# Patient Record
Sex: Male | Born: 1955 | Race: White | Hispanic: No | Smoking: Current every day smoker
Health system: Southern US, Community
[De-identification: ages and names within clinical notes are randomized; demographics above are authoritative.]

## PROBLEM LIST (undated history)

## (undated) DIAGNOSIS — F32A Depression, unspecified: Secondary | ICD-10-CM

## (undated) DIAGNOSIS — M549 Dorsalgia, unspecified: Secondary | ICD-10-CM

## (undated) DIAGNOSIS — J449 Chronic obstructive pulmonary disease, unspecified: Secondary | ICD-10-CM

## (undated) DIAGNOSIS — H538 Other visual disturbances: Secondary | ICD-10-CM

## (undated) DIAGNOSIS — G8929 Other chronic pain: Secondary | ICD-10-CM

## (undated) DIAGNOSIS — M1990 Unspecified osteoarthritis, unspecified site: Secondary | ICD-10-CM

## (undated) DIAGNOSIS — M109 Gout, unspecified: Secondary | ICD-10-CM

## (undated) DIAGNOSIS — K219 Gastro-esophageal reflux disease without esophagitis: Secondary | ICD-10-CM

## (undated) DIAGNOSIS — G4730 Sleep apnea, unspecified: Secondary | ICD-10-CM

## (undated) DIAGNOSIS — I10 Essential (primary) hypertension: Secondary | ICD-10-CM

## (undated) DIAGNOSIS — F329 Major depressive disorder, single episode, unspecified: Secondary | ICD-10-CM

## (undated) DIAGNOSIS — E782 Mixed hyperlipidemia: Secondary | ICD-10-CM

## (undated) DIAGNOSIS — C6290 Malignant neoplasm of unspecified testis, unspecified whether descended or undescended: Secondary | ICD-10-CM

## (undated) DIAGNOSIS — K589 Irritable bowel syndrome without diarrhea: Secondary | ICD-10-CM

## (undated) DIAGNOSIS — I2510 Atherosclerotic heart disease of native coronary artery without angina pectoris: Secondary | ICD-10-CM

## (undated) HISTORY — DX: Gout, unspecified: M10.9

## (undated) HISTORY — PX: SURGERY SCROTAL / TESTICULAR: SUR1316

## (undated) HISTORY — DX: Mixed hyperlipidemia: E78.2

## (undated) HISTORY — DX: Other visual disturbances: H53.8

## (undated) HISTORY — DX: Atherosclerotic heart disease of native coronary artery without angina pectoris: I25.10

## (undated) HISTORY — DX: Unspecified osteoarthritis, unspecified site: M19.90

## (undated) HISTORY — DX: Dorsalgia, unspecified: M54.9

## (undated) HISTORY — DX: Irritable bowel syndrome, unspecified: K58.9

## (undated) HISTORY — DX: Chronic obstructive pulmonary disease, unspecified: J44.9

## (undated) HISTORY — DX: Sleep apnea, unspecified: G47.30

## (undated) HISTORY — PX: CHOLECYSTECTOMY: SHX55

## (undated) HISTORY — DX: Other chronic pain: G89.29

## (undated) HISTORY — PX: FOOT SURGERY: SHX648

## (undated) HISTORY — DX: Malignant neoplasm of unspecified testis, unspecified whether descended or undescended: C62.90

---

## 2000-02-17 ENCOUNTER — Ambulatory Visit (HOSPITAL_COMMUNITY): Admission: RE | Admit: 2000-02-17 | Discharge: 2000-02-17 | Payer: Self-pay | Admitting: Radiology

## 2000-02-17 ENCOUNTER — Encounter: Payer: Self-pay | Admitting: Preventative Medicine

## 2000-03-02 ENCOUNTER — Encounter: Payer: Self-pay | Admitting: Preventative Medicine

## 2000-03-02 ENCOUNTER — Ambulatory Visit (HOSPITAL_COMMUNITY): Admission: RE | Admit: 2000-03-02 | Discharge: 2000-03-02 | Payer: Self-pay | Admitting: Preventative Medicine

## 2000-03-16 ENCOUNTER — Encounter: Payer: Self-pay | Admitting: Preventative Medicine

## 2000-03-16 ENCOUNTER — Ambulatory Visit (HOSPITAL_COMMUNITY): Admission: RE | Admit: 2000-03-16 | Discharge: 2000-03-16 | Payer: Self-pay | Admitting: Preventative Medicine

## 2001-03-25 ENCOUNTER — Encounter (HOSPITAL_COMMUNITY): Admission: RE | Admit: 2001-03-25 | Discharge: 2001-04-24 | Payer: Self-pay | Admitting: Oncology

## 2001-03-25 ENCOUNTER — Encounter: Admission: RE | Admit: 2001-03-25 | Discharge: 2001-03-25 | Payer: Self-pay | Admitting: Oncology

## 2001-04-01 ENCOUNTER — Encounter (HOSPITAL_COMMUNITY): Payer: Self-pay | Admitting: Oncology

## 2001-04-26 ENCOUNTER — Encounter: Admission: RE | Admit: 2001-04-26 | Discharge: 2001-04-26 | Payer: Self-pay | Admitting: Oncology

## 2001-04-26 ENCOUNTER — Encounter (HOSPITAL_COMMUNITY): Admission: RE | Admit: 2001-04-26 | Discharge: 2001-05-26 | Payer: Self-pay | Admitting: Oncology

## 2001-06-25 ENCOUNTER — Encounter (HOSPITAL_COMMUNITY): Admission: RE | Admit: 2001-06-25 | Discharge: 2001-07-25 | Payer: Self-pay | Admitting: Oncology

## 2001-06-25 ENCOUNTER — Encounter: Admission: RE | Admit: 2001-06-25 | Discharge: 2001-06-25 | Payer: Self-pay | Admitting: Oncology

## 2001-07-28 ENCOUNTER — Encounter (HOSPITAL_COMMUNITY): Admission: RE | Admit: 2001-07-28 | Discharge: 2001-08-27 | Payer: Self-pay | Admitting: Oncology

## 2001-07-28 ENCOUNTER — Encounter: Admission: RE | Admit: 2001-07-28 | Discharge: 2001-07-28 | Payer: Self-pay | Admitting: Oncology

## 2001-09-30 ENCOUNTER — Encounter (HOSPITAL_COMMUNITY): Payer: Self-pay | Admitting: Oncology

## 2001-09-30 ENCOUNTER — Encounter (HOSPITAL_COMMUNITY): Admission: RE | Admit: 2001-09-30 | Discharge: 2001-10-30 | Payer: Self-pay | Admitting: Oncology

## 2002-04-04 ENCOUNTER — Encounter: Admission: RE | Admit: 2002-04-04 | Discharge: 2002-04-04 | Payer: Self-pay | Admitting: Oncology

## 2002-04-04 ENCOUNTER — Encounter (HOSPITAL_COMMUNITY): Admission: RE | Admit: 2002-04-04 | Discharge: 2002-05-04 | Payer: Self-pay | Admitting: Oncology

## 2002-04-06 ENCOUNTER — Encounter (HOSPITAL_COMMUNITY): Payer: Self-pay | Admitting: Oncology

## 2002-05-18 ENCOUNTER — Ambulatory Visit (HOSPITAL_COMMUNITY): Admission: RE | Admit: 2002-05-18 | Discharge: 2002-05-18 | Payer: Self-pay | Admitting: *Deleted

## 2002-07-07 ENCOUNTER — Encounter: Admission: RE | Admit: 2002-07-07 | Discharge: 2002-07-07 | Payer: Self-pay | Admitting: Oncology

## 2002-07-07 ENCOUNTER — Encounter (HOSPITAL_COMMUNITY): Admission: RE | Admit: 2002-07-07 | Discharge: 2002-08-06 | Payer: Self-pay | Admitting: Oncology

## 2002-10-05 ENCOUNTER — Encounter (HOSPITAL_COMMUNITY): Payer: Self-pay | Admitting: Oncology

## 2002-10-05 ENCOUNTER — Ambulatory Visit (HOSPITAL_COMMUNITY): Admission: RE | Admit: 2002-10-05 | Discharge: 2002-10-05 | Payer: Self-pay | Admitting: Oncology

## 2002-10-10 ENCOUNTER — Encounter (HOSPITAL_COMMUNITY): Admission: RE | Admit: 2002-10-10 | Discharge: 2002-11-09 | Payer: Self-pay | Admitting: Oncology

## 2002-10-10 ENCOUNTER — Encounter: Admission: RE | Admit: 2002-10-10 | Discharge: 2002-10-10 | Payer: Self-pay | Admitting: Oncology

## 2002-12-01 HISTORY — PX: CORONARY ANGIOPLASTY WITH STENT PLACEMENT: SHX49

## 2002-12-05 ENCOUNTER — Encounter: Admission: RE | Admit: 2002-12-05 | Discharge: 2002-12-05 | Payer: Self-pay | Admitting: Oncology

## 2002-12-05 ENCOUNTER — Encounter (HOSPITAL_COMMUNITY): Admission: RE | Admit: 2002-12-05 | Discharge: 2003-01-04 | Payer: Self-pay | Admitting: Oncology

## 2002-12-05 ENCOUNTER — Encounter (HOSPITAL_COMMUNITY): Payer: Self-pay | Admitting: Oncology

## 2003-03-06 ENCOUNTER — Encounter: Admission: RE | Admit: 2003-03-06 | Discharge: 2003-03-06 | Payer: Self-pay | Admitting: Oncology

## 2003-03-06 ENCOUNTER — Encounter (HOSPITAL_COMMUNITY): Admission: RE | Admit: 2003-03-06 | Discharge: 2003-04-05 | Payer: Self-pay | Admitting: Oncology

## 2003-03-17 ENCOUNTER — Encounter: Payer: Self-pay | Admitting: *Deleted

## 2003-03-17 ENCOUNTER — Inpatient Hospital Stay (HOSPITAL_COMMUNITY): Admission: EM | Admit: 2003-03-17 | Discharge: 2003-03-22 | Payer: Self-pay | Admitting: Cardiology

## 2003-04-20 ENCOUNTER — Encounter: Payer: Self-pay | Admitting: Emergency Medicine

## 2003-04-20 ENCOUNTER — Inpatient Hospital Stay (HOSPITAL_COMMUNITY): Admission: EM | Admit: 2003-04-20 | Discharge: 2003-04-22 | Payer: Self-pay | Admitting: Cardiology

## 2003-04-25 ENCOUNTER — Encounter (HOSPITAL_COMMUNITY): Admission: RE | Admit: 2003-04-25 | Discharge: 2003-05-25 | Payer: Self-pay | Admitting: Cardiology

## 2003-06-02 ENCOUNTER — Encounter: Admission: RE | Admit: 2003-06-02 | Discharge: 2003-06-02 | Payer: Self-pay | Admitting: Oncology

## 2003-06-02 ENCOUNTER — Encounter (HOSPITAL_COMMUNITY): Admission: RE | Admit: 2003-06-02 | Discharge: 2003-07-02 | Payer: Self-pay | Admitting: Oncology

## 2003-06-06 ENCOUNTER — Encounter (HOSPITAL_COMMUNITY): Payer: Self-pay | Admitting: Oncology

## 2003-06-14 ENCOUNTER — Ambulatory Visit (HOSPITAL_COMMUNITY): Admission: RE | Admit: 2003-06-14 | Discharge: 2003-06-15 | Payer: Self-pay | Admitting: *Deleted

## 2003-06-23 ENCOUNTER — Encounter (HOSPITAL_COMMUNITY): Admission: RE | Admit: 2003-06-23 | Discharge: 2003-07-23 | Payer: Self-pay | Admitting: *Deleted

## 2003-06-23 ENCOUNTER — Observation Stay (HOSPITAL_COMMUNITY): Admission: AD | Admit: 2003-06-23 | Discharge: 2003-06-23 | Payer: Self-pay | Admitting: Pulmonary Disease

## 2003-08-04 ENCOUNTER — Emergency Department (HOSPITAL_COMMUNITY): Admission: EM | Admit: 2003-08-04 | Discharge: 2003-08-04 | Payer: Self-pay | Admitting: Emergency Medicine

## 2003-08-21 ENCOUNTER — Ambulatory Visit: Admission: RE | Admit: 2003-08-21 | Discharge: 2003-08-21 | Payer: Self-pay | Admitting: Pulmonary Disease

## 2003-09-13 ENCOUNTER — Encounter (HOSPITAL_COMMUNITY): Admission: RE | Admit: 2003-09-13 | Discharge: 2003-10-13 | Payer: Self-pay | Admitting: Oncology

## 2003-09-15 ENCOUNTER — Encounter: Payer: Self-pay | Admitting: Emergency Medicine

## 2003-09-16 ENCOUNTER — Observation Stay (HOSPITAL_COMMUNITY): Admission: EM | Admit: 2003-09-16 | Discharge: 2003-09-16 | Payer: Self-pay | Admitting: Emergency Medicine

## 2003-11-22 ENCOUNTER — Ambulatory Visit (HOSPITAL_COMMUNITY): Admission: RE | Admit: 2003-11-22 | Discharge: 2003-11-22 | Payer: Self-pay | Admitting: Orthopaedic Surgery

## 2003-12-08 ENCOUNTER — Encounter: Admission: RE | Admit: 2003-12-08 | Discharge: 2003-12-08 | Payer: Self-pay | Admitting: Oncology

## 2003-12-08 ENCOUNTER — Encounter (HOSPITAL_COMMUNITY): Admission: RE | Admit: 2003-12-08 | Discharge: 2004-01-07 | Payer: Self-pay | Admitting: Oncology

## 2004-02-11 ENCOUNTER — Inpatient Hospital Stay (HOSPITAL_COMMUNITY): Admission: EM | Admit: 2004-02-11 | Discharge: 2004-02-13 | Payer: Self-pay | Admitting: Emergency Medicine

## 2004-03-16 ENCOUNTER — Emergency Department (HOSPITAL_COMMUNITY): Admission: EM | Admit: 2004-03-16 | Discharge: 2004-03-16 | Payer: Self-pay | Admitting: Emergency Medicine

## 2004-04-24 ENCOUNTER — Ambulatory Visit (HOSPITAL_COMMUNITY): Admission: RE | Admit: 2004-04-24 | Discharge: 2004-04-24 | Payer: Self-pay | Admitting: Pulmonary Disease

## 2004-07-08 ENCOUNTER — Encounter: Admission: RE | Admit: 2004-07-08 | Discharge: 2004-07-08 | Payer: Self-pay | Admitting: Oncology

## 2004-07-08 ENCOUNTER — Encounter (HOSPITAL_COMMUNITY): Admission: RE | Admit: 2004-07-08 | Discharge: 2004-08-07 | Payer: Self-pay | Admitting: Oncology

## 2004-08-27 ENCOUNTER — Ambulatory Visit (HOSPITAL_COMMUNITY): Admission: RE | Admit: 2004-08-27 | Discharge: 2004-08-27 | Payer: Self-pay | Admitting: Pulmonary Disease

## 2004-09-18 ENCOUNTER — Observation Stay (HOSPITAL_COMMUNITY): Admission: EM | Admit: 2004-09-18 | Discharge: 2004-09-19 | Payer: Self-pay | Admitting: Emergency Medicine

## 2004-10-09 ENCOUNTER — Ambulatory Visit (HOSPITAL_COMMUNITY): Admission: RE | Admit: 2004-10-09 | Discharge: 2004-10-09 | Payer: Self-pay | Admitting: Neurology

## 2004-11-15 ENCOUNTER — Ambulatory Visit: Payer: Self-pay | Admitting: *Deleted

## 2005-01-08 ENCOUNTER — Ambulatory Visit (HOSPITAL_COMMUNITY): Admission: RE | Admit: 2005-01-08 | Discharge: 2005-01-08 | Payer: Self-pay | Admitting: Oncology

## 2005-01-10 ENCOUNTER — Encounter (HOSPITAL_COMMUNITY): Admission: RE | Admit: 2005-01-10 | Discharge: 2005-02-09 | Payer: Self-pay | Admitting: Oncology

## 2005-01-10 ENCOUNTER — Encounter: Admission: RE | Admit: 2005-01-10 | Discharge: 2005-01-10 | Payer: Self-pay | Admitting: Oncology

## 2005-01-10 ENCOUNTER — Ambulatory Visit (HOSPITAL_COMMUNITY): Payer: Self-pay | Admitting: Oncology

## 2005-06-21 ENCOUNTER — Ambulatory Visit: Payer: Self-pay | Admitting: Internal Medicine

## 2005-06-21 ENCOUNTER — Observation Stay (HOSPITAL_COMMUNITY): Admission: AD | Admit: 2005-06-21 | Discharge: 2005-06-23 | Payer: Self-pay | Admitting: Internal Medicine

## 2005-07-10 ENCOUNTER — Encounter: Admission: RE | Admit: 2005-07-10 | Discharge: 2005-07-10 | Payer: Self-pay | Admitting: Oncology

## 2005-07-10 ENCOUNTER — Ambulatory Visit: Payer: Self-pay | Admitting: *Deleted

## 2005-07-10 ENCOUNTER — Encounter (HOSPITAL_COMMUNITY): Admission: RE | Admit: 2005-07-10 | Discharge: 2005-08-09 | Payer: Self-pay | Admitting: Oncology

## 2005-07-10 ENCOUNTER — Ambulatory Visit (HOSPITAL_COMMUNITY): Payer: Self-pay | Admitting: Oncology

## 2005-12-08 ENCOUNTER — Ambulatory Visit (HOSPITAL_COMMUNITY): Admission: RE | Admit: 2005-12-08 | Discharge: 2005-12-08 | Payer: Self-pay | Admitting: Oncology

## 2005-12-25 ENCOUNTER — Ambulatory Visit (HOSPITAL_COMMUNITY): Payer: Self-pay | Admitting: Oncology

## 2005-12-25 ENCOUNTER — Encounter: Admission: RE | Admit: 2005-12-25 | Discharge: 2005-12-25 | Payer: Self-pay | Admitting: Oncology

## 2005-12-25 ENCOUNTER — Encounter (HOSPITAL_COMMUNITY): Admission: RE | Admit: 2005-12-25 | Discharge: 2006-01-24 | Payer: Self-pay | Admitting: Oncology

## 2006-06-11 ENCOUNTER — Encounter (HOSPITAL_COMMUNITY): Admission: RE | Admit: 2006-06-11 | Discharge: 2006-07-11 | Payer: Self-pay | Admitting: Oncology

## 2006-06-11 ENCOUNTER — Ambulatory Visit (HOSPITAL_COMMUNITY): Payer: Self-pay | Admitting: Oncology

## 2006-06-11 ENCOUNTER — Encounter: Admission: RE | Admit: 2006-06-11 | Discharge: 2006-06-11 | Payer: Self-pay | Admitting: Oncology

## 2006-07-20 ENCOUNTER — Encounter (HOSPITAL_COMMUNITY): Admission: RE | Admit: 2006-07-20 | Discharge: 2006-08-19 | Payer: Self-pay | Admitting: Oncology

## 2006-07-20 ENCOUNTER — Encounter: Admission: RE | Admit: 2006-07-20 | Discharge: 2006-07-20 | Payer: Self-pay | Admitting: Oncology

## 2007-07-19 ENCOUNTER — Ambulatory Visit (HOSPITAL_COMMUNITY): Payer: Self-pay | Admitting: Oncology

## 2007-07-19 ENCOUNTER — Encounter (HOSPITAL_COMMUNITY): Admission: RE | Admit: 2007-07-19 | Discharge: 2007-08-18 | Payer: Self-pay | Admitting: Oncology

## 2007-12-02 HISTORY — PX: CARDIAC CATHETERIZATION: SHX172

## 2008-02-25 ENCOUNTER — Ambulatory Visit (HOSPITAL_COMMUNITY): Admission: RE | Admit: 2008-02-25 | Discharge: 2008-02-25 | Payer: Self-pay | Admitting: Pulmonary Disease

## 2008-03-06 ENCOUNTER — Ambulatory Visit (HOSPITAL_COMMUNITY): Admission: RE | Admit: 2008-03-06 | Discharge: 2008-03-06 | Payer: Self-pay | Admitting: *Deleted

## 2009-10-15 ENCOUNTER — Ambulatory Visit (HOSPITAL_COMMUNITY): Admission: RE | Admit: 2009-10-15 | Discharge: 2009-10-15 | Payer: Self-pay | Admitting: Pulmonary Disease

## 2009-10-30 ENCOUNTER — Ambulatory Visit (HOSPITAL_COMMUNITY): Admission: RE | Admit: 2009-10-30 | Discharge: 2009-10-30 | Payer: Self-pay | Admitting: Neurology

## 2010-02-04 ENCOUNTER — Ambulatory Visit (HOSPITAL_COMMUNITY): Admission: RE | Admit: 2010-02-04 | Discharge: 2010-02-04 | Payer: Self-pay | Admitting: Pulmonary Disease

## 2010-04-29 ENCOUNTER — Emergency Department (HOSPITAL_COMMUNITY): Admission: EM | Admit: 2010-04-29 | Discharge: 2010-04-29 | Payer: Self-pay | Admitting: Emergency Medicine

## 2010-05-31 ENCOUNTER — Emergency Department (HOSPITAL_COMMUNITY): Admission: EM | Admit: 2010-05-31 | Discharge: 2010-05-31 | Payer: Self-pay | Admitting: Emergency Medicine

## 2010-09-02 ENCOUNTER — Ambulatory Visit (HOSPITAL_COMMUNITY): Admission: RE | Admit: 2010-09-02 | Discharge: 2010-09-02 | Payer: Self-pay | Admitting: Pulmonary Disease

## 2010-12-21 ENCOUNTER — Encounter: Payer: Self-pay | Admitting: Neurology

## 2011-01-23 ENCOUNTER — Other Ambulatory Visit (HOSPITAL_COMMUNITY): Payer: Self-pay | Admitting: Pulmonary Disease

## 2011-01-23 ENCOUNTER — Ambulatory Visit (HOSPITAL_COMMUNITY)
Admission: RE | Admit: 2011-01-23 | Discharge: 2011-01-23 | Disposition: A | Discharge status: Home / Self Care | Payer: BC Managed Care – PPO | Source: Ambulatory Visit | Attending: Pulmonary Disease | Admitting: Pulmonary Disease

## 2011-01-23 DIAGNOSIS — M25512 Pain in left shoulder: Secondary | ICD-10-CM

## 2011-01-23 DIAGNOSIS — M25519 Pain in unspecified shoulder: Secondary | ICD-10-CM | POA: Insufficient documentation

## 2011-04-18 NOTE — H&P (Signed)
NAMEBREVON, DEWALD NO.:  [id number]   MEDICAL RECORD NO.:  [id number]          PATIENT TYPE:  INP   LOCATION:  3705                         FACILITY:  [facility]   PHYSICIAN:  Arvilla Meres, M.D. LHCDATE OF BIRTH:  August 09, 1956   DATE OF ADMISSION:  06/21/2005  DATE OF DISCHARGE:                                HISTORY & PHYSICAL   PRIMARY CARE PHYSICIAN:  Edward L. Juanetta Gosling, M.D.   CARDIOLOGIST:  [name], M.D.   PATIENT IDENTIFICATION:  Mr. Silveria is a 55 year old with multiple medical  problems, including coronary artery disease, status post multiple previous  stents.  Also has a history of noncardiac chest pain, for which he has had  multiple admissions.  He was transferred from [facility] for  further evaluation of progressive chest pain and rash.   HISTORY OF PRESENT ILLNESS:  Mr. Nadal experienced acute inferior wall  myocardial infarction on March 17, 2003.  This was treated with PTCA and  stenting of his mid RCA with a Taxus drug-eluting stent.  At the time, he  also had evidence of significant left circumflex disease and underwent stage  intervention on March 20, 2003.  During this procedure, he had a stent  placed to the mid left circumflex.  This was complicated by a dissection and  abrupt closure requiring bail-out stenting with a Pixel stent x2.  Several  months later, he experienced recurrent chest pain and in July, 2004,  underwent repeat catheterization, which showed 70% stenosis just distal to  the previously placed stents in the left circumflex.  This was angioplastied  and stented by Dr. Chales Abrahams, using Cypher drug-eluting stent.  At that time,  his EF was 55%.  He had just minor disease in the LAD with a 20% stenosis.  There was 30% stenosis in the proximal circumflex and 20-30% stenosis in the  RCA proper.  The RV branch had a 95% stenosis.   This evening, he went to [facility] with several complaints.  He  states  that he has had about a year and a half of exertional chest pain,  which is perhaps slightly progressive.  He denies any rest pain and says he  only gets the chest discomfort with moderate activity such as walking up  hills.  It typically radiates to his jaw and his left arm, and it is  resolved with nitroglycerin.  He is also complaining of a mild rash that he  has on his arms and trunk.  He was evaluated by Dr. Margretta Ditty at [facility].  His EKG was not acute, and his first set of cardiac markers were  negative; however, given the progressive nature of his chest pain and his  response to nitroglycerin, he was transferred here for further evaluation.  While in the ambulate, he did have an episode of recurrent chest pain, which  was resolved with morphine.  He is currently chest painfree.   REVIEW OF SYSTEMS:  He has multiple somatic complaints, including recurrent  rash.  He also notes a chronic history of left-sided facial weakness, for  what he was told he may have had some mini strokes, but he is unsure.  He  also has a history of sleep apnea.  He complains of weakness in his  extremities.  He denies claudication.  He has not had any heart failure  symptoms.  There has been no bleeding, melena, or bright red blood per  rectum.  He has not had any recent fevers or chills, although he does  complain of a lingering upper respiratory tract infection.  The rest of the  review of systems are negative, except as per HPI and problem list.   PROBLEM LIST:  1.  Coronary artery disease:  As per HPI.  2.  History of noncardiac chest pain with multiple admissions.      1.  Adenosine Cardiolite in March, 2005 with an EF of 57% with no          ischemia or scar.  3.  History of testicular cancer.      1.  PET scan in February, 2006, which did not show any recurrent          disease.  4.  Hypertension.  5.  Obstructive sleep apnea, status post UPPT.  6.  Hyperlipidemia.  7.   Thrombocytopenia.  8.  Tobacco use, ongoing, 1-1/2 to 2 packs per day x30 years.  9.  History of left facial numbness x1 year.   CURRENT MEDICATIONS:  1.  Cardizem 180 mg daily.  2.  Naprosyn 500 mg b.i.d.  3.  Zocor 40 mg q.h.s.  4.  Folic acid 1 mg daily.  5.  Potassium 20 mEq b.i.d.  6.  Altace 5 mg daily.  7.  Zyrtec 10 mg daily.  8.  Nitroglycerin p.r.n.  9.  Protonix 40 mg daily.  10. Topamax 25 mg b.i.d.  11. Plavix 75 mg daily.  12. Aspirin 81 mg daily.   He has no known drug allergies.   SOCIAL HISTORY:  He lives in [place].  He is currently on disability.  He  is married and has two children of his own and two stepchildren.  He has  smoked 1-1/2 to 2 packs per day for about 30 years.  He denies significant  alcohol use.   FAMILY HISTORY:  Father had an MI at age 81 and died after a stroke at age  53.  His mother had coronary artery disease in her 59s.   PHYSICAL EXAMINATION:  VITAL SIGNS:  He is afebrile.  Blood pressure is  117/67 with a heart rate of 69.  His respirations are 16.  He is satting 99%  on room air.  GENERAL:  He is in no acute distress.  His respirations are unlabored.  He  is able to ambulate around the room without any difficulty.  HEENT:  Sclerae are anicteric.  EOMI.  There are no xanthelasmas.  Mucous  membranes are moist.  NECK:  Supple.  There is no JVD.  Carotids are 2+ bilaterally.  No carotid  bruits.  There is no adenopathy or thyromegaly.  LUNGS:  Clear to auscultation with a mildly prolonged expiratory phase.  There are no wheezes or rales.  HEART:  Regular rate and rhythm with no murmurs, rubs or gallops.  ABDOMEN:  Soft, nontender, nondistended.  There is no hepatosplenomegaly.  There are good bowel sounds.  There are no obvious masses or bruits.  His  abdominal aortic impulse does not appear widened.  EXTREMITIES:  No clubbing, cyanosis or edema.  Femoral  pulses are 2+ bilaterally without any bruits.  Distal pulses are 2+  bilaterally.  He has  just a fine macular rash on his wrist and the right side of his trunk.  This  appears to be resolving.  NEUROLOGIC:  He is alert and oriented x3.  Cranial nerves II-XII are intact.  He moves all four extremities without difficulty.  His affect is  appropriate.  He is otherwise nonfocal.   LABS:  White count 9.9, hemoglobin 15.8 with a hematocrit of 43.7.  Platelets 146,000.  Sodium 137, potassium 3.3, chloride 109, bicarb 20, BUN  14, creatinine 1.1, glucose 151.  Liver panel is normal.  BNP is less than  30.   EKG shows normal sinus rhythm at a rate of 86 with no ST/T wave changes.   ASSESSMENT/PLAN:  1.  Progressive chest pain:  This is concerning for underlying myocardial      ischemia.  We will admit him for rule-out myocardial infarction and for      cardiac catheterization.  In the interim, we will treat him with      heparin, nitroglycerin, calcium channel blockers, aspirin, Plavix, and      statin.  We will also request smoking cessation consult.  2.  His rash appears mild.  Of unclear etiology but does appear to be      resolving.  We will follow this.       DB/MEDQ  D:  06/22/2005  T:  06/22/2005  Job:  [id number]   cc:   Ramon Dredge L. Juanetta Gosling, M.D.  [address]  [place]  [place] [address]  Fax: [phone]   [name], M.D.  Fax: [phone]

## 2011-04-18 NOTE — Group Therapy Note (Signed)
NAME:  DARROW, BARREIRO                          ACCOUNT NO.:  [id number]   MEDICAL RECORD NO.:  [id number]                   PATIENT TYPE:  INP   LOCATION:  IC08                                 FACILITY:  [facility]   PHYSICIAN:  Edward L. Juanetta Gosling, M.D.             DATE OF BIRTH:  05-17-1956   DATE OF PROCEDURE:  02/13/2004  DATE OF DISCHARGE:                                   PROGRESS NOTE   PROBLEM:  Chest pain.   SUBJECTIVE:  Mr. Kingsley says he has had no more chest pain overnight.  He  is feeling pretty well.  He is not short of breath, has not got any other  complaints.   OBJECTIVE:  His physical exam shows his blood pressure 103/68, pulse is 65.  His chest is clear, his heart is regular.   ASSESSMENT:  He seems to be doing better.   PLAN:  Continue with his treatments and medications and add the stress test  today.      ___________________________________________                                            [name], M.D.   ELH/MEDQ  D:  02/13/2004  T:  02/13/2004  Job:  [id number]

## 2011-04-18 NOTE — Procedures (Signed)
   NAME:  Daniel Fields, Daniel Fields NO.:  [id number]   MEDICAL RECORD NO.:  [id number]                   PATIENT TYPE:  ER   LOCATION:  2905                                 FACILITY:  [facility]   PHYSICIAN:  Edward L. Juanetta Gosling, M.D.             DATE OF BIRTH:  [birth year]   DATE OF PROCEDURE:  03/17/2003  DATE OF DISCHARGE:                                EKG INTERPRETATION   RESULTS:  The rhythm is sinus rhythm with a rate of 70's.  ST elevations  inferior suggestive of an acute inferior myocardial infarction.  There are  also reciprocal changes laterally.  Abnormal electrocardiogram.                                               [name], M.D.    ELH/MEDQ  D:  03/18/2003  T:  03/18/2003  Job:  [id number]

## 2011-04-18 NOTE — Procedures (Signed)
   NAME:  Daniel, Fields NO.:  [id number]   MEDICAL RECORD NO.:  [id number]                   PATIENT TYPE:  INP   LOCATION:  2905                                 FACILITY:  [facility]   PHYSICIAN:  Edward L. Juanetta Gosling, M.D.             DATE OF BIRTH:  1956/01/20   DATE OF PROCEDURE:  03/17/2003  DATE OF DISCHARGE:                                EKG INTERPRETATION   RESULTS:  The rhythm is sinus rhythm with rate of 70.  There is ST elevation  inferiorly suggesting acute myocardial infarction.  Clinical correlation is  suggestive.  Poor R wave progression across the precordium may be indicative  of previous acute anterior wall myocardial infarction and suggestive of  abnormal electrocardiogram.                                               Edward L. Juanetta Gosling, M.D.    ELH/MEDQ  D:  03/18/2003  T:  03/18/2003  Job:  [id number]

## 2011-04-18 NOTE — H&P (Signed)
NAME:  Daniel Fields, Daniel Fields NO.:  [id number]   MEDICAL RECORD NO.:  [id number]          PATIENT TYPE:  INP   LOCATION:  IC09                          FACILITY:  [facility]   PHYSICIAN:  Melvyn Novas, MDDATE OF BIRTH:  11/09/1956   DATE OF ADMISSION:  09/18/2004  DATE OF DISCHARGE:  LH                                HISTORY & PHYSICAL   HISTORY OF PRESENT ILLNESS:  The patient is a 55 year old white male with  history of coronary artery disease, status post stenting x3 in two vessels.  Apparently complains of a 10-day history of dull chest pain radiating to his  neck which persists for two hours, is not associated exertion.  Upon  questioning, responds firmly to associated nausea and dyspnea.  He does  complain of occasional palpitations.  However, he has slurred speech and  difficulty staying awake during the interview.  He denies any antecedent  head trauma.  The CT scan of his head was essentially negative for acute  episode done in the emergency room.  EKG shows poor R wave progression in  the precordium consistent with possible old anteroseptal infarct and he is  admitted for multiple reasons to get serial enzymes and EKGs, obtain an MRI  in the morning and a neurologic work-up to include carotid ultrasound, RPR,  TSH, drug screen and alcohol level tonight to figure out the etiology of his  sedation.  He is oriented in three spheres.   PAST MEDICAL HISTORY:  1.  Coronary artery disease, status post stenting x3 in two different      vessels.  2.  Status post MI.  3.  Hypertension.  4.  Degenerative joint disease.  5.  Gout.  6.  Obstructive sleep apnea.   PAST SURGICAL HISTORY:  Tonsillectomy and UPPP for obstructive sleep apnea.   ALLERGIES:  No known drug allergies.   HABITS:  Smokes one pack per day for 30 years. He does not imbibe alcohol.   CURRENT MEDICATIONS:  1.  Sinemet 25/100 t.i.d.  2.  Xanax 0.5 mg t.i.d.  3.  Altace 5 mg daily.  4.   Allopurinol 300 mg per day.  5.  Plavix 75 mg daily.  6.  Toprol-XL 50 mg per day.  7.  Naprosyn 500 mg b.i.d. p.r.n.  8.  Diltiazem 180 mg daily.  9.  Hydrocodone 5/500 q.i.d. p.r.n.  10. Zocor 40 mg per day.   PHYSICAL EXAMINATION:  VITAL SIGNS:  Blood pressure 113/75, pulse 68 and  regular, respiratory rate 18, temperature 97.7.  HEENT:  Normocephalic, atraumatic.  PERRL.  Extraocular movements intact.  Sclerae clear.  Conjunctivae pink.  The patient reluctantly follows all  commands very slowly.  Throat shows no erythema, no exudates.  NECK:  No carotid bruits.  No thyromegaly.  LUNGS:  Prolongation __________  phase.  No rales, wheezes, rhonchi  appreciable.  HEART:  Regular rhythm.  No murmur, gallops, heaves, thrills, or rubs.  ABDOMEN:  Soft, nontender.  Normal bowel sounds.  No guarding, rebound,  masses or megaly.  EXTREMITIES:  No clubbing, cyanosis or edema.  NEUROLOGIC:  The patient reluctantly follows all commands very slowly.  Cranial nerves II-XII grossly intact.  Finger-to-nose is very poor, both  right and left hand.  Plantar response is downgoing bilaterally.  The  patient moves all four extremities.  Deep tendon reflexes 1+ and symmetric.   IMPRESSION:  1.  Atypical chest pain in the face of significant coronary disease,      multiple stentings.  2.  Sedation with dysarthria of 10 days duration, etiology undetermined.  3.  Hypertension.  4.  Hyperlipidemia.  5.  Sleep apnea.   PLAN:  The plan is to admit and put him on IV nitroglycerin, aspirin and  Plavix.  Will not elect to anticoagulate him at present as I do not feel  this is typical chest pain.  Will get RPR, TSH, carotid ultrasound, repeat  MRI of the head in the morning with contrast.  Hold all sedating medicines  including Xanax, hydrocodone, etc.  Get drug screen and alcohol level at  present and I will make further recommendations as the data base expands.     Rich   RMD/MEDQ  D:  09/18/2004   T:  09/19/2004  Job:  [id number]

## 2011-04-18 NOTE — Group Therapy Note (Signed)
NAME:  Daniel Fields, Daniel Fields                          ACCOUNT NO.:  [id number]   MEDICAL RECORD NO.:  [id number]                   PATIENT TYPE:  INP   LOCATION:  IC08                                 FACILITY:  [facility]   PHYSICIAN:  Edward L. Juanetta Gosling, M.D.             DATE OF BIRTH:  1956/02/10   DATE OF PROCEDURE:  02/12/2004  DATE OF DISCHARGE:                                   PROGRESS NOTE   PROBLEMS:  1. Chest pain.  2. History of coronary artery disease status post myocardial infarction.   SUBJECTIVE:  Daniel Fields says he is okay this morning and has no new  complaints.  He has had no more chest pain today.   OBJECTIVE:  His physical examination shows that his pulse is 67, blood  pressure 113/71, his chest is clear, his heart is regular.  Cardiac panel  thus far negative.  His family says that he was acting funny like he was  high.  He did not have anything on his urine drug screen that would  suggest any illicit drug abuse.  His other laboratory work so far is pretty  unremarkable except for a slightly low sodium and a slightly low potassium.   ASSESSMENT:  He has chest pain.   PLAN:  The plan is to continue with treatments, have him evaluated by the  cardiology team.      ___________________________________________                                            [name], M.D.   ELH/MEDQ  D:  02/12/2004  T:  02/12/2004  Job:  [id number]

## 2011-04-18 NOTE — Cardiovascular Report (Signed)
NAMESHAINE, NEWMARK NO.:  [id number]   MEDICAL RECORD NO.:  [id number]          PATIENT TYPE:  INP   LOCATION:  3705                         FACILITY:  [facility]   PHYSICIAN:  Rollene Rotunda, M.D.   DATE OF BIRTH:  [birth year]   DATE OF PROCEDURE:  06/23/2005  DATE OF DISCHARGE:                              CARDIAC CATHETERIZATION   PRIMARY CARE PHYSICIAN:  Angus G. McInnis, MD   PROCEDURE:  Left heart catheterization/coronary arteriography.   INDICATIONS:  Evaluate patient with chest pain suggestive of unstable  angina. He has had previous stenting to his right coronary artery and  previous stenting to an obtuse marginal of his circumflex.   PROCEDURE NOTE:  Left heart catheterization was performed via the right  femoral artery. The artery was cannulated using anterior wall puncture. A #6  French arterial sheath was inserted via the modified Seldinger technique. A  preformed Judkins' and pigtail catheters were utilized. The patient  tolerated the procedure well and left the lab in stable condition.   RESULTS:   HEMODYNAMICS:  LV 114/25, AO 112/69. Coronaries: The left main was normal.  The LAD was large wrapping the apex. There was a proximal 25% stenosis.  There were diffuse luminal irregularities. The first diagonal was small with  ostial 25%. The second diagonal was small and normal. The circumflex and the  AV groove had a proximal 25% stenosis. There was a ramus intermedius which  was large with ostial 2% stenosis. The mid obtuse marginal with a stent that  was widely patent. The right coronary artery was a large dominant vessel.  There was a proximal mid stent which was widely patent. The PDA was moderate  size and normal. There was an acute marginal with ostial 90% stenosis. This  was unchanged from previous.   LEFT VENTRICULOGRAM:  The left ventriculogram was obtained in the RAO  projection. EF was 65% with normal wall motion.   CONCLUSION:  Mild  residual nonobstructive coronary disease other than the  acute marginal stenosis as described.  The stents are widely patent.   PLAN:  The patient should continue to be managed medically with aggressive  secondary risk reduction.       JH/MEDQ  D:  06/23/2005  T:  06/23/2005  Job:  [id number]   cc:   Angus G. Renard Matter, MD  [address]  [place]  [place] [address]  Fax: [phone]   [facility]  [place], [place]

## 2011-04-18 NOTE — Cardiovascular Report (Signed)
NAME:  BALDWIN, RACICOT                          ACCOUNT NO.:  [id number]   MEDICAL RECORD NO.:  [id number]                   PATIENT TYPE:  INP   LOCATION:  2019                                 FACILITY:  [facility]   PHYSICIAN:  Veneda Melter, M.D.                   DATE OF BIRTH:  [birth year]   DATE OF PROCEDURE:  04/21/2003  DATE OF DISCHARGE:                              CARDIAC CATHETERIZATION   PROCEDURES PERFORMED:  1. Left heart catheterization.  2. Left ventriculogram.  3. Selective coronary angiography.   DIAGNOSES:  1. Coronary atherosclerotic disease.  2. Normal left ventricular systolic function.   HISTORY:  Mr. Frayne is a 55 year old white male with history of tobacco  use who presents for assessment of exertional chest discomfort.  The patient  suffered an inferior wall myocardial infarction on March 17, 2003 and  underwent percutaneous intervention with stent placement of the right  coronary artery.  He had severe residual disease in the left circumflex  artery and underwent stage procedure on March 15, 2003.  A 3.0 x 20-mm Taxus  stent was placed in the RCA and a 2.5 x 18 as well as a 2.5 x 13-mm Pixel  stents were placed in the left circumflex artery.  The patient recovered  well from his procedure.  However, he has had exertional chest pressure and  discomfort with mild numbness in the left arm.  This was similar to his pain  on initial presentation only not as severe. The patient had increase  symptoms leading to admission to [facility] where plan is made for  stress imaging study.  However, due to prolonged episode of pain he was  transferred to [facility] for cardiac catheterization.  He has ruled out  for acute myocardial infarction.   TECHNIQUE:  Informed consent was obtained.  The patient brought to the  catheterization lab.  A 6 French sheath was placed in the right femoral  artery using the modified Seldinger technique.  A 6 [place] JL-4 and  JR-4  catheter was then used to engage the left and right coronary arteries and  selective angiography performed in various projections using manual  injection contrast.  A 6 French pigtail catheter was then advanced in the  left ventricle and a left ventriculogram performed using power injection  contrast.  After termination of this case, the catheters and sheaths were  removed and manual pressure applied until adequate hemostasis was achieved.  The patient tolerated the procedure well and was transferred to the floor in  stable condition.   FINDINGS:   LEFT HEART CATHETERIZATION:  1. Left main trunk:  Large-caliber vessel.  Angiographically normal.  2. LAD:  This is a large-caliber vessel that provides three diagonal     branches.  The LAD has diffuse disease of 20% in the proximal mid     sections.  The distal LAD has  mild irregularities.  The diagonal branches     have mild disease of 30% at their origins.  3. Left circumflex artery:  This is a medium-caliber vessel that consists of     a large marginal branch in the distal section.  The proximal AV     circumflex has moderate tortuosity and has disease of 20-30%.  The large     marginal branch has a previously placed stent in the proximal segment     that is widely patent.  The distal  section of the marginal branch     provides several subbranches.  There is a segment of marginal branch     after the stent and prior to the bifurcation that has moderate     tortuosity.  This pinch appears worse during systole with compromise of     the lumen to approximately 70%.  4. The right coronary artery is dominant.  This is a large-caliber vessel     that provides to posterior descending artery and several posterior     ventricular branches of the terminal segment.  The right coronary artery     has mild lesion in the proximal segment of 20-30%.  There is a stent in     the mid section of the vessel that is widely patent.  The distal  vessel     has mild irregularities.  The RV marginal branch has an ostial narrowing     of 95%.   LEFT VENTRICULOGRAPHY:  1. LV normal end-systolic and end-diastolic dimensions.  2. Overall left ventricular function is well preserved.  3. Ejection fraction greater than 55%.  4. No mitral regurgitation.  5. LV pressure is 90/10.  6. Aortic pressure is 90/60.  7. LVEDP equals 15.   ASSESSMENT AND PLAN:  Mr. Nixon is a 55 year old gentleman status post  stent placement to the right coronary artery as well as large marginal  branch and left circumflex artery.  These segments are widely patent.  There  is compromise of a RV marginal branch from the mid section of the RCA that  will be medically managed.  He also has borderline lesion of the distal  section of the marginal branch which is pinched due to vessel tortuosity and  probable straightening of the vessel due to the stent in the proximal left  segment.   Treatment options were discussed with the patient including percutaneous  intervention versus medical therapy.  At this point, he agrees to proceed  with medical therapy.  Should his symptoms persist or increase percutaneous  intervention with additional stent placement may be necessary.                                               Veneda Melter, M.D.    NG/MEDQ  D:  04/21/2003  T:  04/22/2003  Job:  [id number]   cc:   Ramon Dredge L. Juanetta Gosling, M.D.  [address]  [place]  [place] [address]  Fax: [phone]   [name], M.D.  Fax: [phone]

## 2011-04-18 NOTE — Discharge Summary (Signed)
NAME:  Daniel Fields, Daniel Fields                          ACCOUNT NO.:  [id number]   MEDICAL RECORD NO.:  [id number]                   PATIENT TYPE:  INP   LOCATION:  A218                                 FACILITY:  [facility]   PHYSICIAN:  Edward L. Juanetta Gosling, M.D.             DATE OF BIRTH:  [birth year]   DATE OF ADMISSION:  02/11/2004  DATE OF DISCHARGE:  02/13/2004                                 DISCHARGE SUMMARY   FINAL DISCHARGE DIAGNOSES:  1. Chest pain, myocardial infarction ruled out.  2. History of coronary artery occlusive disease, status post previous     myocardial infarction.  3. History of testicular cancer.  4. Hypertension.  5. Hyperlipidemia.  6. History of thrombocytopenia.  7. Gout.   HISTORY:  Ms. Livermore is a 55 year old who has presented to the emergency  room with chest pain.  He has had this for about 2- days off and on and he  was also somewhat confused according to __________.  He had been urged to  come to the emergency room for evaluation, but resisted it, but eventually  did decided to come to the emergency room and he was seen and treated in the  emergency room, but was not able to be discharged because he continued to  have pain.   PHYSICAL EXAMINATION:  GENERAL:  His physical exam on admission showed that  he was mildly confused.  CHEST:  His chest was fairly clear.  HEART:  His heart was regular.  ABDOMEN:  His abdomen was soft.  EXTREMITIES:  Showed no edema.   HOSPITAL COURSE:  He was treated in the intensive care unit, placed on  anticoagulation and improved. He had no further chest pain after the first  12 hours or so. He had cardiology consultation and was set up for a  Cardiolite stress test which he had and which was normal.  After his stress  test was found to be normal, I discussed this with him and he was discharged  home.  He understands that he may have more chest pain and may have more  problem; but, at this point, we cannot prove that he has any  ongoing cardiac  disease as far as any ongoing ischemia.  He is discharged home.   DISCHARGE MEDICATIONS:  1. Allopurinol 300 mg daily.  2. Aspirin 325 mg daily.  3. Plavix 75 mg daily.  4. Diltiazem 180 mg daily.  5. Cymbalta 60 mg daily.  6. Lopressor 75 mg daily.  7. Protonix 40 mg daily.  8. Zocor 40 mg daily.  9. Xanax 0.5 mg b.i.d. p.r.n.  10.      Lortab q.i.d. p.r.n. pain.   FOLLOW UP:  He is to follow up in my office and follow up with cardiology.     ___________________________________________  Edward L. Juanetta Gosling, M.D.   ELH/MEDQ  D:  02/13/2004  T:  02/14/2004  Job:  [id number]

## 2011-04-18 NOTE — H&P (Signed)
NAME:  Daniel Fields, Daniel Fields NO.:  [id number]   MEDICAL RECORD NO.:  [id number]                  PATIENT TYPE:   LOCATION:                                       FACILITY:   PHYSICIAN:  Angus G. Renard Matter, M.D.              DATE OF BIRTH:   DATE OF ADMISSION:  DATE OF DISCHARGE:                                HISTORY & PHYSICAL   HISTORY OF PRESENT ILLNESS:  A 55 year old white male who states that he had  developed anterior chest pain approximately two days ago which was  intermittent.  It was in the central part of the chest, radiating to the  neck, associated with nausea and weakness.  Apparently these symptoms had  been present since his heart catheterization approximately one year ago.  Apparently he had stents placed with cardiac catheterization at that time.  The patient had been having headaches, slurred speech.  He was seen by ED  physician and evaluated.   A CT of his head was done today and was negative.    The patient was subsequently admitted, rule out unstable angina.   LABORATORY DATA:  CBC, WBC 6100, hemoglobin 16.0, hematocrit 45.1%.  Alcohol  level less than 5 mg/dl.  Chemistry:  Sodium 131, potassium 3.3, chloride  97, cO2 38, glucose 99, BUN 11, creatinine 0.9, calcium 8.3.  CK total 59,  CK-MB 0.7.  Troponin 0.02.  Urinalysis negative.  Drug screen negative.   SOCIAL HISTORY:  The patient is a cigarette smoker.  Denies the use of  alcohol.   FAMILY HISTORY:  See previous record.   PAST MEDICAL HISTORY:  The patient has a history of hypertension,  hyperlipidemia, coronary artery disease, angioplasty and stents placed one  year ago in April following cardiac catheterization.  History of gout.   ALLERGIES:  No known allergies.   MEDICATIONS:  1. Zocor.  2. Plavix.  3. Protonix.  4. Altace.  5. Allopurinol.  6. Diltiazem.  7. Toprol.  8. Aspirin.   REVIEW OF SYSTEMS:  HEENT:  Negative.  CARDIOPULMONARY:  Occasional cough.  Anterior chest pain as noted in HPI.  GI:  No abdominal pain, but  intermittent episodes of nausea.  GU:  No dysuria, hematuria.   PHYSICAL EXAMINATION:  GENERAL:  An alert white male.  VITAL SIGNS:  Blood pressure 117/77, respirations 20, pulse 65.  Temperature  ___.  HEENT:  Eyes:  PERRLA.  TM's are negative.  Oropharynx benign.  NECK:  Supple, no JVD or thyroid abnormalities.  LUNGS:  Clear to P&A.  HEART:  Regular rhythm, no murmurs.  ABDOMEN:  No palpable organs or masses.  SKIN:  Warm and dry.  EXTREMITIES:  Free of edema.  NEUROLOGIC:  No focal deficits.   DIAGNOSES:  1. Coronary artery disease with unstable angina.  2. Impaired mental status of undetermined etiology.     ___________________________________________  Angus G. Renard Matter, M.D.   AGM/MEDQ  D:  02/11/2004  T:  02/11/2004  Job:  [id number]

## 2011-04-18 NOTE — Consult Note (Signed)
NAME:  Daniel Fields, CAUL NO.:  [id number]   MEDICAL RECORD NO.:  [id number]                   PATIENT TYPE:  INP   LOCATION:  IC08                                 FACILITY:  [facility]   PHYSICIAN:  [name], M.D.                DATE OF BIRTH:  [birth year]   DATE OF CONSULTATION:  02/12/2004  DATE OF DISCHARGE:                                   CONSULTATION   CARDIOLOGY CONSULTATION   PRIMARY CARE Lan [place]minger:  [name], M.D.   HISTORY OF PRESENT ILLNESS:  Mr. Daniel Fields is a 55 year old man who I follow  relatively closely with coronary artery disease, hypertension,  hyperlipidemia, history of testicular cancer, and thrombocytopenia who  presented to the ER the day prior to evaluation complaining of chest  discomfort associated with fever, chills, difficulty urinating, nausea, and  feelings of unsteadiness that have occurred over the last couple of days,  most significantly on Saturday about 11:30.  He was riding his brother's  truck when he started to feel very nauseated; previously, that day before,  had some difficulty urinating, but now particularly difficulty with nausea,  a little bit of shortness of breath, some associated fevers, profuse  diaphoresis, and some discomfort in his chest and this waxed and waned over  the course of a period of time.  He presented to the emergency department  after he had gone home, taken some nitroglycerin and the symptoms had  resolved with the nitroglycerin; we were asked to evaluate him.  He is  currently asymptomatic.  He is not having any chest pain at all and is  resting comfortably in the ICU here at [facility]   MEDICATIONS CURRENTLY:  1. Allopurinol 300 mg once a day.  2. Aspirin 325 mg a day.  3. Plavix 75 mg a day.  4. Diltiazem 180 mg a day.  5. Cymbalta 16 mg a day.  6. Lovenox 1 mg/kilogram subcu b.i.d.  7. Lopressor 75 mg a day.  8. Protonix 40 mg a day.  9. __________ 5 mg a day.  10.       Zocor 40 mg a day.  11.      Xanax 0.5 mg b.i.d.  12.      Lortab p.r.n.   ALLERGIES:  He has no known allergies.   SOCIAL HISTORY:  He has continued to smoke even though he has had diagnosed  coronary disease.  He does not consume alcohol, does not use any illicit  drugs.  Lives in [place] with his wife.  His is disabled.   FAMILY HISTORY:  Does have a father who has coronary disease.   PAST MEDICAL HISTORY:  Significant for the hypertension and hyperlipidemia.  History of coronary disease, essentially a myocardial infarction in April  2004 and had a revascularization, acutely of his right coronary artery at  that time.  Subsequently had elective revascularization of  his obtuse  marginal which was found on his initial heart catheterization; and, recently  in July of last year had recurrent chest discomfort, had a residual 70%  stenosis in the obtuse marginal following the stent and then had PCI on this  as well.  Recently studied in July of 2004 which showed an ejection fraction  of 55% and no significant ischemia on a __________ study.  His most recent  heart catheterization was prior to that. He has also had a subsequent  perfusion study which was performed in October of this year after an  admission to the hospital at [facility].  Actually, he was admitted to [facility] for chest pain, back in October and the plan was for an  outpatient Cardiolite which he chose not to undergo.   REVIEW OF SYSTEMS:  He denies any headache, but does have the above other  symptoms.  No productive cough, no hemoptysis, no dysphagia, odynophagia,  melena or hematochezia.   PHYSICAL EXAMINATION:  VITAL SIGNS:  His pulse is 65; his respiratory rate  is 14.  He is 100% saturated on 2 liters nasal cannula. His blood pressure  is 130/70.  HEENT:  Examination of the head, eyes, ears, nose, and throat is  unremarkable.  NECK:  The neck is supple.  There is no jugular venous distention or  carotid  bruits.  CHEST:  His chest is clear to auscultation.  There is normal gas exchange.  CARDIOVASCULAR:  He has a regular rate and rhythm with a normal first and  second heart sounds.  There are no murmurs, rubs, or gallops noted.  ABDOMEN:  His abdomen is soft, nontender, normoactive bowel sounds.  GENITOURINARY AND RECTAL:  Exams are deferred.  EXTREMITIES:  Are without significant edema.  He has normal pulses  bilaterally.  NEUROLOGIC:  Neurologic exam to my evaluation is grossly intact.   EKG:  Ventricular rate 61, sinus rhythm, first degree AV block with a PR  interval of 216 milliseconds. The remainder of his intervals are normal.  He  has no ST-T wave changes concerning for ischemia.  He does have a Q wave in  lead III and a Q wave in AVF consistent with his old inferior wall  myocardial infarction, but no active ST-T wave changes concerning for  ischemia and this is compared to his admission and previous EKGs, there is  no significant change.   CHEST X-RAY:  His chest x-ray shows no significant acute, cardiopulmonary  disease.   LABORATORIES:  His white blood cell count is 6000 with an H&H of 16 and 45  with a platelet count of 128,000.  Sodium 131, potassium 3.3, chloride 97,  bicarb 28, BUN 11, creatinine 0.9, his blood sugar 99. He has 3 sets of  cardiac enzymes which are negative for acute myocardial infarction.  His PT  is 12.4, PTT is 31.  His urine drug screen shows positive for  benzodiazepines and opiates which he is prescribed both.   ASSESSMENT:  So my assessment is that this a gentleman who has chest pain  with known coronary artery disease.  His chest pain syndrome is  characteristic for his previous episodes of pain.  He does have the known  coronary disease, but he has had multiple evaluations in the past for  similar symptoms which have shown no evidence of ischemia.  Dysuria, however, is quite concerning.  This is a problem.  He has got the history  of  testicular  cancer.  He is followed by Dr. Jerre Simon for this problem.  From  what I understand, on talking with him, he has recently had his bladder  evaluated within the last year without any trouble; however, he has  concerning symptoms from the mental status changes and I wonder if this may  have exacerbated the episode of discomfort in his chest.  I think that it is  reasonable, at this point, from our point of view to evaluate his ischemic  burden with an adenosine Cardiolite which we will get scheduled tomorrow.  I  would stop his Lovenox, at this point, as it does not appear to be making a  significant difference.   The question becomes whether to get a urology consultation which I think is  probably reasonable as well as a neurologic consultation for the evaluation  of these mental status changes.  I will leave that up to his primary care  physician.  We will leave him on his other medications.      ___________________________________________                                            [name], M.D.   JH/MEDQ  D:  02/12/2004  T:  02/12/2004  Job:  [id number]

## 2011-04-18 NOTE — Discharge Summary (Signed)
NAME:  Daniel Fields, Daniel Fields                          ACCOUNT NO.:  [id number]   MEDICAL RECORD NO.:  [id number]                   PATIENT TYPE:  INP   LOCATION:  2019                                 FACILITY:  [facility]   PHYSICIAN:  Olga Millers, M.D.                DATE OF BIRTH:  28-Mar-1956   DATE OF ADMISSION:  04/20/2003  DATE OF DISCHARGE:  04/22/2003                           DISCHARGE SUMMARY - REFERRING   PROCEDURES:  Coronary angiogram, Apr 21, 2003.   REASON FOR ADMISSION:  Please refer to the dictated admission note.   LABORATORY DATA:  Sodium 139, potassium of 3.4, glucose 114, BUN 14,  creatinine 0.9.  Normal liver enzymes.  Normal WBC and hemoglobin with  decreased platelets of 103.  Normal cardiac markers (times two).  Lipid  profile:  Total cholesterol 94, triglycerides 185, HDL 29, LDL 28.  Negative  urinalysis.   Admission CXR:  No active disease.   HOSPITAL COURSE:  Following initial presentation to [facility]  emergency room for evaluation of recurrent chest pain, patient was  stabilized and transferred to [facility] for further evaluation.   Subsequent serial cardiac enzymes were all within normal limits.  Patient  was maintained on intravenous nitroglycerin and Lovenox with plans to  proceed with a diagnostic coronary angiogram.   Patient underwent coronary angiography, performed by Dr. Chales Abrahams, on Apr 21, 2003, (see attached report for full details), revealing widely patent stent  sites of both the CFX and RCA.  There was, however, a 70% lesion in the CFX,  just distal to the stent site.  No significant LAD disease noted.  LV gram  was normal.   Dr. Chales Abrahams recommended continued medical management and consideration of PCI  of the OM lesion if patient were to have persistent pain.   Patient was kept for overnight observation, cleared for discharge the  following morning in hemodynamically stable condition.  Right groin was  stable on examination.   At discharge, patient was instructed to remain on Plavix for the full six  months.  He had had recent stenting in April 2004.  Aspirin was cut down to  a baby dose, and patient was also placed on Protonix.   Mild hypokalemia was supplemented prior to discharge.   DISCHARGE MEDICATIONS:  1. Protonix 40 mg daily.  2. Plavix 75 mg daily (six months total).  3. Aspirin 81 mg daily.  4. Toprol XL 25 mg daily.  5. Zocor 40 mg q.h.s.  6. Altace 5 mg daily.  7. Nitrostat 0.4 mg p.r.n.    DISCHARGE INSTRUCTIONS:   ACTIVITY:  No heavy lifting/driving times two days.   DIET:  Low-fat/cholesterol diet.   SPECIAL INSTRUCTIONS:  1. Call the office if there is any swelling/bleeding in the groin.  2. Patient was instructed to stop smoking tobacco.   FOLLOW UP:  Patient is instructed to follow up with his primary  cardiologist, Dr. [name], at [facility], in  approximately four weeks; arrangements will be made through the office.  Of  note, patient was scheduled for a Cardiolite next Tuesday, which will be  cancelled and reconsidered and rescheduled for a later date, pending  followup and further evaluation.   DISCHARGE DIAGNOSES:  1. Nonischemic chest pain.     A. Normal serial cardiac enzymes.     B. Widely patent stent, circumflex and right coronary artery sites, with        a 70% distal obtuse marginal stenosis - medical therapy recommended.     C. Normal left ventricle.     D. Status post acute inferior myocardial infarction/stent to right        coronary artery with subsequent elective stent, obtuse marginal -        April 2004.  2. Dyslipidemia.  3. Hypertension.  4. Tobacco.  5. Mild thrombocytopenia.  6. Mild hypokalemia.     Gene Serpe, P.A. [facility]                      Olga Millers, M.D.    GS/MEDQ  D:  04/22/2003  T:  04/22/2003  Job:  [id number]   cc:   Ramon Dredge L. Juanetta Gosling, M.D.  [address]  [place]  [place] [address]  Fax: [phone]

## 2011-04-18 NOTE — Discharge Summary (Signed)
NAME:  Daniel Fields, Daniel Fields                          ACCOUNT NO.:  [id number]   MEDICAL RECORD NO.:  [id number]                   PATIENT TYPE:  INP   LOCATION:  2010                                 FACILITY:  [facility]   PHYSICIAN:  Olga Millers, M.D.                DATE OF BIRTH:  [birth year]   DATE OF ADMISSION:  09/15/2003  DATE OF DISCHARGE:  09/16/2003                           DISCHARGE SUMMARY - REFERRING   DISCHARGE DIAGNOSIS:  Admission with palpitations and chest pain accompanied  by dyspnea and feeling of fatigue.   SECONDARY DIAGNOSES:  1. History of coronary artery disease, status post myocardial infarction,     April of 2004, with four catheterizations since and multiple stent     placements.  2. Hypertension.  3. Dyslipidemia.  4. History of testicular cancer.  5. History of nodular opacities in the left lung.  6. History of percutaneous coronary intervention to the left circumflex and     to the right coronary artery.   PROCEDURES:  No procedures, this admission, however, his medication therapy  was changed; he was increased to Toprol 75 mg daily from 50 mg daily.  Cardiac enzymes x3 on admission to the emergency room were all negative.   DISCHARGE DISPOSITION:  Daniel Fields ready for discharge, September 16, 2003.  Since his admission to [facility], he has not had any more  of these palpitations which cause skipping and pain, thought to be related  to premature ventricular contractions.  His admission electrocardiogram does  show sinus rhythm with occasional PVCs.  The patient is stable.  He has been  in sinus rhythm, this hospitalization, afebrile, ready for discharge,  September 16, 2003, and goes home with the following medications:   DISCHARGE MEDICATIONS:  1. Enteric-coated aspirin 81 mg daily.  2. Plavix 75 mg daily.  3. Protonix 40 mg daily.  4. Altace 5 mg daily.  5. Toprol 75 mg daily -- one and one-half tabs of the 50 mg prescription.  6.  Allopurinol 300 mg daily.  7. Fish oil capsules 1000 mg twice daily.   DISCHARGE DIET:  Low sodium, low cholesterol.   FOLLOWUP:  He will follow up with Dr. [name] at the [place]  office in one week; Dr. Marchelle Folks office will call with that appointment.   BRIEF HISTORY:  Daniel Fields is a 55 year old male with past history of  coronary artery disease, status post stents as related above, history of  hypertension, dyslipidemia and testicular cancer.  He is complaining of  palpitations which cause chest pain and some dyspnea.  He had PCI of the  right coronary artery in April 2004 with finding of inferior myocardial  infarction.  He then subsequently had a PCI of the obtuse marginal in July  of 2004.  A Cardiolite was done on June 23, 2003; it showed ejection  fraction of 55% without evidence of ischemia.  The patient now presents with  occasional palpitations, a skip with associated pain; he also complains of  dyspnea on exertion but no exertional chest pain.  The symptoms are atypical  and most likely related to his PVCs.  He will be admitted to rule out  myocardial infarction and followed on telemetry.  If he has no significant  arrhythmia, he will be discharged the following morning and follow up with  Dr. Dorethea Clan.   PLAN:  1. Discharge September 16, 2003.  2. Follow up with Dr. Dorethea Clan in one week; our office will call.  3. Consider event monitor if symptoms persist.  4. Consider repeat Cardiolite study if symptoms persist.      Maple Mirza, P.A.                    Olga Millers, M.D.    GM/MEDQ  D:  09/16/2003  T:  09/16/2003  Job:  [id number]

## 2011-04-18 NOTE — Procedures (Signed)
NAME:  Daniel Fields, PEMBER NO.:  [id number]   MEDICAL RECORD NO.:  [id number]                   PATIENT TYPE:  INP   LOCATION:  IC08                                 FACILITY:  [facility]   PHYSICIAN:  [name], M.D.                DATE OF BIRTH:  06-18-1956   DATE OF PROCEDURE:  DATE OF DISCHARGE:                                    STRESS TEST   PROCEDURE:  Adenosine Cardiolite.   INDICATION:  Mr. Heavin is a 55 year old male with known coronary artery  disease with last catheterization in July of 2004 at which time he had a  stent placed to his marginal and circumflex artery.  He had a subsequent  Cardiolite at the end of July 2004 revealing no ischemia and an ejection  fraction of 55%.  He now presents with recurrent atypical chest discomfort.  Cardiac enzymes are negative x3 for acute myocardial infarction.   BASELINE DATA:  EKG reveals sinus rhythm at 61 beats per minute with  nonspecific ST abnormalities.  Blood pressure 108/62.   Adenosine 50 mg was infused over a four minute protocol with Cardiolite  injected at three minutes.  The patient reported chest pain, shortness of  breath, and nausea which resolved in recovery.  EKG revealed a few PAC's and  no ischemic changes.   FINAL IMAGES AND RESULTS:  Pending M.D. review.     ________________________________________  ___________________________________________  Jae Dire, P.A. [facility]                      [name], M.D.   AB/MEDQ  D:  02/13/2004  T:  02/13/2004  Job:  [id number]

## 2011-04-18 NOTE — Cardiovascular Report (Signed)
NAME:  Daniel Fields, Daniel Fields NO.:  [id number]   MEDICAL RECORD NO.:  [id number]                   PATIENT TYPE:  INP   LOCATION:  2012                                 FACILITY:  [facility]   PHYSICIAN:  Arturo Morton. Riley Kill, M.D. [facility]         DATE OF BIRTH:  Jul 24, 1956   DATE OF PROCEDURE:  DATE OF DISCHARGE:                              CARDIAC CATHETERIZATION   INDICATIONS:  The patient is a very nice 55 year old gentleman who has had  prior metastatic seminoma.  He has been in remission.  There has been a  question of pulmonary nodules, but these have been resolved.  On Friday of  last week, he presented with an acute inferior wall infarction and had a  subtotal occlusion of the right coronary artery.  This was stented using a  Taxus drug-eluding stent.  At that time he was noted to have an additional  90% plus stenosis in the circumflex coronary artery.  It was felt that he  should be brought back to the laboratory for elective dilatation of this  lesion.  Informed consent was obtained, and the patient was brought to the  catheterization laboratory for further evaluation.   PROCEDURE:  Percutaneous stenting of the right coronary artery.   DESCRIPTION OF PROCEDURE:  The patient was brought to the catheterization  lab, prepped and draped in the usual fashion.  Through an anterior puncture,  the left femoral artery was easily entered.  The patient had been on  clopidogrel, and therefore bivalirudin was used as the anticoagulant.  Two  views of the right coronary artery were obtained, which demonstrated a wide  patency at the site of recent stenting during the setting of an acute  infarction.  We initially used a 3.57-French Voda guide.  This was exchanged  for a JL3.5 guiding catheter, and we used a BMW wire.  However, we had some  difficulty passing the wire down the vessel due to the extensive tortuosity  and the multiple angles.  As a result, we went back to  the P[place] guide and we  were able to get good seating, and we used a Radio producer.  With adequate anticoagulation, a 2.5-mm CrossSail balloon was passed to the  site of the lesion.  A 6-atmosphere inflation was performed with the  undersized balloon.  There was relief of the residual stenosis, but as the  balloon was let down there was evidence of an antegrade dissection with  staining and abrupt vessel closure.  With this, an attempt was made to pass  a 2.5-mm x 18 Guidant pixel stent as it was thought that this stent had the  best chance of tracking down the vessel.  We were able to get it over the  lesion site, and subsequently a 15-atmosphere inflation was performed.  With  this, there was reestablishment of TIMI-3 flow, relief of pain.  There  continued to be  extra vessel staining distal to the stent site, but no  impairment of runoff suggesting that the dissection site itself was covered.  Over time, the vessel continued to improve.  Dr. Samule Ohm and I reviewed the  films.  It was felt that a second stent would be appropriate to place just  distal to the first one.  A second 2.5 x 13 pixel stent was then placed  distal to the first stent.  This was carefully positioned.  This was then  taken up to about 10 atmospheres.  The balloon slightly pulled back, and  inflated again to 14 atmospheres.  There appeared to be excellent runoff and  perhaps some foreshortening in the artery distal to the stent site, but  there was excellent TIMI-3 runoff.  Several 15 atmosphere inflations were  performed in the overlap zone of the 2 stents.  There was also a  pseudostenosis noted proximally due to the wire, and this was relieved once  the wire was removed.  Multiple final followup views over 10 to 15 minutes  revealed continued patency of the artery with good runoff.  At this point,  it was felt that the procedure should be discontinued.  All catheters were  removed and the femoral  sheath was sewn into place.   ANGIOGRAPHIC DATA:  1. The right coronary artery had a previous high-grade stenosis and was     stented on Friday.  At the current time, the vessel was widely patent at     the stented site without vessel compromise and with TIMI-3 flow.  2. The circumflex coronary artery demonstrates marked tortuosity.  In the     mid vessel, there is a 90% to 95% stenosis.  Following initial balloon     dilatation, the stenosis was relieved, but there was an antegrade     dissection with vessel staining and abrupt closure.  Following stenting     of this entire area, the vessel staining was no longer present.  There     was excellent TIMI-3 flow with no residual stenosis.  There was some mild     narrowing just distal to the distal stent site, but it did not appear to     be flow limiting, and given the location it was felt that further     attempts to stent this area should not be performed.   The final films were reviewed with both the patient and his family in  detail.   CONCLUSIONS:  1. Continued patency of the previously stented right coronary artery.  2. Successful percutaneous stenting of the circumflex coronary artery with     initial balloon dilatation complicated by antegrade vessel dissection     with an abrupt closure, with bail-out stenting.   DISPOSITION:  The patient will need to remain on aspirin and Plavix.  He has  very mild thrombocytopenia but appears to be stable.  Followup will be with  Dr. [place] Bing.  Oncologic followup will be with Dr. Ladona Horns. Neijstrom.                                               Arturo Morton. Riley Kill, M.D. [facility]    TDS/MEDQ  D:  03/20/2003  T:  03/22/2003  Job:  [id number]   cc:   [place] Bing, M.D. [facility] S. Mariel Sleet, MD  [address]  [place]  [place] [address]  Fax: [phone]   [facility]

## 2011-04-18 NOTE — Group Therapy Note (Signed)
NAME:  Daniel Fields, Daniel Fields NO.:  [id number]   MEDICAL RECORD NO.:  [id number]                    PATIENT TYPE:  PREC   LOCATION:                                       FACILITY:   PHYSICIAN:  [name], M.D.                DATE OF BIRTH:  11-14-1956   DATE OF PROCEDURE:  DATE OF DISCHARGE:                                   PROGRESS NOTE   PROCEDURE:  Exercise Cardiolite   INDICATIONS:  This patient is a 55 year old male with known coronary artery  disease, status post inferior myocardial infarction with 99% occlusion of  the RCA, 95% occlusion of the obtuse marginal with treatment with stent of  the RCA and circumflex in April 2004.  In May of 2004 repeat catheterization  revealed patent stents and compromised RV marginal branch from the  midsection of the RCA.  Medical treatment was recommended.   On Apr 25, 2003 a Cardiolite revealed no ischemia.  The patient continued to  have angina.  Repeat catheterization was done in July 2004.  Stent to the  left circumflex was done.  He returned to our office yesterday with  complaints of continued chest discomfort, 1 episode severe enough to take 1  sublingual nitroglycerin with relief of pain.   BASELINE DATA:  EKG shows sinus rhythm at 74 beats/minute with blood  pressure of 130/70.  This EKG has not changed from previous EKG done in  office yesterday.   The patient exercised for a total of 10 minutes to Bruce protocol stage 3;  stage was held secondary to fatigue.  He exercised up to 10.1 METS.  He  complained of chest pressure with burning in his substernal region starting  at about 1 minute of exercise.  This increased to 5/10 chest discomfort with  shortness of breath.  He was watched in recovery for approximately 20  minutes and was continuing to have 2/10 chest discomfort.  At that time he  was given 1 sublingual nitroglycerin with relief of pain down to 1/10 pain.  Five minutes later he was  given a second sublingual nitroglycerin with  complete relief of pain.  His maximum heart rate during exercise was 133  beats/minute which is 77% of predicted maximum.  His maximum blood pressure  was 158/80.  His EKG showed no ischemic changes and no arrhythmias.   The patient was admitted to the hospital for further evaluation of chest  discomfort.  We will obtain serial cardiac enzymes and EKGs. He has been  continued on his home medical regimen. He will be given nitroglycerin p.r.n.  chest discomfort.   Final images and results of this exercise Cardiolite are pending MD review.     Amy Mercy Riding, P.A. [facility]                     [name], M.D.  AB/MEDQ  D:  06/23/2003  T:  06/23/2003  Job:  [id number]

## 2011-04-18 NOTE — H&P (Signed)
NAME:  Daniel Fields, ZEMAN NO.:  [id number]   MEDICAL RECORD NO.:  [id number]                   PATIENT TYPE:  EMS   LOCATION:  MAJO                                 FACILITY:  [facility]   PHYSICIAN:  Olga Millers, M.D.                DATE OF BIRTH:  1956/07/08   DATE OF ADMISSION:  09/15/2003  DATE OF DISCHARGE:                                HISTORY & PHYSICAL   HISTORY OF PRESENT ILLNESS:  Daniel Fields is a 55 year old male with a past  medical history of coronary artery disease, hypertension, hyperlipidemia,  history of testicular cancer, thrombocytopenia, who presents with  palpitations and chest pain.  The patient presented with an acute inferior  myocardial infarction in April of 2004, and had PCI of his right coronary  artery at that time.  He subsequently had elective PCI of his obtuse  marginal.  A recent catheterization revealed a residual 70% obtuse marginal  following the stent, and he a PCI at that time.  His most recent nuclear  study was performed on June 23, 2003, and showed an ejection fraction of  55%, and no ischemia.  The patient now presents with palpitations and chest  pain.  He states that he has had intermittent skipping since his  myocardial infarction.  Associated with the skipping, he describes a pain  that lasts for the duration of the skip, and then resolves.  There can be  nausea and shortness of breath, but there is no diaphoresis.  He also has  dyspnea on exertion which is a Crohn's disease problem, but there is no  orthopnea, PND, or pedal edema.  Because of these symptoms, he presented to  the emergency room, and were asked to further evaluate.   MEDICATIONS:  1. Toprol 75 mg p.o. daily.  2. Aspirin 81 mg p.o. daily.  3. Plavix 75 mg p.o. daily.  4. Zocor 40 mg p.o. q.h.s.  5. Protonix 40 mg p.o. daily.  6. Altace 5 mg p.o. daily.  7. Allopurinol 300 mg p.o. daily.  8. He also takes Cardizem 180 mg p.o. daily.   ALLERGIES:  No known drug allergies.   SOCIAL HISTORY:  He does smoke.  He does not consume alcohol.   FAMILY HISTORY:  Positive for coronary artery disease.   PAST MEDICAL HISTORY:  Significant for hypertension and hyperlipidemia, but  there is no diabetes mellitus.  He does have a history of coronary disease  as outlined in the HPI.  There is also a history of testicular cancer, and  he has been treated for that.  He has a history of thrombocytopenia.  He  also has a history of peptic ulcer disease.  He has had prior  cholecystectomy, as well as a tonsillectomy.   REVIEW OF SYSTEMS:  He denies any headaches, fevers, or chills.  There is no  productive cough or hemoptysis.  There is  no dysphagia, odynophagia, melena,  or hematochezia.  There is no dysuria or hematuria.  There is no rash or  seizure activity.  He denies any orthopnea, PND, or pedal edema.  There is  dyspnea on exertion.  The remaining systems are negative.   PHYSICAL EXAMINATION:  VITAL SIGNS:  Blood pressure of 135/92, pulse 101.  He is afebrile.  GENERAL:  He is well-developed and well-nourished, in no acute distress.  He  did not appear to be depressed.  SKIN:  Warm and dry.  HEENT:  Unremarkable with no mileage.  SKIN:  There was no peripheral clubbing.  NECK:  Supple with normal upstroke bilaterally, and there were no bruits  noted.  There was no jugular venous distention, no thyromegaly noted.  CHEST:  Clear to auscultation.  Normal air exchange.  CARDIOVASCULAR:  Regular rate and rhythm with normal S1 and S2.  There are  no murmurs, rubs, or gallops noted.  ABDOMEN:  Nontender, nondistended.  Positive bowel sounds.  No  hepatosplenomegaly.  No masses appreciated.  There is no abdominal bruit.  He has 2+ femoral pulses bilaterally, and there are no bruits.  EXTREMITIES:  No edema, and I can palpate no cords.  He has 2+ dorsalis  pedis pulses bilaterally.  NEUROLOGIC:  Grossly intact.   His  electrocardiogram shows normal sinus rhythm with no acute ST changes.  His hemoglobin and hematocrit are 15 and 45.  His potassium is low at 3.2.  His initial enzymes are negative.   DIAGNOSES:  1. Atypical chest pain.  2. Palpitations.  3. History of coronary disease.  4. Hypertension.  5. Hyperlipidemia.  6. History of testicular cancer.   PLAN:  [name] presents with atypical chest pain and palpitations that  sound to be premature ventricular contractions.  He describes these as a  skip.  We will admit to rule out myocardial infarction, although I think  this is unlikely.  We will follow telemetry.  If the above is negative, and  there is no significant arrhythmia documented by telemetry, then we will  plan to discharge in the morning, and he will follow up with Dr. Dorethea Clan for  consideration of an event monitor and need for repeat Cardiolite.  Given his  increasing palpitations, I will discontinue his Cardizem, and we will  increase his Toprol to 75 mg p.o. daily (he was on 50 mg p.o. daily  previously).  We will make further recommendations once we have the above  information.                                                Olga Millers, M.D.    BC/MEDQ  D:  09/16/2003  T:  09/16/2003  Job:  [id number]
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
## 2011-04-18 NOTE — Discharge Summary (Signed)
NAME:  Daniel Fields, Daniel Fields NO.:  [id number]   MEDICAL RECORD NO.:  [id number]                   PATIENT TYPE:  INP   LOCATION:  2012                                 FACILITY:  [facility]   PHYSICIAN:  Jesse Sans. Wall, M.D. [facility]            DATE OF BIRTH:  05-Feb-1956   DATE OF ADMISSION:  03/17/2003  DATE OF DISCHARGE:  03/22/2003                                 DISCHARGE SUMMARY   DISCHARGE DIAGNOSES:  1. Status post inferior ST elevation myocardial infarction.     a. Treated with emergent PTCA and stenting to the right coronary artery        with a Taxus stent.  2. Coronary artery disease.     a. Cardiac catheterization on 03/17/2003, revealing left main normal.        Left anterior descending with proximal 25% stenosis and luminal        irregularities. Circumflex proximal AV groove with luminal        irregularities. MOM large with proximal 95% stenosis. Right coronary        artery dominant with a mid, long, 99% stenosis with TIMI-1 flow. Left        ventricular ejection fraction with inferior basilar akinesis and        ejection fraction of 60%.     b. Right coronary artery stenting, as noted above.     c. Stenting to the circumflex on 03/20/2003, complicated by dissection        with abrupt vessel closure. Vessel rescued with overlapping 2.5 Pixel        stent through significant vessel tortuosity with reduction of stenosis        of 90 to 0%.  3. Thrombocytopenia.  4. Hyperlipidemia.  5. Tobacco abuse.  6. Hypertension.   PROCEDURES PERFORMED THIS ADMISSION:  1. Cardiac catheterization by Dr. Rollene Rotunda, 03/17/2003. The results are     noted above.  2. Emergent PTCA and stenting of the RCA.  3. Elective percutaneous coronary intervention of the left circumflex by Dr.     Shawnie Pons on 03/20/2003.   HOSPITAL COURSE:  Please see the dictated admission history and physical by  Dr. Rollene Rotunda on 03/17/2003, for complete details. Briefly, this  55-year-  old male presented to the emergency room with acute chest pain and nausea  beginning at 3 a.m. on 4/16. EKG in the emergency room revealed inferior ST  elevation. He was taken emergently to the cardiac catheterization lab. The  results are noted above. He underwent successful stenting of the RCA lesion.  He tolerated the procedure well and had no immediate complications. He  remained stable throughout the weekend.  He had no further chest pain. His  labs revealed dyslipidemia with an HDL of 25 and he was placed on Zocor. He  was counseled about tobacco cessation. He went for elective percutaneous  coronary intervention of the  circumflex artery on 03/20/2003. This was  accompanied with complications of dissection, but it was rescued with an  overlapping stent. The patient did well post-procedure and had no further  complications. The patient was noted to have a chronically low platelet  count. On admission, it was 102,000. On 4/19, it was 115,000. At discharge,  there was no real change. The patient's platelet count was 121,000 on 4/21.  The patient was seen by Dr. Riley Kill again on 03/21/2003, and he went over the  importance of tobacco cessation with the patient. He spent greater than 45  minutes discussing his heart disease and the need for tobacco cessation. On  4/21, the patient was seen by Dr. Eden Emms, who felt the patient was ready for  discharge to home. He noted that the patient's groins were stable. He would  need followup in [place] in two weeks with Dr. Dorethea Clan. [facility] Dr. Dorethea Clan saw the patient a few times this weekend  while the patient was admitted.   LABS:  On 4/21, white count 6000, hemoglobin 15.2, hematocrit 43.6, platelet  count 121,000. On 4/19, INR was 0.9. On 4/19, sodium 138, potassium 3.7,  chloride 105, CO2 28, glucose 93, BUN 11, creatinine 1.0. On 4/16, total  bilirubin 1.0, alkaline phophatase 76, SGOT 23, SGPT 39. Total  protein 6.7,  albumin 4.4, calcium 9.1. Hemoglobin A1c 4.7. Peak CK-MB was 114. Peak  troponin was 18.67. Total cholesterol 136, triglycerides 181, HDL 25, LDL  75.  TSH 1.601.  Admission chest x-ray was negative.   DISCHARGE MEDICATIONS:  1. Coated aspirin 325 mg daily.  2. Plavix 75 mg daily.  3. Toprol-XL 25 mg daily.  4. Zocor 40 mg daily at bedtime.  5. Altace 5 mg daily.  6. Allopurinol as taken before.  7. Xanax as taken before.  8. Nitroglycerin p.r.n. chest pain.   PAIN MANAGEMENT:  The patient can take Tylenol as needed for groin pain. He  is to call our office in [place] or 911 with recurrent chest pain.  Procedures for taking nitroglycerin have been reviewed.   ACTIVITY:  No work for two weeks. No driving for one week   At followup in [place] with Dr. Dorethea Clan, he can discuss when to return to  work.  The patient has been set up with cardiac rehab in [place].   DIET:  Low-fat, low-sodium.   WOUND CARE:  The patient is to follow in our office in [place] for any  groin swelling, bleeding, or bruising.   FOLLOW UP:  The patient will see Dr. Dorethea Clan in two weeks.  The office will  call and make an appointment.     [name], P.A.                        [name], M.D. [facility]    SW/MEDQ  D:  03/22/2003  T:  03/22/2003  Job:  [id number]   cc:   [name], M.D.  Fax: [phone]   [name], M.D.  [address]  [place]  [place] [address]  Fax: [phone]

## 2011-04-18 NOTE — Cardiovascular Report (Signed)
   NAME:  KINGSLY, KLOEPFER NO.:  [id number]   MEDICAL RECORD NO.:  [id number]                   PATIENT TYPE:  INP   LOCATION:  2905                                 FACILITY:  [facility]   PHYSICIAN:  Rollene Rotunda, M.D. [facility]            DATE OF BIRTH:  06/04/1956   DATE OF PROCEDURE:  DATE OF DISCHARGE:                              CARDIAC CATHETERIZATION   DICTATION ENDED AT THIS POINT.                                               Rollene Rotunda, M.D. [facility]    JH/MEDQ  D:  03/17/2003  T:  03/18/2003  Job:  [id number]

## 2011-04-18 NOTE — Cardiovascular Report (Signed)
NAME:  Daniel Fields, Daniel Fields                          ACCOUNT NO.:  [id number]   MEDICAL RECORD NO.:  [id number]                   PATIENT TYPE:  OIB   LOCATION:  6523                                 FACILITY:  [facility]   PHYSICIAN:  Veneda Melter, M.D.                   DATE OF BIRTH:  [birth year]   DATE OF PROCEDURE:  06/14/2003  DATE OF DISCHARGE:                              CARDIAC CATHETERIZATION   PROCEDURES PERFORMED:  PTCA and stent placement to the first marginal branch  of the left circumflex artery.   DIAGNOSES:  1. Coronary artery disease.  2. Stable angina pectoris.   HISTORY:  Daniel Fields is a 55 year old gentleman with coronary artery  disease who has undergone percutaneous intervention in the past.  Most  recently he has had placement of two stents to the large marginal branch of  the left circumflex artery on March 20, 2003.  He has unfortunately had  recurrent chest discomfort, and repeat catheterization on 04/21/2003 showed  patency of the stents; however, there was a kink 70% distal to the stented  region prior to trifurcation of the marginal branch in its terminal segment.  Medical therapy was pursued; however, the patient has had recurrent angina  that has been lifestyle limiting, and he is referred for percutaneous  intervention.   TECHNIQUE:  Informed consent was obtained.  The patient was brought to the  catheterization lab.  A 6 French sheath was placed in the right femoral  artery using the modified Seldinger technique.  A 6 [place] Voda left forward  guide catheter was then used to engage the left coronary artery and  selective guide shots obtained.  This showed moderate disease in the  proximal circumflex of 30%.  There was severe tortuosity.  The midsection of  the marginal branch in the stented region was patent.  Distal to this was  the 70% kink prior to further subbranches of the distal marginal branch.  The LAD had mild disease of 30%.  The patient was given  heparin to maintain  an ACT of approximately 250 seconds.  He had been pretreated with aspirin  and Plavix.  A 0.014-inch support wire was introduced and carefully advanced  into the midsection of the circumflex artery.  Due to heavy tortuosity, it  could not be advanced distally, and a 2.5 x 13-mm Cypher stent was  introduced for support.  However, again, difficulty was encountered in  advancing the wire through the proximal tortuosity and through the stented  region, possibly due to stent stress that had not yet endothelialized.  Using the support wire and a transit catheter, the midsection of the  marginal branch was carefully negotiated and the transit wire advanced into  the distal  section of the marginal branch.  A Mailman wire was then  positioned into the distal section of the marginal branch and the transit  catheter removed.  This was successful in straightening the vessel.  The 2.5  x 13-mm Cypher stent was reintroduced and carefully positioned in the distal  section of the marginal branch, with overlap of the previously placed stents  and up to the distal bifurcation of the marginal branch and deployed at 14  atmospheres for 30 seconds.  A 2.5 x 12-mm Quantum Maverick balloon was then  used to postdilate the stent.  Two inflations were performed at 12  atmospheres for 30 seconds and a single inflation across the proximal edge  and junction of the stent at 14 atmospheres for 30 seconds.  Repeat  angiography showed an excellent result with no residual stenosis, full  coverage of the lesion, and no vessel compromise.  The wire was retracted  slightly to allow the proximal segment of the circumflex artery to resume  its natural configuration, and repeat angiography was performed.  This  showed no vessel damage and TIMI 3 flow through the circumflex artery.  The  wire was then fully retracted, and intracoronary nitroglycerin was  administered.  Final angiography was performed,  confirming these findings.  The patient did have chest discomfort with balloon inflations consistent  with his pain at home.  These diminished at the termination of the case.  The guide catheter was then removed.  The sheaths were secured in position.  The patient tolerated the procedure well and was transferred to the floor in  stable condition.   FINAL RESULT:  Successful PTCA and stent placement to the large first  marginal branch of the left circumflex artery with reduction of 70%  narrowing to 0% with placement of a 2.5 x 13-mm Cypher stent.                                               Veneda Melter, M.D.    NG/MEDQ  D:  06/14/2003  T:  06/15/2003  Job:  [id number]  Ramon Dredge L. Juanetta Gosling, M.D.  [address]  [place]  [place] [address]  Fax: [phone]   [name], M.D.  Fax: [phone]   cc:   Ramon Dredge L. Juanetta Gosling, M.D.  [address]  [place]  [place] [address]  Fax: [phone]   [name], M.D.  Fax: [phone]
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
## 2011-04-18 NOTE — H&P (Signed)
NAME:  Daniel Fields, Daniel Fields                          ACCOUNT NO.:  [id number]   MEDICAL RECORD NO.:  [id number]                   PATIENT TYPE:  OBV   LOCATION:  A227                                 FACILITY:  [facility]   PHYSICIAN:  Edward L. Juanetta Gosling, M.D.             DATE OF BIRTH:  1956/07/25   DATE OF ADMISSION:  06/23/2003  DATE OF DISCHARGE:                                HISTORY & PHYSICAL   REASON FOR ADMISSION:  Mr. Knoch was undergoing a Graded Exercise Test  today when he developed chest pain that he describes as a burning sensation.  He took nitroglycerin x2 and it was relieved.  The pain got as high as a  5/10 and it lasted about 30 minutes.  He has a fairly complicated cardiac  history with having had a myocardial infarction, had a 99% right coronary  artery, a 95% ostial marginal, and he has had a stent in the right coronary  on April 16.  Repeat catheterization May 21 showed patent stents and then on  July 14 he had another stent placed.  He now has a Cypher stent which is in  the left circumflex and has the right coronary artery stent as well.  His  left ventricular function was good.  He says he is feeling fine now.  He  does not have any chest pain.  He may have had some chest discomfort which  was fairly mild in the day or two before his stress test.   PAST MEDICAL HISTORY:  1. Coronary artery occlusive disease.  2. Hyperlipidemia.  3. Hypertension.  4. He has a treated testicular cancer.  5. He has a history of nodular lung opacities.  6. When he was in the hospital last time, his glucose was somewhat elevated.   SOCIAL HISTORY:  He lives in [place] with his wife.  Smokes about a  package of cigarettes daily.  He is still smoking although he fudges on  that.  He has about 25-30 pack year smoking history.  He does not drink any  alcohol.   FAMILY HISTORY:  His mother has coronary artery occlusive disease.  Father  died of a cerebrovascular accident and coronary  artery disease.   CURRENT MEDICATIONS:  1. Plavix 75 mg daily.  2. Toprol XL 50 mg daily.  3. Altace 5 mg daily.  4. Zocor 40 mg daily.  5. Protonix 40 mg daily.  6. Allopurinol 300 mg daily.  7. Cardizem CD 180 daily.  8. Enteric-coated aspirin 81 mg daily.  9. Nitroglycerin as needed.   ALLERGIES:  He has no known drug allergies.   PHYSICAL EXAMINATION:  GENERAL:  Exam shows that he appears to be  comfortable at this point.  VITAL SIGNS:  Blood pressure 128/70, pulse is 70, respirations are 18.  HEENT:  His pupils are reactive to light and accommodation.  Nose and throat  are clear.  NECK:  Supple without masses.  He does not have any lymphadenopathy.  CHEST:  Clear without gallop.  HEART:  Regular.  LUNGS:  Clear.  ABDOMEN:  Soft.  No tenderness, no masses felt.  EXTREMITIES:  No clubbing, cyanosis, or edema.  CENTRAL NERVOUS SYSTEM:  Grossly intact.   LABORATORY DATA:  His EKG showed nonspecific ST-T wave abnormalities.   ASSESSMENT:  He has chest discomfort again.  He is going to have serial  cardiac enzymes, finish up his Cardiolite scan so that we can get a little  better idea what is going on.  He will be in hospital observation and we  will await enzymes and the Cardiolite scan before we have a final decision  about exactly what we need to do.                                               Edward L. Juanetta Gosling, M.D.    ELH/MEDQ  D:  06/23/2003  T:  06/23/2003  Job:  [id number]

## 2011-04-18 NOTE — Cardiovascular Report (Signed)
   NAME:  CASHEL, BELLINA NO.:  [id number]   MEDICAL RECORD NO.:  [id number]                   PATIENT TYPE:  INP   LOCATION:  2905                                 FACILITY:  [facility]   PHYSICIAN:  Rollene Rotunda, M.D. [facility]            DATE OF BIRTH:  [birth year]   DATE OF PROCEDURE:  03/17/2003  DATE OF DISCHARGE:                              CARDIAC CATHETERIZATION   PRIMARY CARE PHYSICIAN:  Edward L. Juanetta Gosling, M.D.   PROCEDURE:  Left heart catheterization/coronary arteriography.   INDICATIONS FOR PROCEDURE:  Evaluate patient with acute inferior myocardial  infarction.   PROCEDURAL NOTE:  Left heart catheterization was performed via the right  femoral artery.  The right femoral artery was cannulated using an anterior  wall puncture.  A 6-French arterial sheath was inserted via the modified  Seldinger technique.  Preformed Judkins and a pigtail catheter were  utilized.  The patient tolerated the procedure well.   RESULTS:  1. Hemodynamics     A. LV:  114/15.     B. AO:  110/73.  2. Coronaries     A. The left main was normal.     B. The LAD had proximal 25% stenosis followed by luminal irregularities.     C. The circumflex in the AV groove had luminal irregularities.  The        vessel essentially ended as a very large mid obtuse marginal which had        a mid 95% stenosis.     D. The right coronary artery was a dominant vessel.  There was a long mid        99% stenosis.  There was TIMI-1 flow in the vessel.     E. Left ventriculogram:  The left ventriculogram was obtained in the RAO        projection.  The EF was approximately 60% with inferobasilar akinesis.    CONCLUSION:  Severe 2-vessel coronary artery disease.  The right coronary  artery is the obvious infarct vessel.  The patient will have percutaneous  revascularization of the right coronary artery urgently per Dr. Riley Kill.  He  will have elective revascularization of the   circumflex.                                                Rollene Rotunda, M.D. [facility]    JH/MEDQ  D:  03/17/2003  T:  03/18/2003  Job:  [id number]   cc:   Ramon Dredge L. Juanetta Gosling, M.D.  [address]  [place]  [place] [address]  Fax: [phone]   Devra Dopp, M.D.

## 2011-04-18 NOTE — Discharge Summary (Signed)
NAME:  Daniel Fields, Daniel Fields                          ACCOUNT NO.:  [id number]   MEDICAL RECORD NO.:  [id number]                   PATIENT TYPE:  OIB   LOCATION:  6523                                 FACILITY:  [facility]   PHYSICIAN:  Veneda Melter, M.D.                   DATE OF BIRTH:  September 25, 1956   DATE OF ADMISSION:  06/14/2003  DATE OF DISCHARGE:  06/15/2003                                 DISCHARGE SUMMARY   DISCHARGE DIAGNOSES:  1. Progressive stable angina.     a. Coronary artery disease status post Cypher stent to the left        circumflex.     b. History of percutaneous coronary intervention to left circumflex and        right coronary artery with stenting.     c. Preserved LV function.  2. Treated dyslipidemia.  3. History of testicular carcinoma.  4. Thrombocytopenia.  5. Treated hypertension.  6. Mildly elevated glucose.  7. History of nodular opacities in the left lung.  CT scan, followed by Dr.     Mariel Sleet.   HOSPITAL COURSE:  Daniel Fields is a 55 year old male with a history of  coronary artery disease who is referred for elective cardiac catheterization  secondary to ongoing progressive stable angina.  A Cardiolite scan in May  2004, showed no ischemia.  However, the patient continued to have chest pain  with exertion.  He was referred for elective cardiac catheterization.  This  was performed by Veneda Melter, M.D. on June 14, 2003.  Please see his  dictated note for complete details.  He underwent percutaneous coronary  intervention with a Cypher stent to the left circumflex reducing 7% stenosis  to 0% distal vessel.  On the morning of June 15, 2003, he was found in  stable condition.  His platelet count was 106,000, and this was similar to  previous readings.  His fasting glucose was mildly elevated at 113.  His  post procedure CK MB was 0.9, and urinalysis was negative.  At the time of  this dictation, the patient is to be ambulated with assistance.  As long as  he is  stable, he will be discharged to home later this morning.   LABORATORY DATA:  White count 5700, hemoglobin 16.3, hematocrit 44.7,  platelet 106,000.  Sodium 140, potassium 3.6, chloride 107, CO2 28, glucose  113, BUN 11, creatinine 1.0, calcium 8.8.  CK MB 0.9.  Urinalysis negative.  Urine culture no growth x1 day.   DISCHARGE MEDICATIONS:  1. Plavix 75 mg daily.  This is to be continued for greater than or equal to     six months status post Cypher stenting.  2. Enteric coated aspirin 325 mg daily.  3. Toprol XL 50 mg one and one half tablets daily (This is increased from 50     mg daily at the time of discharge  secondary to elevated blood pressure.).  4. Altace 5 mg daily.  5. Cardizem CD 180 mg daily.  6. Lipitor 10 mg q.h.s.  7. Allopurinol 300 mg daily.  8. Protonix 40 mg daily.  9. Isosorbide mononitrate 30 mg daily.  10.      Nitroglycerin p.r.n. chest pain.   PAIN MANAGEMENT:  Tylenol as needed, nitroglycerin as needed for chest pain.  Should call office in [place] or 911 for chronic chest pain.   ACTIVITY:  No driving or other exertion.  Excuse from work for three days.   DIET:  Low-salt, low-fat diet.   WOUND CARE:  The patient is to call our office in [place] for any  swelling, bleeding or bruising.   SPECIAL INSTRUCTIONS:  He has been instructed to stop smoking.  Smoking  cessation consult has been ordered prior to discharge.   FOLLOWUP:  The patient is to see physician assistant for Dr. Dorethea Clan on June 22, 2003, at 2 p.m.  His followup appointment to be set up with Dr. Dorethea Clan  in the next 3-4 months.  The patient should see Dr. Juanetta Gosling in the next 2-3  weeks.  He should call for an appointment.   DISPOSITION:  At followup with his primary care physician, the patient  should have repeat fasting glucose performed as well as possibly hemoglobin  A1C to follow up on his mildly elevated glucose.  Had follow up with the PA  for Dr. Dorethea Clan in [place] on June 22, 2003.  He will have a CBC performed  to follow up on his platelet count.  He will have continued follow up with  Dr. Mariel Sleet secondary to his history of testicular cancer and left lung  nodular opacities.      Daniel Fields, P.A.                        Veneda Melter, M.D.    SW/MEDQ  D:  06/15/2003  T:  06/15/2003  Job:  [id number]   cc:   Daniel Fields L. Juanetta Gosling, M.D.  [address]  [place]  [place] [address]  Fax: [phone]   Daniel Fields, M.D.  Fax: [phone]    cc:   Daniel Fields L. Juanetta Gosling, M.D.  [address]  [place]  [place] [address]  Fax: [phone]   Daniel Fields, M.D.  Fax: [phone]

## 2011-04-18 NOTE — Discharge Summary (Signed)
Daniel Fields, Daniel Fields NO.:  [id number]   MEDICAL RECORD NO.:  [id number]          PATIENT TYPE:  INP   LOCATION:  3705                         FACILITY:  [facility]   PHYSICIAN:  [facility], M.D.   DATE OF BIRTH:  [birth year]   DATE OF ADMISSION:  06/21/2005  DATE OF DISCHARGE:  06/23/2005                                 DISCHARGE SUMMARY   DISCHARGE DIAGNOSIS:  1.  Progressive chest pain, negative cardiac enzymes, status post cardiac      catheterization on June 23, 2005, by Dr. Rollene Fields, showing mild      residual non-obstructive coronary artery disease, other than the acute      marginal stenosis.  For further cardiac catheterization details, please      see cath report.  The patient's stents were widely patent, EF 65% with      normal wall motion.  2.  Rash of unknown etiology which appears to be resolving.   PAST MEDICAL HISTORY:  1.  Coronary artery disease status post multiple previous stents.  2.  History of non-cardiac chest pain for which he has had multiple      admissions status post Adenosine Cardiolite in March 2005 with an EF of      57% with no ischemia or scar.  3.  History of testicular cancer status post PET scan in February 2006 which      did not show any recurrent disease.  4.  Hypertension.  5.  Obstructive sleep apnea status post UPPT.  6.  Hyperlipidemia.  7.  Thrombocytopenia.  8.  Ongoing tobacco abuse, 1-2 packs per day x 30 years.  9.  History of left facial numbness x 1 year.   DISPOSITION:  The patient is being discharged home with instructions to  continue a low fat, low salt, low cholesterol diet.  Activities are no  driving for two days, no lifting over 10 pounds x one week, the patient may  shower.  The patient may walk up steps.  No tub bathing x 2 days.  Gently  clean cath site with soap and water.  I have arranged a follow up  appointment with [facility] at the [facility] on August 10 at 1 p.m.  The patient  has also been given a copy of the [facility] discharge  instructions after cardiac catheterization.   MEDICATIONS:  Aspirin has increased from 81 to 325 mg daily.  The patient is  instructed to continue his previous medications including Plavix 75 mg  daily, Zocor 40 mg daily, Protonix 40 mg daily, Altace 5 mg daily, Cardizem  180 mg daily, folic acid 1 mg daily, Topamax 25 mg b.i.d.  The patient is  instructed to continue his Zyrtec or other allergy medicines as prescribed  by Dr. Juanetta Fields.   Fields COURSE:  Daniel Fields is a 55 year old Caucasian gentleman with  known history of coronary artery disease and multiple admissions for non-  cardiac related chest pain.  He initially presented to [facility]  on June 21, 2005, with complaints of exertional chest pain  perhaps slightly  progressive over the last 1 1/2 years.  He was evaluated by Dr. Lorenso [facility] at  [facility].  His EKG was not acute and his first set of cardiac markers were  negative.  However, given the progressive nature of his chest pain and his  response to nitroglycerin with decrease in chest discomfort, it was felt the  patient needed further cardiac evaluation.  He was transferred to [facility] for cardiac catheterization to re-evaluate his stent.  On  presentation to [facility], the patient was not in any acute  distress, however, he had received IV morphine en route from [facility] for  return of his chest discomfort.  The initial lab report showed a white blood  cell count of 9.9, hemoglobin 15.8, hematocrit 43.7.  BNP less than 100.  EKG showed normal sinus rhythm at a rate of 86 with no ST or T wave changes.  The patient was admitted, monitored on telemetry, cardiac enzymes were  cycled, he was placed on heparin prophylactically.  His rash was treated  with Benadryl and hydrocortisone 1% cream with improvement.  The patient was  taken to the cath lab on June 23, 2005, results are as  stated above.  The  patient is being discharged home with instructions to continue his previous  medications with increase in aspirin dose.  Also, to make lifestyle  modifications, i.e., tobacco cessation, continue a low fat, low salt, low  cholesterol diet.      Mic   MB/MEDQ  D:  06/23/2005  T:  06/23/2005  Job:  [id number]   cc:   Daniel Fields, M.D.  [address]  [place]  [place] [address]  Fax: [phone]   [facility], M.D.  Fax: [phone]

## 2011-04-18 NOTE — Cardiovascular Report (Signed)
NAME:  Daniel Fields, Daniel Fields NO.:  [id number]   MEDICAL RECORD NO.:  [id number]                   PATIENT TYPE:  INP   LOCATION:  2905                                 FACILITY:  [facility]   PHYSICIAN:  Arturo Morton. Riley Kill, M.D. [facility]         DATE OF BIRTH:  1956/09/12   DATE OF PROCEDURE:  03/17/2003  DATE OF DISCHARGE:                              CARDIAC CATHETERIZATION   INDICATIONS FOR PROCEDURE:  The patient is a 55 year old with a prior  history of testicular cancer who presented today with chest pain and  electrocardiographic evidence of inferior infarction.  He was initially seen  and transferred down to the catheterization lab where diagnostic  catheterization was done by Dr. Antoine Poche.  There was a subtotal occlusion of  the right coronary artery with TIMI-1 flow.  There was a high-grade lesion  in the distal circumflex but with TIMI-3 flow.  Urgent percutaneous  intervention of the right coronary artery was recommended.   PROCEDURE:  Percutaneous stenting of the right coronary artery.   DESCRIPTION OF PROCEDURE:  The patient underwent diagnostic catheterization  by Dr. Antoine Poche.  Heparin and Integrilin were given according to protocol,  and ACTs were appropriate.  He had an indwelling 7-French sheath.  Through  this, a JR4 with sideholes was utilized to intubate the right coronary  artery.  The lesion was crossed with a Hi-Torque Floppy wire.  We initially  dilated with a 2.5 x 15-mm CrossSail balloon.  The vessel was then stented  using a 3.0 x 28-mm Taxus drug-eluting stent.  A 3.5 Quantum Maverick then  was used to post dilate the lesion throughout the course of the stent up to  12 atmospheres.  There was marked improvement in the appearance of the  artery.  There were 2 right ventricular branches which had their exits  overlapped by the stent, and there was some pinching of both of these right  ventricular branches of 80% to 90%, but both of these  appeared to be  somewhat improved after intracoronary nitroglycerin.  They did remain open.  There were no complications noted.  There was no evidence of no re-flow.  The 99% stenosis was reduced to 0% with TIMI-1 flow at the beginning and  TIMI-3 flow at the completion.  His chest pain was relieved with this.   ANGIOGRAPHIC DATA:  The right coronary artery is a dominant vessel.  There  is a subtotal occlusion in the mid vessel just between 2 right ventricular  branches.  The disease extends proximal to this and distal to the right  ventricular branches in both ends.  There are twin posterior descending  branches, the first of which is single, and the second bifurcates  proximally.  There are 2 posterolateral branches.  There is some mild  luminal irregularity in the distal vessel noted.  There is perhaps 30%  narrowing in a continuation branch.  Also, there is  a distal area near the  acute margin of about 20% to 30% narrowing.  Following stenting, the mid  area of subtotal occlusion demonstrates 0% residual stenosis with excellent  TIMI-3 flow and excellent runoff with no evidence of edge dissection.   CONCLUSION:  Successful percutaneous stenting of the right coronary artery  in the setting of acute myocardial infarction.    DISPOSITION:  The patient will need to come back for elective PCI of the  circumflex early in the week.  Importantly, he will also need aspirin and  Plavix for 6 months.  He should not have an MRI scan for 2 months.  Antibiotic prophylaxis should be continued for 6 months.  I have discussed  the case with his wife and subsequently with the patient.  He will be taken  to the CCU in satisfactory clinical condition.                                               Arturo Morton. Riley Kill, M.D. [facility]    TDS/MEDQ  D:  03/17/2003  T:  03/18/2003  Job:  [id number]   cc:   Ramon Dredge L. Juanetta Gosling, M.D.  [address]  [place]  [place] [address]  Fax: [phone]   Devra Dopp, M.D.    Rollene Rotunda, M.D. [facility]   Cardiac Catheterization Lab   Jesse Sans. Wall, M.D. [facility]

## 2011-05-29 ENCOUNTER — Ambulatory Visit (INDEPENDENT_AMBULATORY_CARE_PROVIDER_SITE_OTHER): Payer: BC Managed Care – PPO | Admitting: Orthopedic Surgery

## 2011-05-29 ENCOUNTER — Encounter: Payer: Self-pay | Admitting: Orthopedic Surgery

## 2011-05-29 VITALS — Ht 70.0 in | Wt 217.0 lb

## 2011-05-29 DIAGNOSIS — M67919 Unspecified disorder of synovium and tendon, unspecified shoulder: Secondary | ICD-10-CM

## 2011-05-29 DIAGNOSIS — M7550 Bursitis of unspecified shoulder: Secondary | ICD-10-CM

## 2011-05-29 DIAGNOSIS — M719 Bursopathy, unspecified: Secondary | ICD-10-CM

## 2011-05-29 NOTE — Progress Notes (Signed)
Consult  Dr. Juanetta Gosling  LEFT shoulder pain no trauma  55 year old male progressively increasing pain over several years now complains of sharp stabbing burning pain LEFT shoulder.  Pain is 10 out of 10, constant.  Worse with forward elevation radiates into the upper arm.  Pain came on gradually.  Review of systems he reports fever chills, blurred vision eye pain redness watering, chest pain murmurs, shortness of breath tightness, nausea.  He reports dry skin numbness dizziness nervousness anxiety easy bleeding temperature intolerance and seasonal ALLERGIES  Vital signs are stable as recorded  General appearance is normal  The patient is alert and oriented x3  The patient's mood and affect are normal  The patient is ambulating with a normal gait pattern  The cardiovascular exam reveals normal pulses and temperature without edema swelling.  The lymphatic system is negative for palpable lymph nodes  The sensory exam is normal.  There are no pathologic reflexes.  Balance is normal.  Body area: Neck.  He does exhibit some tenderness at the base of the cervical spine and he's lost some of the range of motion turning side to side.  His RIGHT shoulder seems normal  LEFT shoulder pain for 4 to elevation positive impingement sign.  Neer test positive.  Cross chest test is normal a.c. Joint nontender.  He is tender at the anterolateral border of the acromion.  He has decreased internal rotation normal external rotation painful forward elevation weakness with supraspinatus testing in the empty can sign.  Shoulders stable  X-rays were done at the hospital they're negative  Impression partial rotator cuff tear vs. Bursitis  Recommend injection

## 2011-05-29 NOTE — Patient Instructions (Signed)
You have received a steroid shot. 15% of patients experience increased pain at the injection site with in the next 24 hours. This is best treated with ice and tylenol extra strength 2 tabs every 8 hours. If you are still having pain please call the office.    

## 2011-06-02 ENCOUNTER — Encounter: Payer: Self-pay | Admitting: Orthopedic Surgery

## 2011-06-02 MED ORDER — NABUMETONE 500 MG PO TABS: 500.0000 mg | ORAL_TABLET | Freq: Two times a day (BID) | ORAL | Status: AC

## 2011-06-02 MED ORDER — METHYLPREDNISOLONE ACETATE 40 MG/ML IJ SUSP: 40.0000 mg | Freq: Once | INTRAMUSCULAR | Status: DC

## 2011-06-02 NOTE — Procedures (Signed)
LEFT shoulder subacromial space Injection LEFT knee.  Consent was obtained.  Time out was taken   LEFT Shoulderwas injected with Depo-Medrol 40 mg plus lidocaine 1% 4 cc.  Shoulder was prepped with alcohol and anesthetized with ethyl chloride.  The injection was tolerated without complication.   

## 2011-06-02 NOTE — Progress Notes (Signed)
X-ray report  LEFT shoulder x-ray  Differential diagnosis bursitis vs. Torn rotator cuff  Radiographs are normal

## 2011-07-01 ENCOUNTER — Ambulatory Visit (INDEPENDENT_AMBULATORY_CARE_PROVIDER_SITE_OTHER): Payer: BC Managed Care – PPO | Admitting: Orthopedic Surgery

## 2011-07-01 DIAGNOSIS — M25519 Pain in unspecified shoulder: Secondary | ICD-10-CM

## 2011-07-01 MED ORDER — METHYLPREDNISOLONE ACETATE 40 MG/ML IJ SUSP: 40.0000 mg | Freq: Once | INTRAMUSCULAR | Status: DC

## 2011-07-01 MED ORDER — HYDROCODONE-ACETAMINOPHEN 5-325 MG PO TABS: 1.0000 | ORAL_TABLET | ORAL | Status: AC | PRN

## 2011-07-01 NOTE — Progress Notes (Signed)
Pain LEFT shoulder.Impingement syndrome.  Treatment injection. Nabumetone 500 b.i.d.  Injection pain relief. A 50% still has a catching sensation and pain with forward elevation and pain at night.  GENERAL: normal development   CDV: pulses are normal   Skin: normal  Lymph: deferred  Psychiatric: awake, alert and oriented  Neuro: normal sensation  MSK  Pain with further elevation, positive impingement sign, good strength. Motor grade 5. Neurovascular exam intact. Skin intact  Repeat injection LEFT subacromial space.  Physical therapy. Norco 5 mg  Followup 6 weeks reassess shoulder  Subacromial Shoulder Injection Procedure Note  Pre-operative Diagnosis: left RC Syndrome  Post-operative Diagnosis: same  Indications: pain   Anesthesia: ethyl chloride   Procedure Details   Verbal consent was obtained for the procedure. The shoulder was prepped withalcohol and the skin was anesthetized. A 20 gauge needle was advanced into the subacromial space through posterior approach without difficulty  The space was then injected with 3 ml 1% lidocaine and 1 ml of depomedrol. The injection site was cleansed with isopropyl alcohol and a dressing was applied.  Complications:  None; patient tolerated the procedure well.

## 2011-07-01 NOTE — Progress Notes (Signed)
Addended by: Fuller Canada MD E on: 07/01/2011 04:58 PM   Modules accepted: Orders

## 2011-07-08 ENCOUNTER — Ambulatory Visit (HOSPITAL_COMMUNITY)
Admission: RE | Admit: 2011-07-08 | Discharge: 2011-07-08 | Disposition: A | Discharge status: Home / Self Care | Payer: BC Managed Care – PPO | Source: Ambulatory Visit | Attending: Orthopedic Surgery | Admitting: Orthopedic Surgery

## 2011-07-08 DIAGNOSIS — M6281 Muscle weakness (generalized): Secondary | ICD-10-CM | POA: Insufficient documentation

## 2011-07-08 DIAGNOSIS — IMO0001 Reserved for inherently not codable concepts without codable children: Secondary | ICD-10-CM | POA: Insufficient documentation

## 2011-07-08 DIAGNOSIS — M25819 Other specified joint disorders, unspecified shoulder: Secondary | ICD-10-CM | POA: Insufficient documentation

## 2011-07-08 DIAGNOSIS — M25619 Stiffness of unspecified shoulder, not elsewhere classified: Secondary | ICD-10-CM | POA: Insufficient documentation

## 2011-07-08 DIAGNOSIS — M25519 Pain in unspecified shoulder: Secondary | ICD-10-CM | POA: Insufficient documentation

## 2011-07-08 NOTE — Progress Notes (Signed)
Occupational Therapy Evaluation  Patient Name: Daniel [facility] Date: 07/08/2011 Time In 1108 Time Out 1138 OT Evaluation [id number] Manual Therapy [phone] Visit 1/18 Reassessment date: 08/05/11   HPI: Symptoms/Limitations Symptoms: S:  I have been having pain in my left shoulder for a long time.  My goal is to be pain free." Limitations: History:  Patient was a heating/ac repair/installer for several years.  He states that he began having pain in his left shoulder "a long time ago".  He consulted with Dr. Romeo Apple and has had 2 cortisone injections in his left shoulder.  He states that the injections alleviate the pain by 50% for approximately one week, but then the pain returns.  He has been referred to occupational therapy for evaluation and treatment.   Special Tests: Drop Arm is Negative, Leanord Asal is positive, Painful Arc is positive at 90. Pain Assessment Currently in Pain?: Yes Pain Score:   6 Pain Location: Shoulder Pain Orientation: Left Pain Type: Acute pain Past Medical History:  Past Medical History  Diagnosis Date  . Heart disease   . Arthritis   . SOB (shortness of breath)   . Cancer   . Nerve damage   . Eye pain   . Chills   . Blurred vision   . Blackout    Past Surgical History:  Past Surgical History  Procedure Date  . Cholecystectomy   . Right foot         Prior Functioning  Home Living Additional Comments: lives in a double wide with his wife. 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  propping L arm on truck door, combing hair, reach) Vision - History Baseline Vision: No visual deficits Cognition Orientation Level: Oriented X4 LUE AROM (degrees) Left Shoulder Flexion  0-170: 160 Degrees Left Shoulder ABduction 0-40: 110  Degrees Left Shoulder Internal Rotation  0-70: 45 Degrees assessed with arm in 90 abd Left Shoulder External Rotation  0-90: 70 Degrees assessed with arm in 90 abd LUE Strength Left Shoulder Flexion:  (4+/5) Left Shoulder ABduction:  (4+/5) Left Shoulder Internal Rotation:  (4+/5) Left Shoulder External Rotation:  (4+/5)         Exercise/Treatments  Shoulder Exercises Shoulder Flexion: Supine;PROM;10 reps Shoulder Protraction: Supine;PROM;10 reps Shoulder Horizontal ABduction: Supine;PROM;10 reps Shoulder ABduction: Supine;PROM;10 reps Shoulder External Rotation: Supine;PROM;10 reps Shoulder Internal Rotation: Supine;PROM;10 reps Shoulder Stretch Corner Stretch:  (HEP) Cross Chest Stretch:  (HEP) Wall Stretch:  (HEP)  Manual Therapy Manual Therapy: Myofascial release Myofascial Release: MFR and manual stretching to left upper arm, scapular, and pector region to decrease restrictions and increase pain free range of motion.   Goals Short Term Goals Short Term Goal 1: Independent with HEP. 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 Short Term Goal 3: Decrease pain to 4/10 when reaching out to the side. Short Term Goal 4: Decrease left shoulder fascial restrictions from min-mod to min. Long Term Goals Long Term Goal 1: Return to prior level of independence with all ADLs and leisure activities. Long Term Goal 2: Increase left shoulder AROM to WNL for increased independence reaching overhead. Long Term Goal 3: Increase left shoulder strength to 5/5 for increased independence lifting supplies when camping. Long Term Goal 4: Decrease pain  to 2/10 in left shoulder with activity. Long Term Goal 5: Decrease fascial restrictions to trace in his left shoulder region.   End of Session Patient Active Problem List  Diagnoses  . Other affections of shoulder region, not elsewhere classified  . Pain in joint,  shoulder region  . Muscle weakness (generalized)   End of Session Activity Tolerance: Patient tolerated treatment well General Behavior During Session: [facility] for tasks performed Cognition: [facility] for tasks performed OT Assessment and Plan Clinical Impression Statement: A:  Patient presents with decreased mobility and strength in his left shoulder and increased pain and fascial restrictions.   Prognosis: Excellent OT Frequency: Min 3X/week OT Duration: 6 weeks OT Treatment/Interventions: Other (comment) (modalities as needed.) OT Plan: P:  Treatment Plan:  MFR as stated above.  Ther Ex:  supine:  PROM, AROM shoulder flex, abd, ext rot, int rot, prot, h abd.  seated AROM flex, abd, ext rot, int rot, prot, h abd.  wall wash, thumb tacks, prot/ret//elev/dep, UBE, tband for scapular stability.  Time Calculation Start Time: 1108 Stop Time: 1138 Time Calculation (min): 30 min  Sondra Barges HeleneOTR/L 07/08/2011, 1:33 PM

## 2011-07-10 ENCOUNTER — Ambulatory Visit (HOSPITAL_COMMUNITY)
Admission: RE | Admit: 2011-07-10 | Discharge: 2011-07-10 | Disposition: A | Discharge status: Home / Self Care | Payer: BC Managed Care – PPO | Source: Ambulatory Visit | Attending: Orthopedic Surgery | Admitting: Orthopedic Surgery

## 2011-07-10 DIAGNOSIS — M6281 Muscle weakness (generalized): Secondary | ICD-10-CM

## 2011-07-10 NOTE — Progress Notes (Signed)
Occupational Therapy Treatment  Patient Name: [facility] MRN: [id number] Today's Date: 07/10/2011 Time In 933 Time Out 1018 Manual Therapy [phone] Therapeutic Exercise [phone]  Visit 2/18  Reassessment date: 08/05/11  HPI: Symptoms/Limitations Symptoms: S:  Its still sore.  I tried the exercises. Pain Assessment Currently in Pain?: Yes Pain Score:   6 Pain Location: Shoulder Pain Orientation: Left        Exercise/Treatments Shoulder Exercises Shoulder Flexion: Supine;PROM;AROM;Seated;10 reps (AROM) Therapy Ball (Shoulder Flexion): 15 Shoulder Extension: Theraband;10 reps (green) Shoulder Retraction: Theraband;10 reps (green) Shoulder Protraction: Supine;PROM;AROM;10 reps;Seated (AROM 10 reps) Shoulder Horizontal ABduction: Supine;PROM;AROM;10 reps;Seated (AROM 10 reps) Shoulder ABduction: Supine;PROM;AROM;10 reps;Seated (AROM 10 reps) Therapy Ball (Shoulder Abduction): 15 right and left 5 each Shoulder External Rotation: Supine;PROM;AROM;10 reps;Seated (AROM 10 reps) Theraband Level (Shoulder External Rotation):  (green x 10 reps) Shoulder Internal Rotation: Supine;PROM;AROM;10 reps;Seated (AROM 10 reps) Theraband Level (Shoulder Internal Rotation):  (green x 10) Row: Theraband;10 reps (green) Theraband Level (Row):  (green x 10 reps) Additional ROM/Strengthening Exercises UBE (Upper Arm Bike): 3' and 3' 1.0 Wall Wash: 2 min Thumb Tacks: 1 min Elevation/Depression - Shoulder Pro/Retraction: 1 min  Manual Therapy Myofascial Release: MFR and manual stretching to left upper arm, scapular, and pectoral region to decrease restrictions and increase pain free range of motion.   Goals Short Term Goals Short Term Goal 1: Independent with HEP. 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 Short Term Goal 2 Progress: Progressing toward goal Short  Term Goal 3: Decrease pain to 4/10 when reaching out to the side. Short Term Goal 3 Progress: Progressing toward goal Short Term Goal 4: Decrease left shoulder fascial restrictions from min-mod to min. Short Term Goal 4 Progress: Progressing toward goal Short Term Goal 5 Progress: Progressing toward goal Long Term Goals Long Term Goal 1: Return to prior level of independence with all ADLs and leisure activities. Long Term Goal 2: Increase left shoulder AROM to WNL for increased independence reaching overhead. Long Term Goal 3: Increase left shoulder strength to 5/5 for increased independence lifting supplies when camping. Long Term Goal 4: Decrease pain to 2/10 in left shoulder with activity. Long Term Goal 5: Decrease fascial restrictions to trace in his left shoulder region.   End of Session Patient Active Problem List  Diagnoses  . Other affections of shoulder region, not elsewhere classified  . Pain in joint, shoulder region  . Muscle weakness (generalized)   End of Session Activity Tolerance: Patient tolerated treatment well General Behavior During Session: [facility] for tasks performed Cognition: [facility] for tasks performed OT Assessment and Plan Clinical Impression Statement: A:  Noted burning with wall wash. OT Plan: P:  Add 1 pound to supine exercises.   Shirlean Mylar, OTR/L  07/10/2011, 10:19 AM

## 2011-07-11 ENCOUNTER — Ambulatory Visit (HOSPITAL_COMMUNITY)
Admission: RE | Admit: 2011-07-11 | Discharge: 2011-07-11 | Disposition: A | Discharge status: Home / Self Care | Payer: BC Managed Care – PPO | Source: Ambulatory Visit | Attending: Pulmonary Disease | Admitting: Pulmonary Disease

## 2011-07-11 NOTE — Progress Notes (Signed)
Occupational Therapy Treatment  Patient Name: Daniel Fields MRN: [id number] Today's Date: 07/11/2011   Time In 934 Time Out 1036  Manual Therapy [phone] Therapeutic Exercise [phone]  Visit 3/18  Reassessment date: 08/05/11   HPI: Symptoms/Limitations Symptoms: I am ok, it is not much better. Pain Assessment Currently in Pain?: Yes Pain Score:   5 Pain Location: Shoulder Pain Orientation: Left Pain Type: Acute pain        Exercise/Treatments Shoulder Exercises Shoulder Flexion: PROM;Strengthening;10 reps;Bar weights/barbell;Supine Therapy Ball (Shoulder Flexion): 20 Bar Weights/Barbell (Shoulder Flexion): 1 lb Shoulder Extension: Theraband;15 reps Theraband Level (Shoulder Extension): Level 3 (Green) Shoulder Retraction: Theraband;15 reps Theraband Level (Shoulder Retraction): Level 3 (Green) Shoulder Protraction: PROM;Strengthening;10 reps;Supine;Bar weights/barbell Bar Weights/Barbell (Shoulder Protraction): 1 lb Shoulder Horizontal ABduction: PROM;Strengthening;10 reps;Supine;Bar weights/barbell Shoulder ABduction: PROM;Strengthening;10 reps;Supine;Bar weights/barbell Therapy Ball (Shoulder Abduction): 20 then 5 right and left 5 each Bar Weights/Barbell (Shoulder Abduction): 1 lb Shoulder External Rotation: PROM;Strengthening;10 reps;Supine;Bar weights/barbell Theraband Level (Shoulder External Rotation): Level 3 (Green) Bar Weights/Barbell (Shoulder External Rotation): 1 lb Shoulder Internal Rotation: PROM;Strengthening;10 reps;Supine;Bar weights/barbell Theraband Level (Shoulder Internal Rotation): Level 3 (Green) Bar Weights/Barbell (Shoulder Internal Rotation): 1 lb Row: Theraband;15 reps Theraband Level (Row): Level 3 (Green) Additional ROM/Strengthening Exercises UBE (Upper Arm Bike): 3' and 3' 1.5 Wall Wash: 2 min Thumb Tacks: 1 min Elevation/Depression - Shoulder Pro/Retraction: 1 min Additional Elbow Exercises UBE (Upper Arm Bike): 3' and 3'  1.5 Manual Therapy Manual Therapy: Myofascial release Myofascial Release: MFR and manual stretching to left upper arm, scapular, and pector region to decrease restrictions and increase pain free range of motion.   Goals Short Term Goals Short Term Goal 1: Independent with HEP. 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 Short Term Goal 2 Progress: Progressing toward goal Short Term Goal 3: Decrease pain to 4/10 when reaching out to the side. Short Term Goal 3 Progress: Progressing toward goal Short Term Goal 4: Decrease left shoulder fascial restrictions from min-mod to min. Short Term Goal 4 Progress: Progressing toward goal Long Term Goals Long Term Goal 1: Return to prior level of independence with all ADLs and leisure activities. Long Term Goal 1 Progress: Progressing toward goal Long Term Goal 2: Increase left shoulder AROM to WNL for increased independence reaching overhead. Long Term Goal 2 Progress: Progressing toward goal Long Term Goal 3: Increase left shoulder strength to 5/5 for increased independence lifting supplies when camping. Long Term Goal 3 Progress: Progressing toward goal Long Term Goal 4: Decrease pain to 2/10 in left shoulder with activity. Long Term Goal 4 Progress: Progressing toward goal Long Term Goal 5: Decrease fascial restrictions to trace in his left shoulder region.   Long Term Goal 5 Progress: Progressing toward goal End of Session Patient Active Problem List  Diagnoses  . Other affections of shoulder region, not elsewhere classified  . Pain in joint, shoulder region  . Muscle weakness (generalized)   End of Session Activity Tolerance: Patient tolerated treatment well General Behavior During Session: [facility] for tasks performed OT Assessment and Plan Clinical Impression Statement: error, not elevation seated flexion.   Added 1# to supine exercises which patient tolerated well. Prognosis: Excellent OT Plan: increase reps with supine and seated exercises.     Anton Cheramie L. Alveria Mcglaughlin, COTA/L  07/11/2011, 12:21 PM

## 2011-07-15 ENCOUNTER — Ambulatory Visit (HOSPITAL_COMMUNITY)
Admission: RE | Admit: 2011-07-15 | Discharge: 2011-07-15 | Disposition: A | Discharge status: Home / Self Care | Payer: BC Managed Care – PPO | Source: Ambulatory Visit | Attending: Orthopedic Surgery | Admitting: Orthopedic Surgery

## 2011-07-15 NOTE — Progress Notes (Signed)
Occupational Therapy Treatment  Patient Name: Daniel Fields MRN: [id number] Today's Date: 07/15/2011  Time In 21308  Time Out  1102 Manual Therapy [id number]  Therapeutic Exercise  [id number] Visit 4/18  Reassessment date: 08/05/11   HPI: Symptoms/Limitations Symptoms: It is still hurting some. Pain Assessment Pain Score:   5 Pain Location: Shoulder Pain Orientation: Left Pain Type: Acute pain         Exercise/Treatments    Cervical Exercises  Shoulder Flexion PROM;Strengthening;Bar weights/barbell;Supine;15 reps  Bar Weights/Barbell (Shoulder Flexion) 1 lb  Therapy Ball (Shoulder Flexion) 20  Shoulder Extension Theraband;15 reps  Theraband Level (Shoulder Extension) Level 3 (Green)  Shoulder ABduction PROM;Strengthening;10 reps;Supine;Bar weights/barbell  Bar Weights/Barbell (Shoulder Abduction) 1 lb  Therapy Ball (Shoulder Abduction) 20 then 5 right and left 5 each  Shoulder Horizontal ABduction PROM;Strengthening;Supine;Bar weights/barbell;15 reps  Bar Weights/Barbell (Shoulder Horizontal Abduction) 1 lb  Shoulder Retraction Theraband;15 reps  Theraband Level (Shoulder Retraction) Level 3 (Green)  Row [employer] reps  Theraband Level (Row) Level 3 (Green)  Shoulder Internal Rotation PROM;Strengthening;Supine;Bar weights/barbell;15 reps  Theraband Level (Shoulder Internal Rotation) Level 3 (Green)  Bar Weights/Barbell (Shoulder Internal Rotation) 1 lb  Shoulder External Rotation PROM;Strengthening;Supine;Bar weights/barbell;15 reps  Theraband Level (Shoulder External Rotation) Level 3 (Green)  Bar Weights/Barbell (Shoulder External Rotation) 1 lb  UBE (Upper Arm Bike) 3' and 3' 2.0  Shoulder Exercises  Shoulder Protraction PROM;Strengthening;Supine;Bar weights/barbell;15 reps  Bar Weights/Barbell (Shoulder Protraction) 1 lb  Additional ROM/Strengthening Exercises  Wall Wash 3 min  Thumb Tacks 1 min  Elevation/Depression - Shoulder Pro/Retraction 1 min    Manual Therapy  Manual Therapy Myofascial release  Myofascial Release MFR and manual stretching to left upper arm, scapular, and pector region to decrease restrictions and increase pain free range of motion.     Goals Short Term Goals Short Term Goal 1: Independent with HEP. 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 Short Term Goal 2 Progress: Progressing toward goal Short Term Goal 3: Decrease pain to 4/10 when reaching out to the side. Short Term Goal 3 Progress: Progressing toward goal Short Term Goal 4: Decrease left shoulder fascial restrictions from min-mod to min. Short Term Goal 4 Progress: Progressing toward goal Long Term Goals Long Term Goal 1: Return to prior level of independence with all ADLs and leisure activities. Long Term Goal 2: Increase left shoulder AROM to WNL for increased independence reaching overhead. Long Term Goal 3: Increase left shoulder strength to 5/5 for increased independence lifting supplies when camping. Long Term Goal 4: Decrease pain to 2/10 in left shoulder with activity. Long Term Goal 5: Decrease fascial restrictions to trace in his left shoulder region.   End of Session Patient Active Problem List  Diagnoses  . Other affections of shoulder region, not elsewhere classified  . Pain in joint, shoulder region  . Muscle weakness (generalized)   End of Session Activity Tolerance: Patient tolerated treatment well OT Assessment and Plan Clinical Impression Statement: increased reps and resistance with UBE Prognosis: Good OT Plan: add seated AROM   Adel Burch L. Noralee Stain, COTA/L  07/15/2011, 2:37 PM

## 2011-07-16 ENCOUNTER — Ambulatory Visit (HOSPITAL_COMMUNITY)
Admission: RE | Admit: 2011-07-16 | Discharge: 2011-07-16 | Disposition: A | Discharge status: Home / Self Care | Payer: BC Managed Care – PPO | Source: Ambulatory Visit | Attending: Orthopedic Surgery | Admitting: Orthopedic Surgery

## 2011-07-16 NOTE — Progress Notes (Signed)
Occupational Therapy Treatment  Patient Name: GENE GLAZEBROOK MRN: [id number] Today's Date: 07/16/2011  Time In: 1024 Time Out 1105  Manual Therapy [phone]  Therapeutic Exercise 1040-1105  Visit 16/41  Reassess on 07/18/11    HPI: Symptoms/Limitations Symptoms: It hurts when I put it up on the arm rest and I leave it there for a while. Pain Assessment Currently in Pain?: Yes Pain Score:   5 Pain Location: Shoulder Pain Orientation: Left Pain Type: Acute pain        Exercise/Treatments Shoulder Exercises Shoulder Flexion: PROM;Strengthening;10 reps;Supine;AROM;Seated Therapy Ball (Shoulder Flexion): 20 Bar Weights/Barbell (Shoulder Flexion): 2 lbs Shoulder Extension: Theraband;15 reps Theraband Level (Shoulder Extension): Level 3 (Green) Shoulder Retraction: Theraband;15 reps Theraband Level (Shoulder Retraction): Level 3 (Green) Shoulder Protraction: PROM;Strengthening;10 reps;Supine;AROM;Seated Bar Weights/Barbell (Shoulder Protraction): 2 lbs Shoulder Horizontal ABduction: PROM;Strengthening;10 reps;Supine;AROM;Seated Bar Weights/Barbell (Shoulder Horizontal Abduction): 2 lbs Shoulder ABduction: PROM;Strengthening;10 reps;Supine;AROM;Seated Therapy Ball (Shoulder Abduction): 20 then 5 right and left 5 each Bar Weights/Barbell (Shoulder Abduction): 2 lbs Shoulder External Rotation: PROM;Strengthening;10 reps;Supine;AROM;Seated Theraband Level (Shoulder External Rotation): Level 3 (Green) Bar Weights/Barbell (Shoulder External Rotation): 2 lbs Shoulder Internal Rotation: PROM;Strengthening;10 reps;Supine;AROM;Seated Theraband Level (Shoulder Internal Rotation): Level 3 (Green) Bar Weights/Barbell (Shoulder Internal Rotation): 2 lbs Row: Theraband;15 reps Theraband Level (Row): Level 3 (Green) Additional ROM/Strengthening Exercises UBE (Upper Arm Bike): 3' and 3' 2.5 Wall Wash: 4' Thumb Tacks: 1 min Elevation/Depression - Shoulder Pro/Retraction: 1 min Additional  Elbow Exercises UBE (Upper Arm Bike): 3' and 3' 2.5 Manual Therapy Manual Therapy: Myofascial release Myofascial Release: MFR and manual stretching to left upper arm, scapular, and pector region to decrease restrictions and increase pain free range of motion.   Goals Short Term Goals Short Term Goal 1: Independent with HEP. 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 Short Term Goal 2 Progress: Progressing toward goal Short Term Goal 3: Decrease pain to 4/10 when reaching out to the side. Short Term Goal 3 Progress: Progressing toward goal Short Term Goal 4: Decrease left shoulder fascial restrictions from min-mod to min. Short Term Goal 4 Progress: Partly met Long Term Goals Long Term Goal 1: Return to prior level of independence with all ADLs and leisure activities. Long Term Goal 1 Progress: Progressing toward goal Long Term Goal 2: Increase left shoulder AROM to WNL for increased independence reaching overhead. Long Term Goal 2 Progress: Progressing toward goal Long Term Goal 3: Increase left shoulder strength to 5/5 for increased independence lifting supplies when camping. Long Term Goal 3 Progress: Progressing toward goal Long Term Goal 4: Decrease pain to 2/10 in left shoulder with activity. Long Term Goal 4 Progress: Progressing toward goal Long Term Goal 5: Decrease fascial restrictions to trace in his left shoulder region.   Long Term Goal 5 Progress: Progressing toward goal End of Session Patient Active Problem List  Diagnoses  . Other affections of shoulder region, not elsewhere classified  . Pain in joint, shoulder region  . Muscle weakness (generalized)   End of Session Activity Tolerance: Patient tolerated treatment well OT Assessment and Plan Clinical Impression Statement: increased supine wt to 2# and added seated AROM.  Tactile cues  to keep shoulder depressed during AROM Prognosis: Good OT Plan: increase reps and attempt one pound wt. to seated ex.   Brodric Schauer L. Adria Costley, COTA/L  07/16/2011, 12:08 PM

## 2011-07-18 ENCOUNTER — Ambulatory Visit (HOSPITAL_COMMUNITY)
Admission: RE | Admit: 2011-07-18 | Discharge: 2011-07-18 | Disposition: A | Discharge status: Home / Self Care | Payer: BC Managed Care – PPO | Source: Ambulatory Visit | Attending: Occupational Therapy | Admitting: Occupational Therapy

## 2011-07-18 NOTE — Progress Notes (Addendum)
Occupational Therapy Treatment  Patient Name: Daniel Fields MRN: [id number] Today's Date: 07/18/2011   Time In: 923 Time Out 1014  Manual Therapy [phone]  Therapeutic Exercise [phone]  Visit 17/41  Reassess on 08/05/2011    HPI: Symptoms/Limitations Symptoms: It is still about a 5 Pain Assessment Currently in Pain?: Yes Pain Score:   5 Pain Location: Shoulder Pain Orientation: Upper Pain Type: Acute pain      Exercise/Treatments Shoulder Exercises Shoulder Flexion: PROM;10 reps;Strengthening;15 reps;Supine;Seated Therapy Ball (Shoulder Flexion): 20 Bar Weights/Barbell (Shoulder Flexion): 2 lbs;1 lb (2 supine 1 seated) Shoulder Retraction:  (d/c to hep) Theraband Level (Shoulder Retraction):  (d/c to hep) Shoulder Protraction: PROM;10 reps;Strengthening;15 reps;Supine Bar Weights/Barbell (Shoulder Protraction): 2 lbs;1 lb Shoulder Horizontal ABduction: PROM;10 reps;Strengthening;15 reps;Supine Bar Weights/Barbell (Shoulder Horizontal Abduction): 2 lbs;1 lb (2 supine 1 seated) Shoulder ABduction: PROM;10 reps;Strengthening;15 reps;Supine Bar Weights/Barbell (Shoulder Abduction): 2 lbs;1 lb (2 supine 1 seated) Shoulder External Rotation: PROM;10 reps;Strengthening;15 reps;Supine Theraband Level (Shoulder External Rotation):  (d/c to hep) Bar Weights/Barbell (Shoulder External Rotation): 2 lbs;1 lb (2 supine 1 seated) Shoulder Internal Rotation: PROM;10 reps;Strengthening;15 reps;Supine Bar Weights/Barbell (Shoulder Internal Rotation): 2 lbs;1 lb (2 supine 1 seated) Theraband Level (Row):  (d/c to hep) Additional ROM/Strengthening Exercises UBE (Upper Arm Bike): 3' and 3' 3.0 Wall Wash: 4' Thumb Tacks: 1 min Elevation/Depression - Shoulder Pro/Retraction: 1 min  Modalities Modalities: Ultrasound (1.6 cont. 4 min.) Manual Therapy Manual Therapy: Myofascial release Myofascial Release: MFR and manual stretching to left upper arm, scapular, and pector region to  decrease restrictions and increase pain free range of motion.   Goals Short Term Goals Short Term Goal 1: Independent with HEP. 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 Short Term Goal 2 Progress: Progressing toward goal Short Term Goal 3: Decrease pain to 4/10 when reaching out to the side. Short Term Goal 3 Progress: Progressing toward goal Short Term Goal 4: Decrease left shoulder fascial restrictions from min-mod to min. Short Term Goal 4 Progress: Partly met Long Term Goals Long Term Goal 1: Return to prior level of independence with all ADLs and leisure activities. Long Term Goal 1 Progress: Progressing toward goal Long Term Goal 2: Increase left shoulder AROM to WNL for increased independence reaching overhead. Long Term Goal 2 Progress: Progressing toward goal Long Term Goal 3: Increase left shoulder strength to 5/5 for increased independence lifting supplies when camping. Long Term Goal 3 Progress: Progressing toward goal Long Term Goal 4: Decrease pain to 2/10 in left shoulder with activity. Long Term Goal 4 Progress: Progressing toward goal Long Term Goal 5: Decrease fascial restrictions to trace in his left shoulder region.   Long Term Goal 5 Progress: Progressing toward goal End of Session Patient Active Problem List  Diagnoses  . Other affections of shoulder region, not elsewhere classified  . Pain in joint, shoulder region  . Muscle weakness (generalized)   End of Session Equipment Utilized During Treatment: Ultrasound Activity Tolerance: Patient tolerated treatment well OT Assessment and Plan Clinical Impression Statement: Added ultrasound to decrease pain, no pain relief after ultrasound.  Added t-band to HEP. Prognosis: Good OT Plan: Continue to decrease pain to increase function..   Daniel Fields L. Sophiamarie Nease, COTA/L  07/18/2011, 10:22 AM

## 2011-07-21 ENCOUNTER — Ambulatory Visit (HOSPITAL_COMMUNITY)
Admission: RE | Admit: 2011-07-21 | Discharge: 2011-07-21 | Disposition: A | Discharge status: Home / Self Care | Payer: BC Managed Care – PPO | Source: Ambulatory Visit | Attending: Orthopedic Surgery | Admitting: Orthopedic Surgery

## 2011-07-21 NOTE — Progress Notes (Signed)
Occupational Therapy Treatment  Patient Name: Daniel Fields MRN: [id number] Today's Date: 07/21/2011  Time In: 936  Time Out 1029  Manual Therapy [phone] Therapeutic Exercise [phone] Ultrasound [id number] Visit 18/41  Reassess on 08/05/2011    HPI: Symptoms/Limitations Symptoms: If I rotate back it jumps to a 5. Pain Assessment Currently in Pain?: Yes Pain Score:   5 Pain Location: Shoulder Pain Orientation: Upper Pain Type: Acute pain Pain Onset: More than a month ago Pain Frequency: Constant      Exercise/Treatments Shoulder Exercises Shoulder Flexion: PROM;Strengthening;10 reps;Supine Therapy Ball (Shoulder Flexion): 20 Bar Weights/Barbell (Shoulder Flexion): 3 lbs Shoulder Protraction: PROM;Strengthening;10 reps;Supine Bar Weights/Barbell (Shoulder Protraction): 3 lbs Shoulder Horizontal ABduction: PROM;Strengthening;10 reps;Supine Bar Weights/Barbell (Shoulder Horizontal Abduction): 3 lbs Shoulder ABduction: PROM;Strengthening;10 reps;Supine Therapy Ball (Shoulder Abduction): 20 then 5 right and left 5 each Bar Weights/Barbell (Shoulder Abduction): 3 lbs Shoulder External Rotation: PROM;Strengthening;10 reps;Supine Bar Weights/Barbell (Shoulder External Rotation): 3 lbs Shoulder Internal Rotation: PROM;Strengthening;10 reps;Supine Bar Weights/Barbell (Shoulder Internal Rotation): 3 lbs Additional ROM/Strengthening Exercises UBE (Upper Arm Bike): 3' and 3' 3.5 Wall Wash: 3' with 1# Thumb Tacks: 1 min Elevation/Depression - Shoulder Pro/Retraction: 1 min Modalities Modalities: Ultrasound Manual Therapy Manual Therapy: Myofascial release Myofascial Release: MFR and manual stretching to left upper arm, scapular, and pector region to decrease restrictions and increase pain free range of motion. Ultrasound Ultrasound Location: upper arm. Ultrasound Parameters: 1.6 continuous 5 min. Ultrasound Goals: Pain   Goals Short Term Goals Short Term Goal 1:  Independent with HEP. 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 Short Term Goal 2 Progress: Progressing toward goal Short Term Goal 3: Decrease pain to 4/10 when reaching out to the side. Short Term Goal 3 Progress: Progressing toward goal Short Term Goal 4: Decrease left shoulder fascial restrictions from min-mod to min. Short Term Goal 4 Progress: Progressing toward goal Long Term Goals Long Term Goal 1: Return to prior level of independence with all ADLs and leisure activities. Long Term Goal 1 Progress: Progressing toward goal Long Term Goal 2: Increase left shoulder AROM to WNL for increased independence reaching overhead. Long Term Goal 2 Progress: Progressing toward goal Long Term Goal 3: Increase left shoulder strength to 5/5 for increased independence lifting supplies when camping. Long Term Goal 3 Progress: Progressing toward goal Long Term Goal 4: Decrease pain to 2/10 in left shoulder with activity. Long Term Goal 4 Progress: Progressing toward goal Long Term Goal 5: Decrease fascial restrictions to trace in his left shoulder region.   Long Term Goal 5 Progress: Progressing toward goal End of Session Patient Active Problem List  Diagnoses  . Other affections of shoulder region, not elsewhere classified  . Pain in joint, shoulder region  . Muscle weakness (generalized)   End of Session Activity Tolerance: Patient tolerated treatment well OT Assessment and Plan Clinical Impression Statement: increased supine weight to 3# and seated to 1#, added 1# to wall wash.  Patient fatigued with wall wash Prognosis: Good OT Plan: attempt to increase reps with pre's and time with wall wash.   Taevyn Hausen L. Jacalynn Buzzell, COTA/L  07/21/2011, 10:34 AM

## 2011-07-23 ENCOUNTER — Ambulatory Visit (HOSPITAL_COMMUNITY)
Admission: RE | Admit: 2011-07-23 | Discharge: 2011-07-23 | Disposition: A | Discharge status: Home / Self Care | Payer: BC Managed Care – PPO | Source: Ambulatory Visit | Attending: Occupational Therapy | Admitting: Occupational Therapy

## 2011-07-23 NOTE — Progress Notes (Signed)
Occupational Therapy Treatment  Patient Name: Daniel Fields MRN: [id number] Today's Date: 07/23/2011   Time In: 1006 Time Out 1050  Manual Therapy  [id number]  Therapeutic Exercise 1021-1044 Ultrasound  1044-1050 Visit 19/41  Reassess on 08/05/2011   HPI: Symptoms/Limitations Symptoms: it just hurts when I lift it up Pain Assessment Currently in Pain?: Yes Pain Score:   5 Pain Location: Shoulder Pain Orientation: Upper          Exercise/Treatments Shoulder Exercises Shoulder Flexion: PROM;Strengthening;15 reps;Supine Therapy Ball (Shoulder Flexion): d/c Bar Weights/Barbell (Shoulder Flexion): 3 lbs Shoulder Protraction: PROM;Strengthening;15 reps;Supine;Seated;10 reps Bar Weights/Barbell (Shoulder Protraction): 3 lbs Shoulder Horizontal ABduction: PROM;Strengthening;15 reps;Supine;Seated;10 reps Bar Weights/Barbell (Shoulder Horizontal Abduction): 3 lbs;2 lbs Shoulder ABduction: PROM;Strengthening;15 reps;Supine;Seated;10 reps Therapy Ball (Shoulder Abduction): d/c Bar Weights/Barbell (Shoulder Abduction): 3 lbs;2 lbs Shoulder External Rotation: PROM;Strengthening;15 reps;Supine;Seated;10 reps Bar Weights/Barbell (Shoulder External Rotation): 3 lbs;2 lbs Shoulder Internal Rotation: PROM;Strengthening;15 reps;Supine Bar Weights/Barbell (Shoulder Internal Rotation): 3 lbs;2 lbs Additional ROM/Strengthening Exercises UBE (Upper Arm Bike): 3' and 3' 4.0 Wall Wash: 3' with 1# Thumb Tacks: 1 min Elevation/Depression - Shoulder Pro/Retraction: 1 min Additional Elbow Exercises UBE (Upper Arm Bike): 3' and 3' 4.0 Modalities Modalities: Ultrasound Manual Therapy Myofascial Release: MFR and manual stretching to left upper arm, scapular, and pector region to decrease restrictions and increase pain free range of motion. Ultrasound Ultrasound Location: upper arm Ultrasound Parameters: 1.6. continuous 5 min. Ultrasound Goals: Pain   Goals Short Term Goals Short  Term Goal 1: Independent with HEP. 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 Short Term Goal 2 Progress: Progressing toward goal Short Term Goal 3: Decrease pain to 4/10 when reaching out to the side. Short Term Goal 3 Progress: Progressing toward goal Short Term Goal 4: Decrease left shoulder fascial restrictions from min-mod to min. Short Term Goal 4 Progress: Met Long Term Goals Long Term Goal 1: Return to prior level of independence with all ADLs and leisure activities. Long Term Goal 2: Increase left shoulder AROM to WNL for increased independence reaching overhead. Long Term Goal 2 Progress: Progressing toward goal Long Term Goal 3: Increase left shoulder strength to 5/5 for increased independence lifting supplies when camping. Long Term Goal 3 Progress: Progressing toward goal Long Term Goal 4: Decrease pain to 2/10 in left shoulder with activity. Long Term Goal 4 Progress: Progressing toward goal Long Term Goal 5: Decrease fascial restrictions to trace in his left shoulder region.   Long Term Goal 5 Progress: Progressing toward goal End of Session Patient Active Problem List  Diagnoses  . Other affections of shoulder region, not elsewhere classified  . Pain in joint, shoulder region  . Muscle weakness (generalized)   End of Session Equipment Utilized During Treatment: ultrasound Activity Tolerance: Patient tolerated treatment well OT Assessment and Plan Clinical Impression Statement: continues to fatigue during wall wash.  Increased seated to 2#.   Prognosis: Good OT Plan: if no change in pain from ultrasound, explore other pain decresing measures.   Rylin Seavey L. Tyshia Fenter, COTA/L  07/23/2011, 11:13 AM

## 2011-07-25 ENCOUNTER — Ambulatory Visit (HOSPITAL_COMMUNITY)
Admission: RE | Admit: 2011-07-25 | Discharge: 2011-07-25 | Disposition: A | Discharge status: Home / Self Care | Payer: BC Managed Care – PPO | Source: Ambulatory Visit | Attending: Pulmonary Disease | Admitting: Pulmonary Disease

## 2011-07-25 NOTE — Progress Notes (Addendum)
Occupational Therapy Treatment  Patient Name: Daniel Fields MRN: [id number] Today's Date: 07/25/2011   Time In: 1110 Time Out  1210 Manual Therapy  7829-5621 Therapeutic Exercise 1129-1155 Iontophoresis 3086-5784 Visit 21/41  Reassess on 08/05/2011      HPI: Pain Assessment Pain Score:   5 Pain Location: Shoulder Pain Orientation: Upper Pain Frequency:  (with movement)     Exercise/Treatments Shoulder Exercises Shoulder Flexion: PROM;Strengthening;15 reps;Supine;Seated Bar Weights/Barbell (Shoulder Flexion): 3 lbs Shoulder Protraction: PROM;Strengthening;15 reps;Supine Bar Weights/Barbell (Shoulder Protraction): 3 lbs Shoulder Horizontal ABduction: PROM;Strengthening;15 reps;Supine;Seated Bar Weights/Barbell (Shoulder Horizontal Abduction): 3 lbs Shoulder ABduction: PROM;Strengthening;15 reps;Supine;Seated Bar Weights/Barbell (Shoulder Abduction): 3 lbs Shoulder External Rotation: PROM;Strengthening;15 reps;Supine;Seated Bar Weights/Barbell (Shoulder External Rotation): 3 lbs Shoulder Internal Rotation: PROM;Strengthening;15 reps;Supine;Seated Bar Weights/Barbell (Shoulder Internal Rotation): 3 lbs Additional ROM/Strengthening Exercises UBE (Upper Arm Bike): 3' and 3' 4.0 Wall Wash: 4' with 1# Thumb Tacks: 1 min Elevation/Depression - Shoulder Pro/Retraction: 1 min Modalities Modalities: Iontophoresis (60 dose, 3.8 current) Manual Therapy Manual Therapy: Myofascial release Myofascial Release: MFR and manual stretching to left upper arm, scapular, and pector region to decrease restrictions and increase pain free range of motion.   Goals Short Term Goals Short Term Goal 1: Independent with HEP. 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 Short Term Goal 2 Progress: Progressing toward goal Short Term Goal 3: Decrease pain to 4/10 when reaching out to the  side. Short Term Goal 3 Progress: Progressing toward goal Short Term Goal 4: Decrease left shoulder fascial restrictions from min-mod to min. Short Term Goal 4 Progress: Met Long Term Goals Long Term Goal 1: Return to prior level of independence with all ADLs and leisure activities. Long Term Goal 1 Progress: Progressing toward goal Long Term Goal 2: Increase left shoulder AROM to WNL for increased independence reaching overhead. Long Term Goal 2 Progress: Progressing toward goal Long Term Goal 3: Increase left shoulder strength to 5/5 for increased independence lifting supplies when camping. Long Term Goal 3 Progress: Progressing toward goal Long Term Goal 4: Decrease pain to 2/10 in left shoulder with activity. Long Term Goal 4 Progress: Progressing toward goal Long Term Goal 5: Decrease fascial restrictions to trace in his left shoulder region.   Long Term Goal 5 Progress: Progressing toward goal End of Session Patient Active Problem List  Diagnoses  . Other affections of shoulder region, not elsewhere classified  . Pain in joint, shoulder region  . Muscle weakness (generalized)   End of Session Equipment Utilized During Treatment: ionto with dex. Activity Tolerance: Patient tolerated treatment well General Behavior During Session: [facility] for tasks performed OT Assessment and Plan Clinical Impression Statement: began Ionto with dex. for pain management Prognosis: Good OT Plan: add chest press and row and bodyblade   Rainie Crenshaw L. Lauraine Crespo, COTA/L  07/25/2011, 1:22 PM

## 2011-07-28 ENCOUNTER — Ambulatory Visit (HOSPITAL_COMMUNITY)
Admission: RE | Admit: 2011-07-28 | Discharge: 2011-07-28 | Disposition: A | Discharge status: Home / Self Care | Payer: BC Managed Care – PPO | Source: Ambulatory Visit | Attending: Orthopedic Surgery | Admitting: Orthopedic Surgery

## 2011-07-28 NOTE — Progress Notes (Addendum)
Occupational Therapy Treatment  Patient Name: Daniel Fields MRN: [id number] Today's Date: 07/28/2011  Time In: 457  Time Out  545 Manual Therapy 457-509  Therapeutic Exercise 509-536   Iontophoresis 536- 551 Visit 22/41  Reassess on 08/05/2011   HPI: Symptoms/Limitations Symptoms: It feels ok down but when I lift it, it is a 4 or 5 Pain Assessment Currently in Pain?: Yes Pain Score:   5 Pain Location: Shoulder Pain Orientation: Left            Exercise/Treatments Shoulder Exercises Shoulder Flexion: PROM;Strengthening;15 reps;Seated Bar Weights/Barbell (Shoulder Flexion): 3 lbs Shoulder Protraction: PROM;Strengthening;15 reps;Seated Bar Weights/Barbell (Shoulder Protraction): 3 lbs Shoulder Horizontal ABduction: PROM;Strengthening;15 reps;Seated Bar Weights/Barbell (Shoulder Horizontal Abduction): 3 lbs Shoulder ABduction: PROM;Strengthening;15 reps;Seated Bar Weights/Barbell (Shoulder Abduction): 3 lbs Shoulder External Rotation: PROM;Strengthening;15 reps;Seated Bar Weights/Barbell (Shoulder External Rotation): 3 lbs Shoulder Internal Rotation: PROM;Strengthening;15 reps;Seated Bar Weights/Barbell (Shoulder Internal Rotation): 3 lbs Additional ROM/Strengthening Exercises UBE (Upper Arm Bike): 3' and 3' 4.0 Wall Wash: 4' with 1# Thumb Tacks: 1 min Elevation/Depression - Shoulder Pro/Retraction: 1 min Additional Elbow Exercises UBE (Upper Arm Bike): 3' and 3' 4.0 Modalities Modalities: Iontophoresis Manual Therapy Manual Therapy: Myofascial release Myofascial Release: MFR and manual stretching to left upper arm, scapular, and pector region to decrease restrictions and increase pain free range of motion. Iontophoresis Type of Iontophoresis: Dexamethasone Location: left shoulder Dose: 60 Time: 15   Goals Short Term Goals Short Term Goal 1: Independent with HEP. 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 Short Term Goal 2 Progress: Progressing toward goal Short Term Goal 3: Decrease pain to 4/10 when reaching out to the side. Short Term Goal 3 Progress: Progressing toward goal Short Term Goal 4: Decrease left shoulder fascial restrictions from min-mod to min. Short Term Goal 4 Progress: Met Long Term Goals Long Term Goal 1: Return to prior level of independence with all ADLs and leisure activities. Long Term Goal 1 Progress: Progressing toward goal Long Term Goal 2: Increase left shoulder AROM to WNL for increased independence reaching overhead. Long Term Goal 2 Progress: Progressing toward goal Long Term Goal 3: Increase left shoulder strength to 5/5 for increased independence lifting supplies when camping. Long Term Goal 3 Progress: Progressing toward goal Long Term Goal 4: Decrease pain to 2/10 in left shoulder with activity. Long Term Goal 4 Progress: Progressing toward goal Long Term Goal 5: Decrease fascial restrictions to trace in his left shoulder region.   Long Term Goal 5 Progress: Progressing toward goal End of Session Patient Active Problem List  Diagnoses  . Other affections of shoulder region, not elsewhere classified  . Pain in joint, shoulder region  . Muscle weakness (generalized)   End of Session Equipment Utilized During Treatment: ionto Activity Tolerance: Patient tolerated treatment well OT Assessment and Plan Clinical Impression Statement: began cybex press and row Prognosis: Good OT Plan: attempt bodyblade  Kaelie Henigan L. Noralee Stain, COTA/L  07/28/2011, 5:44 PM

## 2011-07-30 ENCOUNTER — Ambulatory Visit (HOSPITAL_COMMUNITY)
Admission: RE | Admit: 2011-07-30 | Discharge: 2011-07-30 | Disposition: A | Discharge status: Home / Self Care | Payer: BC Managed Care – PPO | Source: Ambulatory Visit | Attending: Occupational Therapy | Admitting: Occupational Therapy

## 2011-07-30 DIAGNOSIS — M6281 Muscle weakness (generalized): Secondary | ICD-10-CM

## 2011-07-30 NOTE — Progress Notes (Addendum)
Occupational Therapy Treatment  Patient Name: Daniel Fields MRN: [id number] Today's Date: 07/30/2011   Time In: 847 Time Out  937 Manual Therapy [phone] 901 Therapeutic Exercise 901-922  Iontophoresis 922-937  Visit 23/41  Reassess on 08/05/2011    HPI: Symptoms/Limitations Symptoms: Not changed it is about the same when I lift it but it feels better down by my side, that used to hurt too. Pain Assessment Pain Score:   5 Pain Location: Shoulder Pain Orientation: Right    Exercise/Treatments Shoulder Exercises Shoulder Flexion: PROM;Strengthening;15 reps;Seated Bar Weights/Barbell (Shoulder Flexion): 3 lbs Shoulder Protraction: PROM;Strengthening;15 reps;Seated Bar Weights/Barbell (Shoulder Protraction): 3 lbs Shoulder Horizontal ABduction: PROM;Strengthening;15 reps;Seated Bar Weights/Barbell (Shoulder Horizontal Abduction): 3 lbs Shoulder ABduction: PROM;Strengthening;15 reps;Seated Bar Weights/Barbell (Shoulder Abduction): 3 lbs Shoulder External Rotation: PROM;Strengthening;15 reps;Seated Bar Weights/Barbell (Shoulder External Rotation): 3 lbs Shoulder Internal Rotation: PROM;Strengthening;15 reps;Seated Bar Weights/Barbell (Shoulder Internal Rotation): 3 lbs Additional ROM/Strengthening Exercises Cybex Leg Press: 2 1/2 plates x 15 Cybex Row: 2 1/2 plates x 15 Wall Wash: 3' with 2# Thumb Tacks: 1 min Elevation/Depression - Shoulder Pro/Retraction: 1 min Additional Elbow Exercises Cybex Leg Press: 2 1/2 plates x 15 Cybex Row: 2 1/2 plates x 15 Manual Therapy Manual Therapy: Myofascial release Myofascial Release: MFR and manual stretching to left upper arm, scapular, and pector region to decrease restrictions and increase pain free range of motion.   Goals Short Term Goals Short Term Goal 1: Independent with HEP. 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 Short Term Goal 2 Progress: Progressing toward goal Short Term Goal 3: Decrease pain to 4/10 when reaching out to the side. Short Term Goal 3 Progress: Progressing toward goal Short Term Goal 4: Decrease left shoulder fascial restrictions from min-mod to min. Short Term Goal 4 Progress: Met Long Term Goals Long Term Goal 1: Return to prior level of independence with all ADLs and leisure activities. Long Term Goal 1 Progress: Progressing toward goal Long Term Goal 2: Increase left shoulder AROM to WNL for increased independence reaching overhead. Long Term Goal 2 Progress: Progressing toward goal Long Term Goal 3: Increase left shoulder strength to 5/5 for increased independence lifting supplies when camping. Long Term Goal 3 Progress: Progressing toward goal Long Term Goal 4: Decrease pain to 2/10 in left shoulder with activity. Long Term Goal 4 Progress: Progressing toward goal Long Term Goal 5: Decrease fascial restrictions to trace in his left shoulder region.   Long Term Goal 5 Progress: Progressing toward goal End of Session Patient Active Problem List  Diagnoses  . Other affections of shoulder region, not elsewhere classified  . Pain in joint, shoulder region  . Muscle weakness (generalized)       Takenya Travaglini L. Ajani Rineer, COTA/L  07/30/2011, 9:27 AM

## 2011-08-01 ENCOUNTER — Ambulatory Visit (HOSPITAL_COMMUNITY): Payer: BC Managed Care – PPO | Admitting: Occupational Therapy

## 2011-08-12 ENCOUNTER — Encounter: Payer: Self-pay | Admitting: Orthopedic Surgery

## 2011-08-12 ENCOUNTER — Ambulatory Visit (INDEPENDENT_AMBULATORY_CARE_PROVIDER_SITE_OTHER): Payer: BC Managed Care – PPO | Admitting: Orthopedic Surgery

## 2011-08-12 DIAGNOSIS — M67919 Unspecified disorder of synovium and tendon, unspecified shoulder: Secondary | ICD-10-CM

## 2011-08-12 DIAGNOSIS — M75102 Unspecified rotator cuff tear or rupture of left shoulder, not specified as traumatic: Secondary | ICD-10-CM

## 2011-08-12 NOTE — Patient Instructions (Signed)
He will be scheduled for an MRI of the shoulder.  We will schedule an appointment for you to come back and review the MRI with the physician.  Continue Norco for pain, continue home exercises to maintain flexibility and shoulder.

## 2011-08-12 NOTE — Progress Notes (Signed)
Followup visit  Diagnosis LEFT rotator cuff syndrome  Previous treatment hydrocodone, and hematoma, cortisone injections x2, physical therapy  The patient is 50% pain relief but has plateaued at that point.  He has pain with forward elevation above 120.  Review of systems neck pain as well.  Exam shows a well-developed well-nourished male in no intact skin is normal with no rash or ulcer.  Cervical area is devoid of any lymphadenopathy  He has full forward elevation active and passive.  He has pain at 120 forward flexion.  The pain is at the anterolateral border of the acromion and in the soft tissue and the deltoid.  Strength of the rotator cuff still remains relatively good.  I would grade it as a 5 minus over 5.  Shoulder is stable.  He does have some decreased rotation of the cervical spine and tenderness at the base of the cervical spine  Overall impression rotator cuff syndrome LEFT shoulder.  He says he can try to deal with it at this point but if it gets worse he would like to have something done.  I think he would need a decompression he does not appear to have a tear.  Whether or not he needs an MRI before surgery is debatable.  He will follow up with [place] in 3 months to reassess her shoulder and will continue to take hydrocodone for pain.

## 2011-08-13 ENCOUNTER — Telehealth: Payer: Self-pay | Admitting: *Deleted

## 2011-08-13 NOTE — Telephone Encounter (Signed)
Is this patient to have MRI or not, I did not understand note

## 2011-09-12 LAB — COMPREHENSIVE METABOLIC PANEL
AST: 37
Albumin: 4.7
Alkaline Phosphatase: 84
Chloride: 107
GFR calc Af Amer: >60
Potassium: 3.8
Sodium: 137
Total Bilirubin: 1.1
Total Protein: 6.9

## 2011-09-12 LAB — DIFFERENTIAL
Basophils Absolute: 0
Basophils Relative: 0
Eosinophils Relative: 3
Monocytes Absolute: 0.5
Monocytes Relative: 7

## 2011-09-12 LAB — CBC
HCT: 48.1
Platelets: 127 — ABNORMAL LOW
RDW: 14
WBC: 8.4

## 2011-11-12 ENCOUNTER — Encounter: Payer: Self-pay | Admitting: Orthopedic Surgery

## 2011-11-12 ENCOUNTER — Ambulatory Visit (INDEPENDENT_AMBULATORY_CARE_PROVIDER_SITE_OTHER): Payer: BC Managed Care – PPO | Admitting: Orthopedic Surgery

## 2011-11-12 DIAGNOSIS — M25819 Other specified joint disorders, unspecified shoulder: Secondary | ICD-10-CM

## 2011-11-12 DIAGNOSIS — M75102 Unspecified rotator cuff tear or rupture of left shoulder, not specified as traumatic: Secondary | ICD-10-CM

## 2011-11-12 DIAGNOSIS — M719 Bursopathy, unspecified: Secondary | ICD-10-CM

## 2011-11-12 DIAGNOSIS — M25519 Pain in unspecified shoulder: Secondary | ICD-10-CM

## 2011-11-12 DIAGNOSIS — M67919 Unspecified disorder of synovium and tendon, unspecified shoulder: Secondary | ICD-10-CM

## 2011-11-12 NOTE — Patient Instructions (Signed)
Home exercises   Take the medication as needed

## 2011-11-12 NOTE — Progress Notes (Signed)
   Followup.  Last seen 3 months ago.  Patient has LEFT shoulder impingement syndrome, treated with physical therapy, pain medication, injection. Still has pain, LEFT shoulder, anterolateral at 90, abduction, and with abduction, external rotation. Has a history of 3 stents. Just saw his cardiologist has some occasional shortness of breath. He scheduled for some blood work today or tomorrow.  We discussed possible options at this time. Because he can tolerate the pain in the medicines, working, somewhat, we've decided to avoid surgery secondary to his stance. As long as he can tolerate the pain and use the medicine and we will avoid any surgery.  The exam shows He is awake and alert. He is oriented x3.  he has an intact rotator cuff. In terms of strength. He has painful crepitus at 120 of forward elevation. Shoulder is stable. Skin normal pulses, good no lymph nodes are palpable. Sensation is normal.  I also looked at his hip x-rays and may have joint space preservation with mild arthritis.  He is complaining of some anterior hip pain. Of course, I will be a larger operation and is not on the bone were having severe pain. There as well, so we can wait.  Impression bilateral shoulder pain, LEFT greater than RIGHT, previous treatment seems to be holding him At this point.  Followup p.r.n.

## 2012-05-12 DIAGNOSIS — E782 Mixed hyperlipidemia: Secondary | ICD-10-CM | POA: Diagnosis not present

## 2012-05-12 DIAGNOSIS — I2510 Atherosclerotic heart disease of native coronary artery without angina pectoris: Secondary | ICD-10-CM | POA: Diagnosis not present

## 2012-05-12 DIAGNOSIS — R079 Chest pain, unspecified: Secondary | ICD-10-CM | POA: Diagnosis not present

## 2012-05-12 DIAGNOSIS — I10 Essential (primary) hypertension: Secondary | ICD-10-CM | POA: Diagnosis not present

## 2012-06-25 DIAGNOSIS — J449 Chronic obstructive pulmonary disease, unspecified: Secondary | ICD-10-CM | POA: Diagnosis not present

## 2012-06-25 DIAGNOSIS — G4730 Sleep apnea, unspecified: Secondary | ICD-10-CM | POA: Diagnosis not present

## 2012-06-25 DIAGNOSIS — K2100 Gastro-esophageal reflux disease with esophagitis, without bleeding: Secondary | ICD-10-CM | POA: Diagnosis not present

## 2012-06-25 DIAGNOSIS — M1990 Unspecified osteoarthritis, unspecified site: Secondary | ICD-10-CM | POA: Diagnosis not present

## 2012-12-22 ENCOUNTER — Ambulatory Visit (HOSPITAL_COMMUNITY)
Admission: RE | Admit: 2012-12-22 | Discharge: 2012-12-22 | Disposition: A | Discharge status: Home / Self Care | Payer: BC Managed Care – PPO | Source: Ambulatory Visit | Attending: Pulmonary Disease | Admitting: Pulmonary Disease

## 2012-12-22 ENCOUNTER — Other Ambulatory Visit (HOSPITAL_COMMUNITY): Payer: Self-pay | Admitting: Pulmonary Disease

## 2012-12-22 DIAGNOSIS — N509 Disorder of male genital organs, unspecified: Secondary | ICD-10-CM | POA: Insufficient documentation

## 2012-12-22 DIAGNOSIS — R109 Unspecified abdominal pain: Secondary | ICD-10-CM | POA: Diagnosis not present

## 2012-12-22 DIAGNOSIS — K5730 Diverticulosis of large intestine without perforation or abscess without bleeding: Secondary | ICD-10-CM | POA: Diagnosis not present

## 2012-12-22 DIAGNOSIS — Z8547 Personal history of malignant neoplasm of testis: Secondary | ICD-10-CM | POA: Insufficient documentation

## 2012-12-22 DIAGNOSIS — C6290 Malignant neoplasm of unspecified testis, unspecified whether descended or undescended: Secondary | ICD-10-CM

## 2012-12-22 MED ORDER — IOHEXOL 300 MG/ML  SOLN
100.0000 mL | Freq: Once | INTRAMUSCULAR | Status: AC | PRN
  Administered 2012-12-22: 100 mL via INTRAVENOUS

## 2012-12-24 DIAGNOSIS — E782 Mixed hyperlipidemia: Secondary | ICD-10-CM | POA: Diagnosis not present

## 2012-12-24 DIAGNOSIS — I10 Essential (primary) hypertension: Secondary | ICD-10-CM | POA: Diagnosis not present

## 2012-12-24 DIAGNOSIS — I2510 Atherosclerotic heart disease of native coronary artery without angina pectoris: Secondary | ICD-10-CM | POA: Diagnosis not present

## 2012-12-24 DIAGNOSIS — R002 Palpitations: Secondary | ICD-10-CM | POA: Diagnosis not present

## 2013-03-07 ENCOUNTER — Other Ambulatory Visit (HOSPITAL_COMMUNITY): Payer: Self-pay | Admitting: Urology

## 2013-03-07 DIAGNOSIS — N390 Urinary tract infection, site not specified: Secondary | ICD-10-CM

## 2013-03-07 DIAGNOSIS — N453 Epididymo-orchitis: Secondary | ICD-10-CM | POA: Diagnosis not present

## 2013-03-10 ENCOUNTER — Ambulatory Visit (HOSPITAL_COMMUNITY)
Admission: RE | Admit: 2013-03-10 | Discharge: 2013-03-10 | Disposition: A | Discharge status: Home / Self Care | Payer: BC Managed Care – PPO | Source: Ambulatory Visit | Attending: Urology | Admitting: Urology

## 2013-03-10 ENCOUNTER — Other Ambulatory Visit (HOSPITAL_COMMUNITY): Payer: Self-pay | Admitting: Urology

## 2013-03-10 DIAGNOSIS — Z8547 Personal history of malignant neoplasm of testis: Secondary | ICD-10-CM | POA: Diagnosis not present

## 2013-03-10 DIAGNOSIS — N390 Urinary tract infection, site not specified: Secondary | ICD-10-CM

## 2013-03-10 DIAGNOSIS — N433 Hydrocele, unspecified: Secondary | ICD-10-CM | POA: Diagnosis not present

## 2013-03-10 DIAGNOSIS — N509 Disorder of male genital organs, unspecified: Secondary | ICD-10-CM | POA: Diagnosis not present

## 2013-03-10 DIAGNOSIS — Z9079 Acquired absence of other genital organ(s): Secondary | ICD-10-CM | POA: Diagnosis not present

## 2013-03-14 DIAGNOSIS — N390 Urinary tract infection, site not specified: Secondary | ICD-10-CM | POA: Diagnosis not present

## 2013-03-14 DIAGNOSIS — N453 Epididymo-orchitis: Secondary | ICD-10-CM | POA: Diagnosis not present

## 2013-07-25 ENCOUNTER — Ambulatory Visit (INDEPENDENT_AMBULATORY_CARE_PROVIDER_SITE_OTHER): Payer: BC Managed Care – PPO | Admitting: Gastroenterology

## 2013-07-25 ENCOUNTER — Encounter (HOSPITAL_COMMUNITY): Payer: Self-pay | Admitting: Pharmacy Technician

## 2013-07-25 ENCOUNTER — Encounter: Payer: Self-pay | Admitting: Gastroenterology

## 2013-07-25 ENCOUNTER — Other Ambulatory Visit: Payer: Self-pay | Admitting: Internal Medicine

## 2013-07-25 VITALS — BP 130/77 | HR 70 | Temp 97.4°F | Ht 70.0 in | Wt 222.2 lb

## 2013-07-25 DIAGNOSIS — Z1211 Encounter for screening for malignant neoplasm of colon: Secondary | ICD-10-CM | POA: Insufficient documentation

## 2013-07-25 DIAGNOSIS — K219 Gastro-esophageal reflux disease without esophagitis: Secondary | ICD-10-CM | POA: Insufficient documentation

## 2013-07-25 DIAGNOSIS — R1032 Left lower quadrant pain: Secondary | ICD-10-CM | POA: Diagnosis not present

## 2013-07-25 DIAGNOSIS — R1030 Lower abdominal pain, unspecified: Secondary | ICD-10-CM | POA: Insufficient documentation

## 2013-07-25 MED ORDER — PEG 3350-KCL-NA BICARB-NACL 420 G PO SOLR: 4000.0000 mL | ORAL | Status: DC

## 2013-07-25 MED ORDER — SODIUM CHLORIDE 0.9 % IV SOLN: INTRAVENOUS | Status: DC

## 2013-07-25 NOTE — Assessment & Plan Note (Signed)
57 year old gentleman who presents for scheduling of colonoscopy. His last one was when he was in his 92s. States he was diagnosed with irritable bowel syndrome at that time. Overall he states his bowel movements are fine. He has daily bilateral lower abdominal pain which improves after a bowel movement. Recent episode of left lower corner pain which lasted for several days and resolved on its own. Recommend colonoscopy at this time. We will augment conscious sedation with Phenergan 25 mg IV 30 minutes for the procedure given his history of chronic narcotic use.  I have discussed the risks, alternatives, benefits with regards to but not limited to the risk of reaction to medication, bleeding, infection, perforation and the patient is agreeable to proceed. Written consent to be obtained.

## 2013-07-25 NOTE — Patient Instructions (Addendum)
1. Colonoscopy and upper endoscopy with Dr. Jena Gauss in the near future. Please see separate instructions.

## 2013-07-25 NOTE — Assessment & Plan Note (Signed)
Chronic GERD on PPI for greater than 5 years. States he cannot miss a single dose without having recurrent "terrible" heartburn and abdominal pain. No prior upper endoscopy. Recommend EGD at this time to evaluate for Barrett's esophagus. Augment conscious sedation with Phenergan 25 mg IV 30 minutes before the procedure given history of chronic narcotic use.  I have discussed the risks, alternatives, benefits with regards to but not limited to the risk of reaction to medication, bleeding, infection, perforation and the patient is agreeable to proceed. Written consent to be obtained.  Continue pantoprazole daily.

## 2013-07-25 NOTE — Progress Notes (Signed)
CC'd to PCP 

## 2013-07-25 NOTE — Progress Notes (Signed)
Primary Care Physician:  HAWKINS,EDWARD L, MD  Primary Gastroenterologist:  Michael Rourk, MD   Chief Complaint  Patient presents with  . Colonoscopy    HPI:  Daniel Fields is a 57 y.o. male here to schedule colonoscopy. He has chronic lower abdominal pain which she states has been occurring since chemotherapy for testicular cancer over 10 years ago. States his diagnosed with IBS when he was in his 20s. Had a procedure at that time. Generally has lower bowel pain each day. Tends to get worse if he has to use the bathroom. Some improvement after bowel movement. Generally has 1 bowel movement per day. No melena or rectal bleeding. Last weekend, left lower quadrant pain, lasted for four days. First episode like this, resolved on its on. History of diverticulosis on prior CT but never had diverticulitis. No weight loss. Heartburn controled on pantoprazole. For more than 5 years. No dysphagia. No prior EGD.   CT of the abdomen and pelvis with contrast January 2014 Impression: Multiple colonic diverticula primarily concentrated within the rectosigmoid colon. No diverticulitis seen.? Small left hydrocele. Prior right orchiectomy.  Current Outpatient Prescriptions  Medication Sig Dispense Refill  . allopurinol (ZYLOPRIM) 300 MG tablet Take 300 mg by mouth daily.       . aspirin 325 MG tablet 325 mg.        . atorvastatin (LIPITOR) 10 MG tablet Take 10 mg by mouth daily.       . diltiazem (CARDIZEM CD) 180 MG 24 hr capsule Take 180 mg by mouth daily.       . folic acid (FOLVITE) 1 MG tablet Take 1 mg by mouth daily.       . HYDROcodone-acetaminophen (NORCO) 7.5-325 MG per tablet 1 tablet every 6 (six) hours as needed.       . metoprolol (LOPRESSOR) 50 MG tablet Take 50 mg by mouth 2 (two) times daily.       . nitroGLYCERIN (NITROSTAT) 0.4 MG SL tablet Place 0.4 mg under the tongue every 5 (five) minutes as needed.       . pantoprazole (PROTONIX) 40 MG tablet Take 40 mg by mouth daily.       .  potassium chloride SA (K-DUR,KLOR-CON) 20 MEQ tablet Take 20 mEq by mouth 2 (two) times daily.       . ramipril (ALTACE) 10 MG tablet Take 10 mg by mouth daily.        No current facility-administered medications for this visit.    Allergies as of 07/25/2013  . (No Known Allergies)    Past Medical History  Diagnosis Date  . Heart disease   . Arthritis   . SOB (shortness of breath)   . Cancer     testicular surgery, s/p resection and chemotherapy, age early 40s  . Nerve damage   . Eye pain   . Chills   . Blurred vision   . Blackout     vision problem, not syncope  . Gout   . CAD (coronary artery disease)   . COPD (chronic obstructive pulmonary disease)   . Sleep apnea     CPAP  . IBS (irritable bowel syndrome)     diagnosed in his 20s    Past Surgical History  Procedure Laterality Date  . Cholecystectomy    . Right foot    . Coronary angioplasty with stent placement  2004    3 total stents  . Surgery scrotal / testicular      right testicular   cancer    Family History  Problem Relation Age of Onset  . Colon cancer Neg Hx   . Crohn's disease Neg Hx   . Ulcerative colitis Neg Hx   . Breast cancer Cousin     2    History   Social History  . Marital Status: Married    Spouse Name: N/A    Number of Children: N/A  . Years of Education: N/A   Occupational History  . Not on file.   Social History Main Topics  . Smoking status: Current Every Day Smoker -- 1.00 packs/day  . Smokeless tobacco: Not on file  . Alcohol Use: No  . Drug Use: No  . Sexual Activity: Not on file   Other Topics Concern  . Not on file   Social History Narrative  . No narrative on file      ROS:  General: Negative for anorexia, weight loss, fever, chills, fatigue, weakness. Eyes: Negative for vision changes.  ENT: Negative for hoarseness, difficulty swallowing , nasal congestion. CV: Negative for chest pain, angina, palpitations, dyspnea on exertion, peripheral edema.   Respiratory: Negative for dyspnea at rest, dyspnea on exertion, cough, sputum, wheezing.  GI: See history of present illness. GU:  Negative for dysuria, hematuria, urinary incontinence, urinary frequency, nocturnal urination.  MS: Chronic pain.  Derm: Negative for rash or itching.  Neuro: Negative for weakness, abnormal sensation, seizure, frequent headaches, memory loss, confusion.  Psych: Negative for anxiety, depression, suicidal ideation, hallucinations.  Endo: Negative for unusual weight change.  Heme: Negative for bruising or bleeding. Allergy: Negative for rash or hives.    Physical Examination:  BP 130/77  Pulse 70  Temp(Src) 97.4 F (36.3 C) (Oral)  Ht 5' 10" (1.778 m)  Wt 222 lb 3.2 oz (100.789 kg)  BMI 31.88 kg/m2   General: Well-nourished, well-developed in no acute distress.  Head: Normocephalic, atraumatic.   Eyes: Conjunctiva pink, no icterus. Mouth: Oropharyngeal mucosa moist and pink , no lesions erythema or exudate. Neck: Supple without thyromegaly, masses, or lymphadenopathy.  Lungs: Clear to auscultation bilaterally.  Heart: Regular rate and rhythm, no murmurs rubs or gallops.  Abdomen: Bowel sounds are normal, nontender, nondistended, no hepatosplenomegaly or masses, no abdominal bruits or    hernia , no rebound or guarding.   Rectal: Not performed Extremities: No lower extremity edema. No clubbing or deformities.  Neuro: Alert and oriented x 4 , grossly normal neurologically.  Skin: Warm and dry, no rash or jaundice.   Psych: Alert and cooperative, normal mood and affect.   

## 2013-07-28 ENCOUNTER — Encounter (HOSPITAL_COMMUNITY): Payer: Self-pay | Admitting: Pharmacy Technician

## 2013-07-29 ENCOUNTER — Ambulatory Visit (HOSPITAL_COMMUNITY)
Admission: RE | Admit: 2013-07-29 | Discharge: 2013-07-29 | Disposition: A | Discharge status: Home / Self Care | Payer: BC Managed Care – PPO | Source: Ambulatory Visit | Attending: Internal Medicine | Admitting: Internal Medicine

## 2013-07-29 ENCOUNTER — Encounter (HOSPITAL_COMMUNITY): Payer: Self-pay | Admitting: *Deleted

## 2013-07-29 ENCOUNTER — Encounter (HOSPITAL_COMMUNITY)
Admission: RE | Disposition: A | Discharge status: Home / Self Care | Payer: Self-pay | Source: Ambulatory Visit | Attending: Internal Medicine

## 2013-07-29 DIAGNOSIS — J449 Chronic obstructive pulmonary disease, unspecified: Secondary | ICD-10-CM | POA: Insufficient documentation

## 2013-07-29 DIAGNOSIS — D128 Benign neoplasm of rectum: Secondary | ICD-10-CM | POA: Insufficient documentation

## 2013-07-29 DIAGNOSIS — K219 Gastro-esophageal reflux disease without esophagitis: Secondary | ICD-10-CM | POA: Insufficient documentation

## 2013-07-29 DIAGNOSIS — D126 Benign neoplasm of colon, unspecified: Secondary | ICD-10-CM

## 2013-07-29 DIAGNOSIS — Z1211 Encounter for screening for malignant neoplasm of colon: Secondary | ICD-10-CM

## 2013-07-29 DIAGNOSIS — K621 Rectal polyp: Secondary | ICD-10-CM

## 2013-07-29 DIAGNOSIS — J4489 Other specified chronic obstructive pulmonary disease: Secondary | ICD-10-CM | POA: Insufficient documentation

## 2013-07-29 DIAGNOSIS — R1032 Left lower quadrant pain: Secondary | ICD-10-CM

## 2013-07-29 DIAGNOSIS — K5730 Diverticulosis of large intestine without perforation or abscess without bleeding: Secondary | ICD-10-CM

## 2013-07-29 DIAGNOSIS — R933 Abnormal findings on diagnostic imaging of other parts of digestive tract: Secondary | ICD-10-CM

## 2013-07-29 DIAGNOSIS — K620 Anal polyp: Secondary | ICD-10-CM

## 2013-07-29 DIAGNOSIS — K2970 Gastritis, unspecified, without bleeding: Secondary | ICD-10-CM | POA: Diagnosis not present

## 2013-07-29 DIAGNOSIS — K2940 Chronic atrophic gastritis without bleeding: Secondary | ICD-10-CM | POA: Insufficient documentation

## 2013-07-29 HISTORY — PX: COLONOSCOPY WITH ESOPHAGOGASTRODUODENOSCOPY (EGD): SHX5779

## 2013-07-29 SURGERY — COLONOSCOPY WITH ESOPHAGOGASTRODUODENOSCOPY (EGD)
Anesthesia: Moderate Sedation

## 2013-07-29 MED ORDER — PROMETHAZINE HCL 25 MG/ML IJ SOLN
INTRAMUSCULAR | Status: AC
  Filled 2013-07-29: qty 1

## 2013-07-29 MED ORDER — SODIUM CHLORIDE 0.9 % IJ SOLN
INTRAMUSCULAR | Status: AC
  Filled 2013-07-29: qty 10

## 2013-07-29 MED ORDER — ONDANSETRON HCL 4 MG/2ML IJ SOLN
INTRAMUSCULAR | Status: DC | PRN
  Administered 2013-07-29: 4 mg via INTRAVENOUS

## 2013-07-29 MED ORDER — ONDANSETRON HCL 4 MG/2ML IJ SOLN
INTRAMUSCULAR | Status: AC
  Filled 2013-07-29: qty 2

## 2013-07-29 MED ORDER — BUTAMBEN-TETRACAINE-BENZOCAINE 2-2-14 % EX AERO
INHALATION_SPRAY | CUTANEOUS | Status: DC | PRN
  Administered 2013-07-29: 2 via TOPICAL

## 2013-07-29 MED ORDER — MIDAZOLAM HCL 5 MG/5ML IJ SOLN
INTRAMUSCULAR | Status: AC
  Filled 2013-07-29: qty 10

## 2013-07-29 MED ORDER — PROMETHAZINE HCL 25 MG/ML IJ SOLN
25.0000 mg | Freq: Once | INTRAMUSCULAR | Status: AC
Start: 2013-07-29 — End: 2013-07-29
  Administered 2013-07-29: 25 mg via INTRAVENOUS

## 2013-07-29 MED ORDER — SODIUM CHLORIDE 0.9 % IV SOLN
INTRAVENOUS | Status: DC
  Administered 2013-07-29: 12:00:00 via INTRAVENOUS

## 2013-07-29 MED ORDER — MEPERIDINE HCL 100 MG/ML IJ SOLN
INTRAMUSCULAR | Status: AC
  Filled 2013-07-29: qty 2

## 2013-07-29 MED ORDER — MIDAZOLAM HCL 5 MG/5ML IJ SOLN
INTRAMUSCULAR | Status: DC | PRN
  Administered 2013-07-29: 1 mg via INTRAVENOUS
  Administered 2013-07-29: 2 mg via INTRAVENOUS
  Administered 2013-07-29: 1 mg via INTRAVENOUS
  Administered 2013-07-29: 2 mg via INTRAVENOUS

## 2013-07-29 MED ORDER — STERILE WATER FOR IRRIGATION IR SOLN
Status: DC | PRN
  Administered 2013-07-29: 12:00:00

## 2013-07-29 MED ORDER — MEPERIDINE HCL 100 MG/ML IJ SOLN
INTRAMUSCULAR | Status: DC | PRN
  Administered 2013-07-29 (×2): 50 mg via INTRAVENOUS
  Administered 2013-07-29 (×2): 25 mg via INTRAVENOUS

## 2013-07-29 NOTE — Op Note (Signed)
[facility] [address] [place], [address]   ENDOSCOPY PROCEDURE REPORT  PATIENT: Daniel Fields, Daniel Fields  MR#: [id number] BIRTHDATE: [birth year] , 57  yrs. old GENDER: Male ENDOSCOPIST: R.  Roetta Sessions, MD FACP FACG REFERRED BY:  Kari Baars, M.D. PROCEDURE DATE:  07/29/2013 PROCEDURE:     EGD with gastric biopsy  INDICATIONS:     Long-standing GERD  INFORMED CONSENT:   The risks, benefits, limitations, alternatives and imponderables have been discussed.  The potential for biopsy, esophogeal dilation, etc. have also been reviewed.  Questions have been answered.  All parties agreeable.  Please see the history and physical in the medical record for more information.  MEDICATIONS:   Phenergan 25 mg and  Versed 5 mg IV and Demerol 125 mg IV and Zofran 4 mg. Cetacaine spray.  DESCRIPTION OF PROCEDURE:   The EG-[id number] ([id number]) and [id number] ([id number])  endoscope was introduced through the mouth and advanced to the second portion of the duodenum without difficulty or limitations.  The mucosal surfaces were surveyed very carefully during advancement of the scope and upon withdrawal.  Retroflexion view of the proximal stomach and esophagogastric junction was performed.      FINDINGS:   Normal esophagus. Stomach empty. Antral nodularity and erosions. No ulcer or infiltrating process. Patent pylorus. Normal first and second portion of the duodenum.  THERAPEUTIC / DIAGNOSTIC MANEUVERS PERFORMED:  Biopsies abnormal gastric mucosa taken for histologic study   COMPLICATIONS:  None  IMPRESSION:  Normal esophagus. Abnormal gastric mucosa of uncertain significance-status post biopsy  RECOMMENDATIONS:    Followup on pathology. See colonoscopy report.    _______________________________ R. Roetta Sessions, MD FACP [facility] eSigned:  R. Roetta Sessions, MD FACP [facility] 07/29/2013 12:51 PM     CC:

## 2013-07-29 NOTE — H&P (View-Only) (Signed)
Primary Care Physician:  Fredirick Maudlin, MD  Primary Gastroenterologist:  Roetta Sessions, MD   Chief Complaint  Patient presents with  . Colonoscopy    HPI:  Daniel Fields is a 57 y.o. male here to schedule colonoscopy. He has chronic lower abdominal pain which she states has been occurring since chemotherapy for testicular cancer over 10 years ago. States his diagnosed with IBS when he was in his 26s. Had a procedure at that time. Generally has lower bowel pain each day. Tends to get worse if he has to use the bathroom. Some improvement after bowel movement. Generally has 1 bowel movement per day. No melena or rectal bleeding. Last weekend, left lower quadrant pain, lasted for four days. First episode like this, resolved on its on. History of diverticulosis on prior CT but never had diverticulitis. No weight loss. Heartburn controled on pantoprazole. For more than 5 years. No dysphagia. No prior EGD.   CT of the abdomen and pelvis with contrast January 2014 Impression: Multiple colonic diverticula primarily concentrated within the rectosigmoid colon. No diverticulitis seen.? Small left hydrocele. Prior right orchiectomy.  Current Outpatient Prescriptions  Medication Sig Dispense Refill  . allopurinol (ZYLOPRIM) 300 MG tablet Take 300 mg by mouth daily.       Marland Kitchen aspirin 325 MG tablet 325 mg.        . atorvastatin (LIPITOR) 10 MG tablet Take 10 mg by mouth daily.       Marland Kitchen diltiazem (CARDIZEM CD) 180 MG 24 hr capsule Take 180 mg by mouth daily.       . folic acid (FOLVITE) 1 MG tablet Take 1 mg by mouth daily.       Marland Kitchen HYDROcodone-acetaminophen (NORCO) 7.5-325 MG per tablet 1 tablet every 6 (six) hours as needed.       . metoprolol (LOPRESSOR) 50 MG tablet Take 50 mg by mouth 2 (two) times daily.       . nitroGLYCERIN (NITROSTAT) 0.4 MG SL tablet Place 0.4 mg under the tongue every 5 (five) minutes as needed.       . pantoprazole (PROTONIX) 40 MG tablet Take 40 mg by mouth daily.       .  potassium chloride SA (K-DUR,KLOR-CON) 20 MEQ tablet Take 20 mEq by mouth 2 (two) times daily.       . ramipril (ALTACE) 10 MG tablet Take 10 mg by mouth daily.        No current facility-administered medications for this visit.    Allergies as of 07/25/2013  . (No Known Allergies)    Past Medical History  Diagnosis Date  . Heart disease   . Arthritis   . SOB (shortness of breath)   . Cancer     testicular surgery, s/p resection and chemotherapy, age early 45s  . Nerve damage   . Eye pain   . Chills   . Blurred vision   . Blackout     vision problem, not syncope  . Gout   . CAD (coronary artery disease)   . COPD (chronic obstructive pulmonary disease)   . Sleep apnea     CPAP  . IBS (irritable bowel syndrome)     diagnosed in his 73s    Past Surgical History  Procedure Laterality Date  . Cholecystectomy    . Right foot    . Coronary angioplasty with stent placement  2004    3 total stents  . Surgery scrotal / testicular      right testicular  cancer    Family History  Problem Relation Age of Onset  . Colon cancer Neg Hx   . Crohn's disease Neg Hx   . Ulcerative colitis Neg Hx   . Breast cancer Cousin     2    History   Social History  . Marital Status: Married    Spouse Name: N/A    Number of Children: N/A  . Years of Education: N/A   Occupational History  . Not on file.   Social History Main Topics  . Smoking status: Current Every Day Smoker -- 1.00 packs/day  . Smokeless tobacco: Not on file  . Alcohol Use: No  . Drug Use: No  . Sexual Activity: Not on file   Other Topics Concern  . Not on file   Social History Narrative  . No narrative on file      ROS:  General: Negative for anorexia, weight loss, fever, chills, fatigue, weakness. Eyes: Negative for vision changes.  ENT: Negative for hoarseness, difficulty swallowing , nasal congestion. CV: Negative for chest pain, angina, palpitations, dyspnea on exertion, peripheral edema.   Respiratory: Negative for dyspnea at rest, dyspnea on exertion, cough, sputum, wheezing.  GI: See history of present illness. GU:  Negative for dysuria, hematuria, urinary incontinence, urinary frequency, nocturnal urination.  MS: Chronic pain.  Derm: Negative for rash or itching.  Neuro: Negative for weakness, abnormal sensation, seizure, frequent headaches, memory loss, confusion.  Psych: Negative for anxiety, depression, suicidal ideation, hallucinations.  Endo: Negative for unusual weight change.  Heme: Negative for bruising or bleeding. Allergy: Negative for rash or hives.    Physical Examination:  BP 130/77  Pulse 70  Temp(Src) 97.4 F (36.3 C) (Oral)  Ht 5\' 10"  (1.778 m)  Wt 222 lb 3.2 oz (100.789 kg)  BMI 31.88 kg/m2   General: Well-nourished, well-developed in no acute distress.  Head: Normocephalic, atraumatic.   Eyes: Conjunctiva pink, no icterus. Mouth: Oropharyngeal mucosa moist and pink , no lesions erythema or exudate. Neck: Supple without thyromegaly, masses, or lymphadenopathy.  Lungs: Clear to auscultation bilaterally.  Heart: Regular rate and rhythm, no murmurs rubs or gallops.  Abdomen: Bowel sounds are normal, nontender, nondistended, no hepatosplenomegaly or masses, no abdominal bruits or    hernia , no rebound or guarding.   Rectal: Not performed Extremities: No lower extremity edema. No clubbing or deformities.  Neuro: Alert and oriented x 4 , grossly normal neurologically.  Skin: Warm and dry, no rash or jaundice.   Psych: Alert and cooperative, normal mood and affect.

## 2013-07-29 NOTE — Interval H&P Note (Signed)
History and Physical Interval Note:  07/29/2013 12:23 PM  Abdalla A Savage  has presented today for surgery, with the diagnosis of CHRONIC GERD, LLQ PAIN AND SCREENING COLONOSCOPY  The various methods of treatment have been discussed with the patient and family. After consideration of risks, benefits and other options for treatment, the patient has consented to  Procedure(s) with comments: COLONOSCOPY WITH ESOPHAGOGASTRODUODENOSCOPY (EGD) (N/A) - 12:45 as a surgical intervention .  The patient's history has been reviewed, patient examined, no change in status, stable for surgery.  I have reviewed the patient's chart and labs.  Questions were answered to the patient's satisfaction.      No change. EGD colonoscopy per plan.The risks, benefits, limitations, imponderables and alternatives regarding both EGD and colonoscopy have been reviewed with the patient. Questions have been answered. All parties agreeable.   Eula Listen

## 2013-07-29 NOTE — Op Note (Signed)
[facility] [address] [place], [address]   COLONOSCOPY PROCEDURE REPORT  PATIENT: Daniel Fields, Daniel Fields  MR#:         [id number] BIRTHDATE: 06-30-1956 , 57  yrs. old GENDER: Male ENDOSCOPIST: R.  Roetta Sessions, MD FACP FACG REFERRED BY:  Kari Baars, M.D. PROCEDURE DATE:  07/29/2013 PROCEDURE:     Colonoscopy with multiple snare polypectomies  INDICATIONS: History of left lower quadrant abdominal pain; overdue for colorectal cancer screening.  INFORMED CONSENT:  The risks, benefits, alternatives and imponderables including but not limited to bleeding, perforation as well as the possibility of a missed lesion have been reviewed.  The potential for biopsy, lesion removal, etc. have also been discussed.  Questions have been answered.  All parties agreeable. Please see the history and physical in the medical record for more information.  MEDICATIONS Versed :   Versed 6 mg IV and Demerol 150 mg IV in divided doses.. Phenergan 25 mg IV. Zofran 4 mg IV  DESCRIPTION OF PROCEDURE:  After a digital rectal exam was performed, the EG-[id number] ([id number]) and EC-[id number] ([id number]) colonoscope was advanced from the anus through the rectum and colon to the area of the cecum, ileocecal valve and appendiceal orifice. The cecum was deeply intubated.  These structures were well-seen and photographed for the record.  From the level of the cecum and ileocecal valve, the scope was slowly and cautiously withdrawn. The mucosal surfaces were carefully surveyed utilizing scope tip deflection to facilitate fold flattening as needed.  The scope was pulled down into the rectum where a thorough examination including retroflexion was performed.    FINDINGS:  Adequate preparation. (1.25 cm) pedunculated polyp in the rectum at 15 cm from the anal verge; otherwise, the remainder of rectal colonic mucosa appeared normal. The patient has extensive left-sided and transverse diverticula;The colon  was loated with polyps.  There were multiple 4 mm to 10 mm polyps throughout the ascending, descending, hepatic flexure and  sigmoid segments.   THERAPEUTIC / DIAGNOSTIC MANEUVERS PERFORMED:  Multiple hot and cold snare polypectomies were performed, removing the polyps found in the colon and rectum .  COMPLICATIONS: none  CECAL WITHDRAWAL TIME:  27 minutes  IMPRESSION:  Multiple colorectal polyps-removed as described above. Colonic diverticulosis.  RECOMMENDATIONS: Followup on pathology. Begin Benefiber 2 teaspoons twice daily. See EGD report   _______________________________ eSigned:  R. Roetta Sessions, MD FACP [facility] 07/29/2013 1:45 PM   CC:

## 2013-08-03 ENCOUNTER — Encounter (HOSPITAL_COMMUNITY): Payer: Self-pay | Admitting: Internal Medicine

## 2013-08-03 ENCOUNTER — Encounter: Payer: Self-pay | Admitting: Internal Medicine

## 2013-08-06 ENCOUNTER — Other Ambulatory Visit: Payer: Self-pay | Admitting: Cardiovascular Disease

## 2013-08-09 NOTE — Telephone Encounter (Signed)
Rx was sent to pharmacy electronically. 

## 2013-11-06 ENCOUNTER — Other Ambulatory Visit: Payer: Self-pay | Admitting: Cardiovascular Disease

## 2013-12-22 DIAGNOSIS — I1310 Hypertensive heart and chronic kidney disease without heart failure, with stage 1 through stage 4 chronic kidney disease, or unspecified chronic kidney disease: Secondary | ICD-10-CM | POA: Diagnosis not present

## 2013-12-22 DIAGNOSIS — M545 Low back pain, unspecified: Secondary | ICD-10-CM | POA: Diagnosis not present

## 2013-12-22 DIAGNOSIS — I259 Chronic ischemic heart disease, unspecified: Secondary | ICD-10-CM | POA: Diagnosis not present

## 2013-12-22 DIAGNOSIS — E785 Hyperlipidemia, unspecified: Secondary | ICD-10-CM | POA: Diagnosis not present

## 2014-01-05 ENCOUNTER — Other Ambulatory Visit: Payer: Self-pay | Admitting: Cardiovascular Disease

## 2014-01-05 NOTE — Telephone Encounter (Signed)
Rx was sent to pharmacy electronically. 

## 2014-01-19 ENCOUNTER — Emergency Department (HOSPITAL_COMMUNITY)
Admission: EM | Admit: 2014-01-19 | Discharge: 2014-01-19 | Disposition: A | Discharge status: Home / Self Care | Payer: BC Managed Care – PPO | Attending: Emergency Medicine | Admitting: Emergency Medicine

## 2014-01-19 ENCOUNTER — Encounter (HOSPITAL_COMMUNITY): Payer: Self-pay | Admitting: Emergency Medicine

## 2014-01-19 ENCOUNTER — Emergency Department (HOSPITAL_COMMUNITY): Payer: BC Managed Care – PPO

## 2014-01-19 DIAGNOSIS — F17200 Nicotine dependence, unspecified, uncomplicated: Secondary | ICD-10-CM | POA: Insufficient documentation

## 2014-01-19 DIAGNOSIS — S4980XA Other specified injuries of shoulder and upper arm, unspecified arm, initial encounter: Secondary | ICD-10-CM | POA: Insufficient documentation

## 2014-01-19 DIAGNOSIS — S39012A Strain of muscle, fascia and tendon of lower back, initial encounter: Secondary | ICD-10-CM

## 2014-01-19 DIAGNOSIS — J4489 Other specified chronic obstructive pulmonary disease: Secondary | ICD-10-CM | POA: Insufficient documentation

## 2014-01-19 DIAGNOSIS — S335XXA Sprain of ligaments of lumbar spine, initial encounter: Secondary | ICD-10-CM | POA: Insufficient documentation

## 2014-01-19 DIAGNOSIS — Y92410 Unspecified street and highway as the place of occurrence of the external cause: Secondary | ICD-10-CM | POA: Insufficient documentation

## 2014-01-19 DIAGNOSIS — M109 Gout, unspecified: Secondary | ICD-10-CM | POA: Insufficient documentation

## 2014-01-19 DIAGNOSIS — G4730 Sleep apnea, unspecified: Secondary | ICD-10-CM | POA: Insufficient documentation

## 2014-01-19 DIAGNOSIS — Z8719 Personal history of other diseases of the digestive system: Secondary | ICD-10-CM | POA: Insufficient documentation

## 2014-01-19 DIAGNOSIS — Z9861 Coronary angioplasty status: Secondary | ICD-10-CM | POA: Insufficient documentation

## 2014-01-19 DIAGNOSIS — Z9981 Dependence on supplemental oxygen: Secondary | ICD-10-CM | POA: Insufficient documentation

## 2014-01-19 DIAGNOSIS — Z8547 Personal history of malignant neoplasm of testis: Secondary | ICD-10-CM | POA: Insufficient documentation

## 2014-01-19 DIAGNOSIS — Y9389 Activity, other specified: Secondary | ICD-10-CM | POA: Insufficient documentation

## 2014-01-19 DIAGNOSIS — Z9221 Personal history of antineoplastic chemotherapy: Secondary | ICD-10-CM | POA: Insufficient documentation

## 2014-01-19 DIAGNOSIS — S139XXA Sprain of joints and ligaments of unspecified parts of neck, initial encounter: Secondary | ICD-10-CM | POA: Diagnosis not present

## 2014-01-19 DIAGNOSIS — I2510 Atherosclerotic heart disease of native coronary artery without angina pectoris: Secondary | ICD-10-CM | POA: Insufficient documentation

## 2014-01-19 DIAGNOSIS — S161XXA Strain of muscle, fascia and tendon at neck level, initial encounter: Secondary | ICD-10-CM

## 2014-01-19 DIAGNOSIS — J449 Chronic obstructive pulmonary disease, unspecified: Secondary | ICD-10-CM | POA: Insufficient documentation

## 2014-01-19 DIAGNOSIS — Z7982 Long term (current) use of aspirin: Secondary | ICD-10-CM | POA: Insufficient documentation

## 2014-01-19 DIAGNOSIS — Z8669 Personal history of other diseases of the nervous system and sense organs: Secondary | ICD-10-CM | POA: Insufficient documentation

## 2014-01-19 DIAGNOSIS — S46909A Unspecified injury of unspecified muscle, fascia and tendon at shoulder and upper arm level, unspecified arm, initial encounter: Secondary | ICD-10-CM | POA: Insufficient documentation

## 2014-01-19 DIAGNOSIS — Z79899 Other long term (current) drug therapy: Secondary | ICD-10-CM | POA: Insufficient documentation

## 2014-01-19 MED ORDER — NAPROXEN 500 MG PO TABS: 500.0000 mg | ORAL_TABLET | Freq: Two times a day (BID) | ORAL | Status: DC
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
## 2014-01-19 NOTE — Discharge Instructions (Signed)
Follow up with your md next week for recheck if not improving.

## 2014-01-19 NOTE — ED Notes (Signed)
Pt c/o neck pain but ambulated to room well. c-collar in place.
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
## 2014-02-03 ENCOUNTER — Other Ambulatory Visit: Payer: Self-pay | Admitting: Cardiovascular Disease

## 2014-02-07 DIAGNOSIS — M545 Low back pain, unspecified: Secondary | ICD-10-CM | POA: Diagnosis not present

## 2014-02-07 DIAGNOSIS — J209 Acute bronchitis, unspecified: Secondary | ICD-10-CM | POA: Diagnosis not present

## 2014-02-07 DIAGNOSIS — M542 Cervicalgia: Secondary | ICD-10-CM | POA: Diagnosis not present

## 2014-03-06 ENCOUNTER — Other Ambulatory Visit: Payer: Self-pay | Admitting: Cardiovascular Disease

## 2014-03-17 ENCOUNTER — Encounter (HOSPITAL_COMMUNITY): Payer: Self-pay | Admitting: Emergency Medicine

## 2014-03-17 ENCOUNTER — Emergency Department (HOSPITAL_COMMUNITY): Payer: BC Managed Care – PPO

## 2014-03-17 ENCOUNTER — Emergency Department (HOSPITAL_COMMUNITY)
Admission: EM | Admit: 2014-03-17 | Discharge: 2014-03-18 | Disposition: A | Discharge status: Home / Self Care | Payer: BC Managed Care – PPO | Attending: Emergency Medicine | Admitting: Emergency Medicine

## 2014-03-17 DIAGNOSIS — R5381 Other malaise: Secondary | ICD-10-CM | POA: Insufficient documentation

## 2014-03-17 DIAGNOSIS — G4730 Sleep apnea, unspecified: Secondary | ICD-10-CM | POA: Insufficient documentation

## 2014-03-17 DIAGNOSIS — I2510 Atherosclerotic heart disease of native coronary artery without angina pectoris: Secondary | ICD-10-CM | POA: Diagnosis not present

## 2014-03-17 DIAGNOSIS — R110 Nausea: Secondary | ICD-10-CM | POA: Insufficient documentation

## 2014-03-17 DIAGNOSIS — R5383 Other fatigue: Secondary | ICD-10-CM

## 2014-03-17 DIAGNOSIS — Z8547 Personal history of malignant neoplasm of testis: Secondary | ICD-10-CM | POA: Insufficient documentation

## 2014-03-17 DIAGNOSIS — R42 Dizziness and giddiness: Secondary | ICD-10-CM | POA: Insufficient documentation

## 2014-03-17 DIAGNOSIS — M109 Gout, unspecified: Secondary | ICD-10-CM | POA: Diagnosis not present

## 2014-03-17 DIAGNOSIS — F17200 Nicotine dependence, unspecified, uncomplicated: Secondary | ICD-10-CM | POA: Insufficient documentation

## 2014-03-17 DIAGNOSIS — R61 Generalized hyperhidrosis: Secondary | ICD-10-CM | POA: Insufficient documentation

## 2014-03-17 DIAGNOSIS — Z7982 Long term (current) use of aspirin: Secondary | ICD-10-CM | POA: Diagnosis not present

## 2014-03-17 DIAGNOSIS — Z9981 Dependence on supplemental oxygen: Secondary | ICD-10-CM | POA: Insufficient documentation

## 2014-03-17 DIAGNOSIS — R55 Syncope and collapse: Secondary | ICD-10-CM | POA: Insufficient documentation

## 2014-03-17 DIAGNOSIS — Z9861 Coronary angioplasty status: Secondary | ICD-10-CM | POA: Diagnosis not present

## 2014-03-17 DIAGNOSIS — J449 Chronic obstructive pulmonary disease, unspecified: Secondary | ICD-10-CM | POA: Diagnosis not present

## 2014-03-17 DIAGNOSIS — Z9221 Personal history of antineoplastic chemotherapy: Secondary | ICD-10-CM | POA: Insufficient documentation

## 2014-03-17 DIAGNOSIS — J4489 Other specified chronic obstructive pulmonary disease: Secondary | ICD-10-CM | POA: Insufficient documentation

## 2014-03-17 LAB — CBC
HEMATOCRIT: 44.5 % (ref 39.0–52.0)
Hemoglobin: 16.5 g/dL (ref 13.0–17.0)
MCH: 32.4 pg (ref 26.0–34.0)
MCHC: 37.1 g/dL — ABNORMAL HIGH (ref 30.0–36.0)
MCV: 87.3 fL (ref 78.0–100.0)
Platelets: 121 10*3/uL — ABNORMAL LOW (ref 150–400)
RBC: 5.1 MIL/uL (ref 4.22–5.81)
RDW: 13.9 % (ref 11.5–15.5)
WBC: 8.7 10*3/uL (ref 4.0–10.5)

## 2014-03-17 LAB — BASIC METABOLIC PANEL
BUN: 14 mg/dL (ref 6–23)
CHLORIDE: 100 meq/L (ref 96–112)
CO2: 28 mEq/L (ref 19–32)
Calcium: 9.4 mg/dL (ref 8.4–10.5)
Creatinine, Ser: 1.15 mg/dL (ref 0.50–1.35)
GFR calc Af Amer: 80 mL/min — ABNORMAL LOW (ref >90)
GFR calc non Af Amer: 69 mL/min — ABNORMAL LOW (ref >90)
GLUCOSE: 112 mg/dL — AB (ref 70–99)
POTASSIUM: 3.7 meq/L (ref 3.7–5.3)
Sodium: 140 mEq/L (ref 137–147)

## 2014-03-17 LAB — TROPONIN I: Troponin I: <0.3 ng/mL (ref <0.30)

## 2014-03-17 MED ORDER — ASPIRIN 81 MG PO CHEW
324.0000 mg | CHEWABLE_TABLET | Freq: Once | ORAL | Status: AC
  Administered 2014-03-17: 324 mg via ORAL
  Filled 2014-03-17: qty 4
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
## 2014-03-17 NOTE — ED Notes (Signed)
Today around 4 pm I started having numbness that is becoming better, also felt like I was going to pass out. Patient states that he is feeling better but still feels slow and weak.

## 2014-03-18 LAB — TROPONIN I: Troponin I: <0.3 ng/mL (ref <0.30)

## 2014-03-18 NOTE — Discharge Instructions (Signed)
Near-Syncope °Near-syncope (commonly known as near fainting) is sudden weakness, dizziness, or feeling like you might pass out. During an episode of near-syncope, you may also develop pale skin, have tunnel vision, or feel sick to your stomach (nauseous). Near-syncope may occur when getting up after sitting or while standing for a long time. It is caused by a sudden decrease in blood flow to the brain. This decrease can result from various causes or triggers, most of which are not serious. However, because near-syncope can sometimes be a sign of something serious, a medical evaluation is required. The specific cause is often not determined. °HOME CARE INSTRUCTIONS  °Monitor your condition for any changes. The following actions may help to alleviate any discomfort you are experiencing: °· Have someone stay with you until you feel stable. °· Lie down right away if you start feeling like you might faint. Breathe deeply and steadily. Wait until all the symptoms have passed. Most of these episodes last only a few minutes. You may feel tired for several hours.   °· Drink enough fluids to keep your urine clear or pale yellow.   °· If you are taking blood pressure or heart medicine, get up slowly when seated or lying down. Take several minutes to sit and then stand. This can reduce dizziness. °· Follow up with your health care provider as directed.  °SEEK IMMEDIATE MEDICAL CARE IF:  °· You have a severe headache.   °· You have unusual pain in the chest, abdomen, or back.   °· You are bleeding from the mouth or rectum, or you have black or tarry stool.   °· You have an irregular or very fast heartbeat.   °· You have repeated fainting or have seizure-like jerking during an episode.   °· You faint when sitting or lying down.   °· You have confusion.   °· You have difficulty walking.   °· You have severe weakness.   °· You have vision problems.   °MAKE SURE YOU:  °· Understand these instructions. °· Will watch your  condition. °· Will get help right away if you are not doing well or get worse. °Document Released: 11/17/2005 Document Revised: 07/20/2013 Document Reviewed: 04/22/2013 °[employer]® Patient Information ©2014 [employer]. ° °

## 2014-03-18 NOTE — ED Provider Notes (Signed)
CSN: [id number]     Arrival date & time 03/17/14  2104 History   First MD Initiated Contact with Patient 03/17/14 2124     Chief Complaint  Patient presents with  . Numbness     (Consider location/radiation/quality/duration/timing/severity/associated sxs/prior Treatment) HPI Comments: [name] is a 58 y.o. Male who describes weakness,  Lightheadedness and nausea without emesis this afternoon around 4 pm. He walked into a fast food restaurant to pick up a meal for a family member and as he stood in line, he reports feeling lightheaded, nauseated and had a "flood" of a generalized weakness and sensation of numbness from his neck to his abdomen including both shoulders.  He denies having shortness of breath, chest pain, headache, visual changes, but had diaphoresis.  He reports having to sit down in the restaurant for about 10 minutes before he felt well enough to go to his car, stating he might have passed out if he had remained standing.  He went home and as he sat outside in the cool air he felt improved,  But his symptoms lingered for about 2 hours.  His last meal eaten was this morning, but had just a light snack for lunch.  He eventually ate dinner around 7 pm and now feels washed out, but otherwise symptom free.  He does have a history of CAD with prior stent placement. He is followed by Dr. Gwenlyn Found for this condition.  He has had no medicines prior to arrival.  He does take aspirin daily but has not taken yet today.       The history is provided by the patient.    Past Medical History  Diagnosis Date  . Heart disease   . Arthritis   . SOB (shortness of breath)   . Cancer     testicular surgery, s/p resection and chemotherapy, age early 31s  . Nerve damage   . Eye pain   . Chills   . Blurred vision   . Blackout     vision problem, not syncope  . Gout   . CAD (coronary artery disease)   . COPD (chronic obstructive pulmonary disease)   . Sleep apnea     CPAP  . IBS  (irritable bowel syndrome)     diagnosed in his 51s   Past Surgical History  Procedure Laterality Date  . Cholecystectomy    . Right foot    . Coronary angioplasty with stent placement  2004    3 total stents  . Surgery scrotal / testicular      right testicular cancer  . Colonoscopy with esophagogastroduodenoscopy (egd) N/A 07/29/2013    Procedure: COLONOSCOPY WITH ESOPHAGOGASTRODUODENOSCOPY (EGD);  Surgeon: Daneil Dolin, MD;  Location: [facility];  Service: Endoscopy;  Laterality: N/A;  12:45   Family History  Problem Relation Age of Onset  . Colon cancer Neg Hx   . Crohn's disease Neg Hx   . Ulcerative colitis Neg Hx   . Breast cancer Cousin     2   History  Substance Use Topics  . Smoking status: Current Every Day Smoker -- 1.00 packs/day  . Smokeless tobacco: Not on file  . Alcohol Use: No    Review of Systems  Constitutional: Positive for diaphoresis and fatigue. Negative for fever.  HENT: Negative.  Negative for congestion and sore throat.   Eyes: Negative.  Negative for photophobia and visual disturbance.  Respiratory: Negative for chest tightness and shortness of breath.   Cardiovascular: Negative  for chest pain, palpitations and leg swelling.  Gastrointestinal: Negative for nausea, abdominal pain and abdominal distention.  Genitourinary: Negative.   Musculoskeletal: Negative for arthralgias, joint swelling and neck pain.  Skin: Negative.  Negative for rash and wound.  Neurological: Positive for weakness and light-headedness. Negative for dizziness, numbness and headaches.  Psychiatric/Behavioral: Negative.       Allergies  Review of patient's allergies indicates no known allergies.  Home Medications   Prior to Admission medications   Medication Sig Start Date End Date Taking? Authorizing Provider  allopurinol (ZYLOPRIM) 300 MG tablet Take 300 mg by mouth at bedtime.    Yes Historical Provider, MD  aspirin 325 MG tablet Take 325 mg by mouth at  bedtime.    Yes Historical Provider, MD  atorvastatin (LIPITOR) 10 MG tablet Take 1 tablet by mouth at bedtime.  01/05/14  Yes Historical Provider, MD  diltiazem (CARDIZEM CD) 180 MG 24 hr capsule Take 180 mg by mouth at bedtime.    Yes Historical Provider, MD  folic acid (FOLVITE) 1 MG tablet Take 1 mg by mouth at bedtime.    Yes Historical Provider, MD  HYDROcodone-acetaminophen (NORCO) 7.5-325 MG per tablet 1 tablet every 6 (six) hours as needed for pain.  07/05/13  Yes Historical Provider, MD  metoprolol succinate (TOPROL-XL) 50 MG 24 hr tablet Take 50 mg by mouth at bedtime. Take with or immediately following a meal.   Yes Historical Provider, MD  nitroGLYCERIN (NITROSTAT) 0.4 MG SL tablet Place 0.4 mg under the tongue every 5 (five) minutes as needed.    Yes Historical Provider, MD  pantoprazole (PROTONIX) 40 MG tablet Take 40 mg by mouth at bedtime.    Yes Historical Provider, MD  potassium chloride SA (K-DUR,KLOR-CON) 20 MEQ tablet Take 40 mEq by mouth at bedtime.    Yes Historical Provider, MD  ramipril (ALTACE) 10 MG capsule Take 10 mg by mouth at bedtime. No further refills without an appointment. 02/03/14  Yes Lorretta Harp, MD   BP 128/74  Pulse 82  Temp(Src) 98.2 F (36.8 C) (Oral)  Resp 16  Ht 5\' 9"  (1.753 m)  Wt 219 lb (99.338 kg)  BMI 32.33 kg/m2  SpO2 98% Physical Exam  Nursing note and vitals reviewed. Constitutional: He is oriented to person, place, and time. He appears well-developed and well-nourished.  HENT:  Head: Normocephalic and atraumatic.  Eyes: Conjunctivae are normal.  Neck: Normal range of motion.  Cardiovascular: Normal rate, regular rhythm, normal heart sounds and intact distal pulses.   Pulmonary/Chest: Effort normal and breath sounds normal. No respiratory distress. He has no wheezes. He exhibits no tenderness.  Abdominal: Soft. Bowel sounds are normal. He exhibits no distension and no mass. There is no tenderness. There is no guarding.  Musculoskeletal:  Normal range of motion.  Neurological: He is alert and oriented to person, place, and time.  Skin: Skin is warm and dry.  Psychiatric: He has a normal mood and affect.    ED Course  Procedures (including critical care time) Labs Review Labs Reviewed  CBC - Abnormal; Notable for the following:    MCHC 37.1 (*)    Platelets 121 (*)    All other components within normal limits  BASIC METABOLIC PANEL - Abnormal; Notable for the following:    Glucose, Bld 112 (*)    GFR calc non Af Amer 69 (*)    GFR calc Af Amer 80 (*)    All other components within normal limits  TROPONIN  I  TROPONIN I    Imaging Review Dg Chest Portable 1 View  03/17/2014   CLINICAL DATA:  Chest pain.  History of COPD and smoker.  EXAM: PORTABLE CHEST - 1 VIEW  COMPARISON:  DG THORACIC SPINE dated 10/15/2009; DG CHEST 1V PORT dated 06/21/2005  FINDINGS: The heart size and mediastinal contours are within normal limits. Both lungs are clear. The visualized skeletal structures are unremarkable.  IMPRESSION: No active disease.   Electronically Signed   By: Lucienne Capers M.D.   On: 03/17/2014 22:07     EKG Interpretation None      Date: 03/17/2014  Rate: 93  Rhythm: normal sinus rhythm  QRS Axis: normal  Intervals: normal  ST/T Wave abnormalities: normal  Conduction Disutrbances:first-degree A-V block   Narrative Interpretation:   Old EKG Reviewed: none available    MDM   Final diagnoses:  Near syncope    Patients labs and/or radiological studies were viewed and considered during the medical decision making and disposition process. Pt was asymptomatic at time of dc,  No symptoms when orthostatic VS were obtained.  Pt had negative troponin x 2, ekg stable,  Unable to locate past ekg - unsure if 1st degree block is chronic, but still not concerning for source of sx.  Discussed f/u with his cardiologist who pt he is due for a yearly check. Pt agrees and will f/u by calling for appt with Dr. Gwenlyn Found.  Doubt sx  today are of cardiac source. No abnormal cardiac rhythms while on monitor during ed visit.  Encouraged to avoid skipping meals, sx possibly from drop in blood glucose.    The patient appears reasonably screened and/or stabilized for discharge and I doubt any other medical condition or other [facility] requiring further screening, evaluation, or treatment in the ED at this time prior to discharge.     Evalee Jefferson, PA-C 03/18/14 0140

## 2014-03-20 NOTE — ED Provider Notes (Signed)
Medical screening examination/treatment/procedure(s) were performed by non-physician practitioner and as supervising physician I was immediately available for consultation/collaboration.   EKG Interpretation   Date/Time:  Friday March 17 2014 21:54:26 EDT Ventricular Rate:  93 PR Interval:  222 QRS Duration: 92 QT Interval:  360 QTC Calculation: 447 R Axis:   14 Text Interpretation:  Sinus rhythm with 1st degree A-V block Otherwise  normal ECG When compared with ECG of 22-Jun-2005 05:30, Minimal criteria  for Inferior infarct are no longer Present ED PHYSICIAN INTERPRETATION  AVAILABLE IN [facility] Confirmed by TEST, Record ([id number]) on  03/19/2014 1:21:22 PM        Maudry Diego, MD 03/20/14 1422

## 2014-05-09 ENCOUNTER — Ambulatory Visit: Payer: BC Managed Care – PPO | Admitting: Cardiovascular Disease

## 2014-05-14 ENCOUNTER — Emergency Department (HOSPITAL_COMMUNITY): Payer: BC Managed Care – PPO

## 2014-05-14 ENCOUNTER — Encounter (HOSPITAL_COMMUNITY): Payer: Self-pay | Admitting: Emergency Medicine

## 2014-05-14 ENCOUNTER — Emergency Department (HOSPITAL_COMMUNITY)
Admission: EM | Admit: 2014-05-14 | Discharge: 2014-05-14 | Disposition: A | Discharge status: Home / Self Care | Payer: BC Managed Care – PPO | Attending: Emergency Medicine | Admitting: Emergency Medicine

## 2014-05-14 DIAGNOSIS — Z8669 Personal history of other diseases of the nervous system and sense organs: Secondary | ICD-10-CM | POA: Diagnosis not present

## 2014-05-14 DIAGNOSIS — J449 Chronic obstructive pulmonary disease, unspecified: Secondary | ICD-10-CM | POA: Diagnosis not present

## 2014-05-14 DIAGNOSIS — G4730 Sleep apnea, unspecified: Secondary | ICD-10-CM | POA: Insufficient documentation

## 2014-05-14 DIAGNOSIS — Z8719 Personal history of other diseases of the digestive system: Secondary | ICD-10-CM | POA: Diagnosis not present

## 2014-05-14 DIAGNOSIS — R300 Dysuria: Secondary | ICD-10-CM | POA: Diagnosis not present

## 2014-05-14 DIAGNOSIS — Z9861 Coronary angioplasty status: Secondary | ICD-10-CM | POA: Diagnosis not present

## 2014-05-14 DIAGNOSIS — M109 Gout, unspecified: Secondary | ICD-10-CM | POA: Diagnosis not present

## 2014-05-14 DIAGNOSIS — Z8659 Personal history of other mental and behavioral disorders: Secondary | ICD-10-CM | POA: Insufficient documentation

## 2014-05-14 DIAGNOSIS — J4489 Other specified chronic obstructive pulmonary disease: Secondary | ICD-10-CM | POA: Diagnosis not present

## 2014-05-14 DIAGNOSIS — I2510 Atherosclerotic heart disease of native coronary artery without angina pectoris: Secondary | ICD-10-CM | POA: Insufficient documentation

## 2014-05-14 DIAGNOSIS — F17200 Nicotine dependence, unspecified, uncomplicated: Secondary | ICD-10-CM | POA: Diagnosis not present

## 2014-05-14 DIAGNOSIS — Z8547 Personal history of malignant neoplasm of testis: Secondary | ICD-10-CM | POA: Diagnosis not present

## 2014-05-14 DIAGNOSIS — Z7982 Long term (current) use of aspirin: Secondary | ICD-10-CM | POA: Diagnosis not present

## 2014-05-14 DIAGNOSIS — IMO0002 Reserved for concepts with insufficient information to code with codable children: Secondary | ICD-10-CM | POA: Insufficient documentation

## 2014-05-14 DIAGNOSIS — Z79899 Other long term (current) drug therapy: Secondary | ICD-10-CM | POA: Insufficient documentation

## 2014-05-14 DIAGNOSIS — M5416 Radiculopathy, lumbar region: Secondary | ICD-10-CM

## 2014-05-14 DIAGNOSIS — Z9221 Personal history of antineoplastic chemotherapy: Secondary | ICD-10-CM | POA: Diagnosis not present

## 2014-05-14 DIAGNOSIS — M545 Low back pain, unspecified: Secondary | ICD-10-CM | POA: Diagnosis present

## 2014-05-14 HISTORY — DX: Major depressive disorder, single episode, unspecified: F32.9

## 2014-05-14 HISTORY — DX: Depression, unspecified: F32.A

## 2014-05-14 LAB — CBC WITH DIFFERENTIAL/PLATELET
BASOS ABS: 0 10*3/uL (ref 0.0–0.1)
Basophils Relative: 1 % (ref 0–1)
Eosinophils Absolute: 0.1 10*3/uL (ref 0.0–0.7)
Eosinophils Relative: 2 % (ref 0–5)
HEMATOCRIT: 44.1 % (ref 39.0–52.0)
Hemoglobin: 16.2 g/dL (ref 13.0–17.0)
LYMPHS PCT: 40 % (ref 12–46)
Lymphs Abs: 2.3 10*3/uL (ref 0.7–4.0)
MCH: 31.5 pg (ref 26.0–34.0)
MCHC: 36.7 g/dL — ABNORMAL HIGH (ref 30.0–36.0)
MCV: 85.8 fL (ref 78.0–100.0)
Monocytes Absolute: 0.5 10*3/uL (ref 0.1–1.0)
Monocytes Relative: 9 % (ref 3–12)
NEUTROS ABS: 2.8 10*3/uL (ref 1.7–7.7)
Neutrophils Relative %: 49 % (ref 43–77)
PLATELETS: 126 10*3/uL — AB (ref 150–400)
RBC: 5.14 MIL/uL (ref 4.22–5.81)
RDW: 13.7 % (ref 11.5–15.5)
WBC: 6 10*3/uL (ref 4.0–10.5)

## 2014-05-14 LAB — URINALYSIS, ROUTINE W REFLEX MICROSCOPIC
Bilirubin Urine: NEGATIVE
Glucose, UA: NEGATIVE mg/dL
Hgb urine dipstick: NEGATIVE
Ketones, ur: NEGATIVE mg/dL
Leukocytes, UA: NEGATIVE
Nitrite: NEGATIVE
Protein, ur: NEGATIVE mg/dL
Specific Gravity, Urine: >1.03 — ABNORMAL HIGH (ref 1.005–1.030)
Urobilinogen, UA: 2 mg/dL — ABNORMAL HIGH (ref 0.0–1.0)
pH: 6 (ref 5.0–8.0)

## 2014-05-14 LAB — COMPREHENSIVE METABOLIC PANEL
Albumin: 4.2 g/dL (ref 3.5–5.2)
Alkaline Phosphatase: 110 U/L (ref 39–117)
BILIRUBIN TOTAL: 0.6 mg/dL (ref 0.3–1.2)
BUN: 12 mg/dL (ref 6–23)
CHLORIDE: 104 meq/L (ref 96–112)
CO2: 23 meq/L (ref 19–32)
Calcium: 8.8 mg/dL (ref 8.4–10.5)
Creatinine, Ser: 1.02 mg/dL (ref 0.50–1.35)
GFR calc Af Amer: >90 mL/min (ref >90)
GFR, EST NON AFRICAN AMERICAN: 79 mL/min — AB (ref >90)
Glucose, Bld: 98 mg/dL (ref 70–99)
Potassium: 3.7 mEq/L (ref 3.7–5.3)
SODIUM: 141 meq/L (ref 137–147)
Total Protein: 7.1 g/dL (ref 6.0–8.3)

## 2014-05-14 MED ORDER — PREDNISONE 10 MG PO TABS: ORAL_TABLET | ORAL | Status: DC

## 2014-05-14 MED ORDER — OXYCODONE-ACETAMINOPHEN 5-325 MG PO TABS: 1.0000 | ORAL_TABLET | ORAL | Status: DC | PRN

## 2014-05-14 MED ORDER — ONDANSETRON HCL 4 MG/2ML IJ SOLN
4.0000 mg | Freq: Once | INTRAMUSCULAR | Status: AC
  Administered 2014-05-14: 4 mg via INTRAVENOUS
  Filled 2014-05-14: qty 2

## 2014-05-14 MED ORDER — METHOCARBAMOL 500 MG PO TABS: 500.0000 mg | ORAL_TABLET | Freq: Three times a day (TID) | ORAL | Status: DC

## 2014-05-14 MED ORDER — KETOROLAC TROMETHAMINE 30 MG/ML IJ SOLN
30.0000 mg | Freq: Once | INTRAMUSCULAR | Status: AC
  Administered 2014-05-14: 30 mg via INTRAVENOUS
  Filled 2014-05-14: qty 1

## 2014-05-14 MED ORDER — SODIUM CHLORIDE 0.9 % IV SOLN
Freq: Once | INTRAVENOUS | Status: AC
  Administered 2014-05-14: 12:00:00 via INTRAVENOUS

## 2014-05-14 NOTE — ED Notes (Signed)
Patient c/o left lower back pain that radiates into left lower abd and down left leg. Patient reports chills. Patient also reports nausea and feeling lightheaded. Patient states "This has being going on for a whil but this morning it was really bad." Reports frequency in urination with little urine.

## 2014-05-14 NOTE — Discharge Instructions (Signed)
Back Pain, Adult  Back pain is very common. The pain often gets better over time. The cause of back pain is usually not dangerous. Most people can learn to manage their back pain on their own.   HOME CARE   · Stay active. Start with short walks on flat ground if you can. Try to walk farther each day.  · Do not sit, drive, or stand in one place for more than 30 minutes. Do not stay in bed.  · Do not avoid exercise or work. Activity can help your back heal faster.  · Be careful when you bend or lift an object. Bend at your knees, keep the object close to you, and do not twist.  · Sleep on a firm mattress. Lie on your side, and bend your knees. If you lie on your back, put a pillow under your knees.  · Only take medicines as told by your doctor.  · Put ice on the injured area.  · Put ice in a plastic bag.  · Place a towel between your skin and the bag.  · Leave the ice on for 15-20 minutes, 03-04 times a day for the first 2 to 3 days. After that, you can switch between ice and heat packs.  · Ask your doctor about back exercises or massage.  · Avoid feeling anxious or stressed. Find good ways to deal with stress, such as exercise.  GET HELP RIGHT AWAY IF:   · Your pain does not go away with rest or medicine.  · Your pain does not go away in 1 week.  · You have new problems.  · You do not feel well.  · The pain spreads into your legs.  · You cannot control when you poop (bowel movement) or pee (urinate).  · Your arms or legs feel weak or lose feeling (numbness).  · You feel sick to your stomach (nauseous) or throw up (vomit).  · You have belly (abdominal) pain.  · You feel like you may pass out (faint).  MAKE SURE YOU:   · Understand these instructions.  · Will watch your condition.  · Will get help right away if you are not doing well or get worse.  Document Released: 05/05/2008 Document Revised: 02/09/2012 Document Reviewed: 04/07/2011  [employer]® Patient Information ©2014 [employer].

## 2014-05-17 NOTE — ED Provider Notes (Signed)
CSN: [id number]     Arrival date & time 05/14/14  1113 History   First MD Initiated Contact with Patient 05/14/14 1128     Chief Complaint  Patient presents with  . Back Pain     (Consider location/radiation/quality/duration/timing/severity/associated sxs/prior Treatment) Patient is a 58 y.o. male presenting with back pain. The history is provided by the patient.  Back Pain Location:  Lumbar spine Quality:  Aching and shooting Radiates to:  L thigh, L knee and L posterior upper leg Pain severity:  Moderate Pain is:  Same all the time Onset quality:  Gradual Duration:  3 weeks Timing:  Constant Progression:  Worsening Chronicity:  Chronic Context: not falling, not recent illness and not recent injury   Relieved by:  Bed rest and lying down Worsened by:  Bending, ambulation, twisting and standing Ineffective treatments:  Narcotics Associated symptoms: dysuria and leg pain   Associated symptoms: no abdominal pain, no abdominal swelling, no bladder incontinence, no bowel incontinence, no chest pain, no fever, no headaches, no numbness, no paresthesias, no pelvic pain, no perianal numbness, no tingling and no weakness   Dysuria:    Severity:  Mild   Onset quality:  Gradual   Timing:  Intermittent   Progression:  Waxing and waning   Chronicity:  New Risk factors: hx of cancer   Risk factors comment:  Hx of testicular CA age 72's, had testicle removed.  currently in remission.   Past Medical History  Diagnosis Date  . Heart disease   . Arthritis   . SOB (shortness of breath)   . Cancer     testicular surgery, s/p resection and chemotherapy, age early 8s  . Nerve damage   . Eye pain   . Chills   . Blurred vision   . Blackout     vision problem, not syncope  . Gout   . CAD (coronary artery disease)   . COPD (chronic obstructive pulmonary disease)   . Sleep apnea     CPAP  . IBS (irritable bowel syndrome)     diagnosed in his 24s  . Depression    Past Surgical  History  Procedure Laterality Date  . Cholecystectomy    . Right foot    . Coronary angioplasty with stent placement  2004    3 total stents  . Surgery scrotal / testicular      right testicular cancer  . Colonoscopy with esophagogastroduodenoscopy (egd) N/A 07/29/2013    Procedure: COLONOSCOPY WITH ESOPHAGOGASTRODUODENOSCOPY (EGD);  Surgeon: Daneil Dolin, MD;  Location: [facility];  Service: Endoscopy;  Laterality: N/A;  12:45   Family History  Problem Relation Age of Onset  . Colon cancer Neg Hx   . Crohn's disease Neg Hx   . Ulcerative colitis Neg Hx   . Breast cancer Cousin     2  . Kidney failure Brother    History  Substance Use Topics  . Smoking status: Current Every Day Smoker -- 1.00 packs/day for 30 years    Types: Cigarettes  . Smokeless tobacco: Never Used  . Alcohol Use: No    Review of Systems  Constitutional: Negative for fever.  Respiratory: Negative for shortness of breath.   Cardiovascular: Negative for chest pain.  Gastrointestinal: Negative for nausea, vomiting, abdominal pain, constipation, abdominal distention and bowel incontinence.  Genitourinary: Positive for dysuria. Negative for bladder incontinence, hematuria, flank pain, decreased urine volume, difficulty urinating and pelvic pain.       No perineal numbness  or incontinence of urine or feces  Musculoskeletal: Positive for back pain. Negative for joint swelling.  Skin: Negative for rash.  Neurological: Negative for dizziness, tingling, syncope, speech difficulty, weakness, numbness, headaches and paresthesias.  All other systems reviewed and are negative.     Allergies  Review of patient's allergies indicates no known allergies.  Home Medications   Prior to Admission medications   Medication Sig Start Date End Date Taking? Authorizing Provider  allopurinol (ZYLOPRIM) 300 MG tablet Take 300 mg by mouth at bedtime.    Yes Historical Provider, MD  aspirin 325 MG tablet Take 325 mg by  mouth at bedtime.    Yes Historical Provider, MD  atorvastatin (LIPITOR) 10 MG tablet Take 1 tablet by mouth at bedtime.  01/05/14  Yes Historical Provider, MD  diltiazem (CARDIZEM CD) 180 MG 24 hr capsule Take 180 mg by mouth at bedtime.    Yes Historical Provider, MD  folic acid (FOLVITE) 1 MG tablet Take 1 mg by mouth at bedtime.    Yes Historical Provider, MD  HYDROcodone-acetaminophen (NORCO) 7.5-325 MG per tablet 1 tablet every 6 (six) hours as needed for pain.  07/05/13  Yes Historical Provider, MD  metoprolol succinate (TOPROL-XL) 50 MG 24 hr tablet Take 50 mg by mouth at bedtime. Take with or immediately following a meal.   Yes Historical Provider, MD  pantoprazole (PROTONIX) 40 MG tablet Take 40 mg by mouth at bedtime.    Yes Historical Provider, MD  potassium chloride SA (K-DUR,KLOR-CON) 20 MEQ tablet Take 20 mEq by mouth at bedtime.    Yes Historical Provider, MD  ramipril (ALTACE) 10 MG capsule Take 10 mg by mouth at bedtime. No further refills without an appointment. 02/03/14  Yes Lorretta Harp, MD  methocarbamol (ROBAXIN) 500 MG tablet Take 1 tablet (500 mg total) by mouth 3 (three) times daily. 05/14/14   Tammy L. Triplett, PA-C  nitroGLYCERIN (NITROSTAT) 0.4 MG SL tablet Place 0.4 mg under the tongue every 5 (five) minutes as needed.     Historical Provider, MD  oxyCODONE-acetaminophen (PERCOCET/ROXICET) 5-325 MG per tablet Take 1 tablet by mouth every 4 (four) hours as needed for severe pain. 05/14/14   Tammy L. Triplett, PA-C  predniSONE (DELTASONE) 10 MG tablet Take 6 tablets day one, 5 tablets day two, 4 tablets day three, 3 tablets day four, 2 tablets day five, then 1 tablet day six 05/14/14   Tammy L. Triplett, PA-C   BP 121/58  Pulse 55  Temp(Src) 98.3 F (36.8 C) (Oral)  Resp 20  Ht 5\' 10"  (1.778 m)  Wt 220 lb (99.791 kg)  BMI 31.57 kg/m2  SpO2 100% Physical Exam  Nursing note and vitals reviewed. Constitutional: He is oriented to person, place, and time. He appears  well-developed and well-nourished. No distress.  HENT:  Head: Normocephalic and atraumatic.  Neck: Normal range of motion. Neck supple.  Cardiovascular: Normal rate, regular rhythm, normal heart sounds and intact distal pulses.   No murmur heard. Pulmonary/Chest: Effort normal and breath sounds normal. No respiratory distress. He exhibits no tenderness.  Abdominal: Soft. Normal appearance. He exhibits no distension and no mass. There is no tenderness. There is no rebound, no guarding and no CVA tenderness.  Musculoskeletal: He exhibits tenderness. He exhibits no edema.       Lumbar back: He exhibits tenderness and pain. He exhibits normal range of motion, no swelling, no deformity, no laceration and normal pulse.  ttp of the left lumbar spine and paraspinal  muscles. Describes pain as radiating to left groin and lateral left upper leg. DP pulses are brisk and symmetrical.  Distal sensation intact.  Hip Flexors/Extensors are intact.  Pt has 5/5 strength against resistance of bilateral lower extremities.     Neurological: He is alert and oriented to person, place, and time. He has normal strength. No sensory deficit. He exhibits normal muscle tone. Coordination and gait normal.  Reflex Scores:      Patellar reflexes are 2+ on the right side and 2+ on the left side.      Achilles reflexes are 2+ on the right side and 2+ on the left side. Skin: Skin is warm and dry. No rash noted.    ED Course  Procedures (including critical care time) Labs Review Labs Reviewed  URINALYSIS, ROUTINE W REFLEX MICROSCOPIC - Abnormal; Notable for the following:    Specific Gravity, Urine >1.030 (*)    Urobilinogen, UA 2.0 (*)    All other components within normal limits  CBC WITH DIFFERENTIAL - Abnormal; Notable for the following:    MCHC 36.7 (*)    Platelets 126 (*)    All other components within normal limits  COMPREHENSIVE METABOLIC PANEL - Abnormal; Notable for the following:    GFR calc non Af Amer 79  (*)    All other components within normal limits    Imaging Review Dg Lumbar Spine Complete  05/14/2014   CLINICAL DATA:  Back pain.  Flank pain.  EXAM: LUMBAR SPINE - COMPLETE 4+ VIEW  COMPARISON:  01/19/2014.  FINDINGS: Paraspinal soft tissues are unremarkable. No acute bony abnormality identified. Diffuse degenerative changes lumbar spine. Aortoiliac atherosclerotic vascular disease. Surgical clips right upper quadrant.  IMPRESSION: Diffuse degenerative changes lumbar spine, no acute abnormality.   Electronically Signed   By: Marcello Moores  Register   On: 05/14/2014 13:17    EKG Interpretation None      MDM   Final diagnoses:  Lumbar radicular pain    Patient is well appearing, ambulates with steady gait.  No focal neuro deficits.  Patient reports that symptoms are chronic.  Abdomen is soft, NT on exam, no guarding or peritoneal signs.  Labs and imaging are reassuring.  Low suspicion for emergent neurological or infectious process.  Pt feeling better after medication, he was advised of importance of close f/u with PMD this week or to return here if sx's are worsening or not improving.  Pt verbalized understanding and agrees to plan.  Appears stable for d/c   Tammy L. Vanessa Belhaven, PA-C 05/17/14 1855

## 2014-05-18 NOTE — ED Provider Notes (Signed)
Medical screening examination/treatment/procedure(s) were performed by non-physician practitioner and as supervising physician I was immediately available for consultation/collaboration.   EKG Interpretation None       Virgel Manifold, MD 05/18/14 [phone]

## 2014-06-20 ENCOUNTER — Ambulatory Visit (INDEPENDENT_AMBULATORY_CARE_PROVIDER_SITE_OTHER): Payer: BC Managed Care – PPO | Admitting: Cardiovascular Disease

## 2014-06-20 ENCOUNTER — Encounter: Payer: Self-pay | Admitting: Cardiovascular Disease

## 2014-06-20 VITALS — BP 118/80 | HR 73 | Ht 69.0 in | Wt 217.0 lb

## 2014-06-20 DIAGNOSIS — I2584 Coronary atherosclerosis due to calcified coronary lesion: Secondary | ICD-10-CM

## 2014-06-20 DIAGNOSIS — I2510 Atherosclerotic heart disease of native coronary artery without angina pectoris: Secondary | ICD-10-CM | POA: Diagnosis not present

## 2014-06-20 DIAGNOSIS — F17200 Nicotine dependence, unspecified, uncomplicated: Secondary | ICD-10-CM

## 2014-06-20 DIAGNOSIS — I10 Essential (primary) hypertension: Secondary | ICD-10-CM | POA: Diagnosis not present

## 2014-06-20 DIAGNOSIS — Z720 Tobacco use: Secondary | ICD-10-CM | POA: Insufficient documentation

## 2014-06-20 DIAGNOSIS — E785 Hyperlipidemia, unspecified: Secondary | ICD-10-CM | POA: Diagnosis not present

## 2014-06-20 NOTE — Patient Instructions (Signed)
Your physician wants you to follow-up in: 1 year with Dr Berry. You will receive a reminder letter in the mail two months in advance. If you don't receive a letter, please call our office to schedule the follow-up appointment.  

## 2014-06-20 NOTE — Assessment & Plan Note (Signed)
Controlled on current medications 

## 2014-06-20 NOTE — Progress Notes (Signed)
06/20/2014 Daniel Fields   03/27/1956  [id number]  Primary Physician Alonza Bogus, MD Primary Cardiologist: Lorretta Harp MD Renae Gloss   HPI:  The patient is a 58 year old mildly-overweight married Caucasian male, father of 2, whom I last saw in the office 12/24/12 months ago. He had PCI stenting of his LAD and circumflex back in 2004 by Dr. Christy Sartorius. Catheterized on March 08, 2008, because of chest burning that demonstrated patent stents but otherwise noncritical CAD. He does have GERD. A Myoview performed November 2011 showed subtle ischemia anteroapically felt to be low risk. He had claudication-type symptoms and had normal Dopplers and ABIs, his symptoms then thought to be related to arthritis and/or statin myopathy.saw him 12/24/12 he had a presyncopal episode and was evaluated in the emergency room but denies chest pain or shortness of breath.  Current Outpatient Prescriptions  Medication Sig Dispense Refill  . allopurinol (ZYLOPRIM) 300 MG tablet Take 300 mg by mouth at bedtime.       Marland Kitchen aspirin 325 MG tablet Take 325 mg by mouth at bedtime.       Marland Kitchen atorvastatin (LIPITOR) 10 MG tablet Take 1 tablet by mouth at bedtime.       Marland Kitchen diltiazem (CARDIZEM CD) 180 MG 24 hr capsule Take 180 mg by mouth at bedtime.       . folic acid (FOLVITE) 1 MG tablet Take 1 mg by mouth at bedtime.       Marland Kitchen HYDROcodone-acetaminophen (NORCO) 7.5-325 MG per tablet 1 tablet every 6 (six) hours as needed for pain.       . methocarbamol (ROBAXIN) 500 MG tablet Take 1 tablet (500 mg total) by mouth 3 (three) times daily.  21 tablet  0  . metoprolol succinate (TOPROL-XL) 50 MG 24 hr tablet Take 50 mg by mouth at bedtime. Take with or immediately following a meal.      . nitroGLYCERIN (NITROSTAT) 0.4 MG SL tablet Place 0.4 mg under the tongue every 5 (five) minutes as needed.       . pantoprazole (PROTONIX) 40 MG tablet Take 40 mg by mouth at bedtime.       . potassium chloride SA  (K-DUR,KLOR-CON) 20 MEQ tablet Take 20 mEq by mouth at bedtime.       . predniSONE (DELTASONE) 10 MG tablet Take 6 tablets day one, 5 tablets day two, 4 tablets day three, 3 tablets day four, 2 tablets day five, then 1 tablet day six  21 tablet  0  . ramipril (ALTACE) 10 MG capsule Take 10 mg by mouth at bedtime. No further refills without an appointment.       No current facility-administered medications for this visit.    No Known Allergies  History   Social History  . Marital Status: Married    Spouse Name: N/A    Number of Children: N/A  . Years of Education: N/A   Occupational History  . Not on file.   Social History Main Topics  . Smoking status: Current Every Day Smoker -- 1.00 packs/day for 30 years    Types: Cigarettes  . Smokeless tobacco: Never Used  . Alcohol Use: No  . Drug Use: No  . Sexual Activity: Not on file   Other Topics Concern  . Not on file   Social History Narrative  . No narrative on file     Review of Systems: General: negative for chills, fever, night sweats or weight changes.  Cardiovascular: negative  for chest pain, dyspnea on exertion, edema, orthopnea, palpitations, paroxysmal nocturnal dyspnea or shortness of breath Dermatological: negative for rash Respiratory: negative for cough or wheezing Urologic: negative for hematuria Abdominal: negative for nausea, vomiting, diarrhea, bright red blood per rectum, melena, or hematemesis Neurologic: negative for visual changes, syncope, or dizziness All other systems reviewed and are otherwise negative except as noted above.    Blood pressure 118/80, pulse 73, height 5\' 9"  (1.753 m), weight 217 lb (98.431 kg).  General appearance: alert and no distress Neck: no adenopathy, no carotid bruit, no JVD, supple, symmetrical, trachea midline and thyroid not enlarged, symmetric, no tenderness/mass/nodules Lungs: clear to auscultation bilaterally Heart: regular rate and rhythm, S1, S2 normal, no murmur,  click, rub or gallop Extremities: extremities normal, atraumatic, no cyanosis or edema  EKG normal sinus rhythm at 73 with anteroseptal myocardial infarction  ASSESSMENT AND PLAN:   Coronary artery disease History of CAD status post PCI stenting of his LAD and circumflex coronary arteries in 2004 by Dr. Lyndel Safe. He was recatheterized 4/8/9 revealing patent stents and normal LV function. His last Myoview performed 05/25/12 was nonischemic. He denies chest pain or shortness of breath.  Essential hypertension Controlled on current medications  Hyperlipidemia On statin therapy followed by his PCP      Lorretta Harp MD [facility], [facility] 06/20/2014 1:56 PM

## 2014-06-20 NOTE — Assessment & Plan Note (Signed)
History of CAD status post PCI stenting of his LAD and circumflex coronary arteries in 2004 by Dr. Lyndel Safe. He was recatheterized 4/8/9 revealing patent stents and normal LV function. His last Myoview performed 05/25/12 was nonischemic. He denies chest pain or shortness of breath.

## 2014-06-20 NOTE — Assessment & Plan Note (Signed)
On statin therapy followed by his PCP 

## 2014-08-04 ENCOUNTER — Other Ambulatory Visit (HOSPITAL_COMMUNITY): Payer: Self-pay | Admitting: Pulmonary Disease

## 2014-08-04 DIAGNOSIS — M545 Low back pain, unspecified: Secondary | ICD-10-CM

## 2014-08-04 DIAGNOSIS — M5442 Lumbago with sciatica, left side: Secondary | ICD-10-CM

## 2014-08-08 ENCOUNTER — Other Ambulatory Visit (HOSPITAL_COMMUNITY): Payer: Self-pay | Admitting: Pulmonary Disease

## 2014-08-08 DIAGNOSIS — M542 Cervicalgia: Secondary | ICD-10-CM

## 2014-08-10 ENCOUNTER — Ambulatory Visit (HOSPITAL_COMMUNITY)
Admission: RE | Admit: 2014-08-10 | Discharge: 2014-08-10 | Disposition: A | Discharge status: Home / Self Care | Payer: BC Managed Care – PPO | Source: Ambulatory Visit | Attending: Pulmonary Disease | Admitting: Pulmonary Disease

## 2014-08-10 DIAGNOSIS — M5020 Other cervical disc displacement, unspecified cervical region: Secondary | ICD-10-CM | POA: Diagnosis not present

## 2014-08-10 DIAGNOSIS — M545 Low back pain, unspecified: Secondary | ICD-10-CM | POA: Insufficient documentation

## 2014-08-10 DIAGNOSIS — Z8547 Personal history of malignant neoplasm of testis: Secondary | ICD-10-CM | POA: Insufficient documentation

## 2014-08-10 DIAGNOSIS — M4802 Spinal stenosis, cervical region: Secondary | ICD-10-CM | POA: Insufficient documentation

## 2014-08-10 DIAGNOSIS — M47817 Spondylosis without myelopathy or radiculopathy, lumbosacral region: Secondary | ICD-10-CM | POA: Diagnosis not present

## 2014-08-10 DIAGNOSIS — M5442 Lumbago with sciatica, left side: Secondary | ICD-10-CM

## 2014-08-10 DIAGNOSIS — M5137 Other intervertebral disc degeneration, lumbosacral region: Secondary | ICD-10-CM | POA: Insufficient documentation

## 2014-08-10 DIAGNOSIS — M542 Cervicalgia: Secondary | ICD-10-CM | POA: Diagnosis not present

## 2014-08-10 DIAGNOSIS — M79609 Pain in unspecified limb: Secondary | ICD-10-CM | POA: Insufficient documentation

## 2014-08-10 DIAGNOSIS — M51379 Other intervertebral disc degeneration, lumbosacral region without mention of lumbar back pain or lower extremity pain: Secondary | ICD-10-CM | POA: Insufficient documentation

## 2014-09-04 ENCOUNTER — Other Ambulatory Visit: Payer: Self-pay | Admitting: Cardiovascular Disease

## 2014-09-04 NOTE — Telephone Encounter (Signed)
Rx was sent to pharmacy electronically. 

## 2015-01-22 DIAGNOSIS — I10 Essential (primary) hypertension: Secondary | ICD-10-CM | POA: Diagnosis not present

## 2015-01-22 DIAGNOSIS — J449 Chronic obstructive pulmonary disease, unspecified: Secondary | ICD-10-CM | POA: Diagnosis not present

## 2015-01-22 DIAGNOSIS — M545 Low back pain: Secondary | ICD-10-CM | POA: Diagnosis not present

## 2015-01-22 DIAGNOSIS — I1310 Hypertensive heart and chronic kidney disease without heart failure, with stage 1 through stage 4 chronic kidney disease, or unspecified chronic kidney disease: Secondary | ICD-10-CM | POA: Diagnosis not present

## 2015-01-25 DIAGNOSIS — M47816 Spondylosis without myelopathy or radiculopathy, lumbar region: Secondary | ICD-10-CM | POA: Diagnosis not present

## 2015-01-25 DIAGNOSIS — M5136 Other intervertebral disc degeneration, lumbar region: Secondary | ICD-10-CM | POA: Diagnosis not present

## 2015-01-25 DIAGNOSIS — M545 Low back pain: Secondary | ICD-10-CM | POA: Diagnosis not present

## 2015-01-25 DIAGNOSIS — M47812 Spondylosis without myelopathy or radiculopathy, cervical region: Secondary | ICD-10-CM | POA: Diagnosis not present

## 2015-03-14 DIAGNOSIS — M47812 Spondylosis without myelopathy or radiculopathy, cervical region: Secondary | ICD-10-CM | POA: Diagnosis not present

## 2015-03-14 DIAGNOSIS — M47816 Spondylosis without myelopathy or radiculopathy, lumbar region: Secondary | ICD-10-CM | POA: Diagnosis not present

## 2015-03-14 DIAGNOSIS — M545 Low back pain: Secondary | ICD-10-CM | POA: Diagnosis not present

## 2015-03-14 DIAGNOSIS — M5136 Other intervertebral disc degeneration, lumbar region: Secondary | ICD-10-CM | POA: Diagnosis not present

## 2015-04-10 DIAGNOSIS — M5136 Other intervertebral disc degeneration, lumbar region: Secondary | ICD-10-CM | POA: Diagnosis not present

## 2015-04-26 DIAGNOSIS — M5126 Other intervertebral disc displacement, lumbar region: Secondary | ICD-10-CM | POA: Diagnosis not present

## 2015-04-26 DIAGNOSIS — M47816 Spondylosis without myelopathy or radiculopathy, lumbar region: Secondary | ICD-10-CM | POA: Diagnosis not present

## 2015-04-26 DIAGNOSIS — M47812 Spondylosis without myelopathy or radiculopathy, cervical region: Secondary | ICD-10-CM | POA: Diagnosis not present

## 2015-08-01 ENCOUNTER — Other Ambulatory Visit: Payer: Self-pay | Admitting: *Deleted

## 2015-08-01 MED ORDER — METOPROLOL SUCCINATE ER 50 MG PO TB24: 50.0000 mg | ORAL_TABLET | Freq: Every day | ORAL | Status: DC

## 2015-08-09 DIAGNOSIS — M47816 Spondylosis without myelopathy or radiculopathy, lumbar region: Secondary | ICD-10-CM | POA: Diagnosis not present

## 2015-08-09 DIAGNOSIS — M5126 Other intervertebral disc displacement, lumbar region: Secondary | ICD-10-CM | POA: Diagnosis not present

## 2015-08-29 ENCOUNTER — Other Ambulatory Visit (HOSPITAL_COMMUNITY): Payer: Self-pay | Admitting: Respiratory Therapy

## 2015-08-29 DIAGNOSIS — J449 Chronic obstructive pulmonary disease, unspecified: Secondary | ICD-10-CM

## 2015-08-30 ENCOUNTER — Other Ambulatory Visit: Payer: Self-pay | Admitting: Cardiovascular Disease

## 2015-08-30 NOTE — Telephone Encounter (Signed)
Rx(s) sent to pharmacy electronically.  

## 2015-08-31 ENCOUNTER — Telehealth: Payer: Self-pay | Admitting: Acute Care

## 2015-08-31 NOTE — Telephone Encounter (Signed)
Per the referral of Dr. Luan Pulling I called to schedule Mr. Decicco for a lung screening. There was no answer. I have left my contact information requesting that he call me back.

## 2015-09-03 ENCOUNTER — Other Ambulatory Visit: Payer: Self-pay | Admitting: Acute Care

## 2015-09-03 DIAGNOSIS — F17210 Nicotine dependence, cigarettes, uncomplicated: Secondary | ICD-10-CM

## 2015-09-10 ENCOUNTER — Telehealth: Payer: Self-pay | Admitting: Acute Care

## 2015-09-10 ENCOUNTER — Encounter: Payer: Self-pay | Admitting: Acute Care

## 2015-09-10 ENCOUNTER — Ambulatory Visit (INDEPENDENT_AMBULATORY_CARE_PROVIDER_SITE_OTHER): Payer: BLUE CROSS/BLUE SHIELD | Admitting: Acute Care

## 2015-09-10 ENCOUNTER — Ambulatory Visit (INDEPENDENT_AMBULATORY_CARE_PROVIDER_SITE_OTHER)
Admission: RE | Admit: 2015-09-10 | Discharge: 2015-09-10 | Disposition: A | Discharge status: Home / Self Care | Payer: BLUE CROSS/BLUE SHIELD | Source: Ambulatory Visit | Attending: Acute Care | Admitting: Acute Care

## 2015-09-10 DIAGNOSIS — F17210 Nicotine dependence, cigarettes, uncomplicated: Secondary | ICD-10-CM | POA: Diagnosis not present

## 2015-09-10 DIAGNOSIS — J439 Emphysema, unspecified: Secondary | ICD-10-CM | POA: Diagnosis not present

## 2015-09-10 NOTE — Telephone Encounter (Signed)
I called to give Mr. Rumler his Low dose CT scan results. I explained that his scan was read as a lung rads 2, nodules with a very low likelihood of becoming a clinically active cancer due to lack of size or growth. I explained that the recommendation was for a repeat annual scan in 12 months, which we will call and schedule in Mid- September of 2017. He verbalized understanding of all of the above and had no further questions.He has my contact information if needed. I told him if there is any change in his health to call Dr. Luan Pulling office. I also told him I would fax this information to Dr. Kathaleen Grinder office.

## 2015-09-10 NOTE — Progress Notes (Signed)
Shared Decision Making Visit Lung Cancer Screening Program [id number])   Eligibility:  Age 59 y.o.  Pack Years Smoking History Calculation: 60 pack years (# packs/per year x # years smoked)  Recent History of coughing up blood  no  Unexplained weight loss? no ( >Than 15 pounds within the last 6 months )  Prior History Lung / other cancer no (Diagnosis within the last 5 years already requiring surveillance chest CT Scans).  Smoking Status Current Smoker  Former Smokers: Years since quit:NA  Quit Date:NA  Visit Components:  Discussion included one or more decision making aids. yes  Discussion included risk/benefits of screening. yes  Discussion included potential follow up diagnostic testing for abnormal scans. yes  Discussion included meaning and risk of over diagnosis. yes  Discussion included meaning and risk of False Positives. yes  Discussion included meaning of total radiation exposure. yes  Counseling Included:  Importance of adherence to annual lung cancer LDCT screening. yes  Impact of comorbidities on ability to participate in the program. yes  Ability and willingness to under diagnostic treatment. yes  Smoking Cessation Counseling:  Current Smokers:   Discussed importance of smoking cessation. yes  Information about tobacco cessation classes and interventions provided to patient. yes  Patient provided with "ticket" for LDCT Scan. yes  Symptomatic Patient. no  Counseling:NA  Diagnosis Code: Tobacco Use Z72.0  Asymptomatic Patient yes  Counseling (Intermediate counseling: > three minutes counseling) W5462  Former Smokers:   Discussed the importance of maintaining cigarette abstinence. NA; current smoker  Diagnosis Code: Personal History of Nicotine Dependence. V03.500  Information about tobacco cessation classes and interventions provided to patient. Current smoker, see above  Patient provided with "ticket" for LDCT Scan. yes  Written Order  for Lung Cancer Screening with LDCT placed in Epic. Yes (CT Chest Lung Cancer Screening Low Dose W/O CM) [id number] Z12.2-Screening of respiratory organs Z87.891-Personal history of nicotine dependence  I spent 15 minutes of face to face time with Mr. Lohmeyer discussing the risks and benefits of lung cancer screening. We viewed a power point together that addressed the above noted topics, pausing at intervals to allow for questions to be asked and answered for full understanding. We discussed that the single most powerful action that he could take to decrease his risk of developing lung cancer is to stop smoking. He said that he did quit in the past, for 2 years and was able to do this cold [place]. He is not ready to set a quit date but stated that when he is ready to quit he will call me for help. I gave him the " Be stronger than your excuses" card and told him about the [place] SMART classes we have at [facility] to help with the goal of becoming smoke free. The numbers on the back of the card have community resources available for free, and information about nicotine replacement therapy for free. I also gave him my card and contact information so that when he is ready he has a way to get in touch with me. We also discussed setting small goals, like smoking 1 less cigarette a day for a week, and trying to make small changes that decrease the number of cigarettes he smokes per day. He said that may be a more attainable goal for him at the moment. We discussed the time and location of his scan, and that I would call him within 48 hours with his results. He verbalized understanding of all of the above and  had no additional questions upon leaving the office. I also gave him a copy of the power point we viewed together to refer to in the future.   Magdalen Spatz, NP

## 2015-09-19 DIAGNOSIS — M47816 Spondylosis without myelopathy or radiculopathy, lumbar region: Secondary | ICD-10-CM | POA: Diagnosis not present

## 2015-09-19 DIAGNOSIS — M5126 Other intervertebral disc displacement, lumbar region: Secondary | ICD-10-CM | POA: Diagnosis not present

## 2015-09-25 ENCOUNTER — Other Ambulatory Visit: Payer: Self-pay | Admitting: Cardiovascular Disease

## 2015-10-03 ENCOUNTER — Ambulatory Visit (HOSPITAL_COMMUNITY)
Admission: RE | Admit: 2015-10-03 | Discharge: 2015-10-03 | Disposition: A | Discharge status: Home / Self Care | Payer: BLUE CROSS/BLUE SHIELD | Source: Ambulatory Visit | Attending: Pulmonary Disease | Admitting: Pulmonary Disease

## 2015-10-03 DIAGNOSIS — R0600 Dyspnea, unspecified: Secondary | ICD-10-CM | POA: Diagnosis present

## 2015-10-03 DIAGNOSIS — J449 Chronic obstructive pulmonary disease, unspecified: Secondary | ICD-10-CM | POA: Insufficient documentation

## 2015-10-03 DIAGNOSIS — F17210 Nicotine dependence, cigarettes, uncomplicated: Secondary | ICD-10-CM | POA: Insufficient documentation

## 2015-10-03 LAB — PULMONARY FUNCTION TEST
DL/VA % pred: 104 %
DL/VA: 4.86 ml/min/mmHg/L
DLCO unc % pred: 67 %
DLCO unc: 21.77 ml/min/mmHg
FEF 25-75 POST: 2.53 L/s
FEF 25-75 PRE: 2.73 L/s
FEF2575-%Change-Post: -7 %
FEF2575-%PRED-POST: 84 %
FEF2575-%Pred-Pre: 90 %
FEV1-%Change-Post: -1 %
FEV1-%PRED-PRE: 78 %
FEV1-%Pred-Post: 77 %
FEV1-Post: 2.8 L
FEV1-Pre: 2.85 L
FEV1FVC-%Change-Post: 7 %
FEV1FVC-%PRED-PRE: 104 %
FEV6-%Change-Post: -8 %
FEV6-%PRED-POST: 71 %
FEV6-%PRED-PRE: 78 %
FEV6-Post: 3.28 L
FEV6-Pre: 3.59 L
FEV6FVC-%Pred-Post: 105 %
FEV6FVC-%Pred-Pre: 105 %
FVC-%Change-Post: -8 %
FVC-%PRED-POST: 68 %
FVC-%PRED-PRE: 74 %
FVC-POST: 3.28 L
FVC-Pre: 3.59 L
PRE FEV1/FVC RATIO: 80 %
PRE FEV6/FVC RATIO: 100 %
Post FEV1/FVC ratio: 85 %
Post FEV6/FVC ratio: 100 %
RV % PRED: 139 %
RV: 3.12 L
TLC % PRED: 93 %
TLC: 6.57 L

## 2015-10-03 MED ORDER — ALBUTEROL SULFATE (2.5 MG/3ML) 0.083% IN NEBU
2.5000 mg | INHALATION_SOLUTION | Freq: Once | RESPIRATORY_TRACT | Status: AC
  Administered 2015-10-03: 2.5 mg via RESPIRATORY_TRACT

## 2015-10-04 DIAGNOSIS — M47816 Spondylosis without myelopathy or radiculopathy, lumbar region: Secondary | ICD-10-CM | POA: Diagnosis not present

## 2015-10-04 DIAGNOSIS — M5126 Other intervertebral disc displacement, lumbar region: Secondary | ICD-10-CM | POA: Diagnosis not present

## 2015-10-22 DIAGNOSIS — M5126 Other intervertebral disc displacement, lumbar region: Secondary | ICD-10-CM | POA: Diagnosis not present

## 2015-10-22 DIAGNOSIS — Z8547 Personal history of malignant neoplasm of testis: Secondary | ICD-10-CM | POA: Diagnosis not present

## 2015-10-22 DIAGNOSIS — I10 Essential (primary) hypertension: Secondary | ICD-10-CM | POA: Diagnosis not present

## 2015-10-22 DIAGNOSIS — G4730 Sleep apnea, unspecified: Secondary | ICD-10-CM | POA: Diagnosis not present

## 2015-10-22 DIAGNOSIS — F17200 Nicotine dependence, unspecified, uncomplicated: Secondary | ICD-10-CM | POA: Diagnosis not present

## 2015-10-22 DIAGNOSIS — I252 Old myocardial infarction: Secondary | ICD-10-CM | POA: Diagnosis not present

## 2015-10-24 DIAGNOSIS — I252 Old myocardial infarction: Secondary | ICD-10-CM | POA: Diagnosis not present

## 2015-10-24 DIAGNOSIS — G4730 Sleep apnea, unspecified: Secondary | ICD-10-CM | POA: Diagnosis not present

## 2015-10-24 DIAGNOSIS — Z8547 Personal history of malignant neoplasm of testis: Secondary | ICD-10-CM | POA: Diagnosis not present

## 2015-10-24 DIAGNOSIS — M5126 Other intervertebral disc displacement, lumbar region: Secondary | ICD-10-CM | POA: Diagnosis not present

## 2015-10-24 DIAGNOSIS — I10 Essential (primary) hypertension: Secondary | ICD-10-CM | POA: Diagnosis not present

## 2015-10-24 DIAGNOSIS — Z9889 Other specified postprocedural states: Secondary | ICD-10-CM | POA: Diagnosis not present

## 2015-10-24 DIAGNOSIS — F17200 Nicotine dependence, unspecified, uncomplicated: Secondary | ICD-10-CM | POA: Diagnosis not present

## 2015-10-25 DIAGNOSIS — G4730 Sleep apnea, unspecified: Secondary | ICD-10-CM | POA: Diagnosis not present

## 2015-10-25 DIAGNOSIS — I10 Essential (primary) hypertension: Secondary | ICD-10-CM | POA: Diagnosis not present

## 2015-10-25 DIAGNOSIS — I252 Old myocardial infarction: Secondary | ICD-10-CM | POA: Diagnosis not present

## 2015-10-25 DIAGNOSIS — F17200 Nicotine dependence, unspecified, uncomplicated: Secondary | ICD-10-CM | POA: Diagnosis not present

## 2015-10-25 DIAGNOSIS — Z8547 Personal history of malignant neoplasm of testis: Secondary | ICD-10-CM | POA: Diagnosis not present

## 2015-10-25 DIAGNOSIS — M5126 Other intervertebral disc displacement, lumbar region: Secondary | ICD-10-CM | POA: Diagnosis not present

## 2015-10-29 DIAGNOSIS — J449 Chronic obstructive pulmonary disease, unspecified: Secondary | ICD-10-CM | POA: Diagnosis not present

## 2015-10-29 DIAGNOSIS — I130 Hypertensive heart and chronic kidney disease with heart failure and stage 1 through stage 4 chronic kidney disease, or unspecified chronic kidney disease: Secondary | ICD-10-CM | POA: Diagnosis not present

## 2015-10-29 DIAGNOSIS — M545 Low back pain: Secondary | ICD-10-CM | POA: Diagnosis not present

## 2015-10-29 DIAGNOSIS — I2510 Atherosclerotic heart disease of native coronary artery without angina pectoris: Secondary | ICD-10-CM | POA: Diagnosis not present

## 2016-01-02 DIAGNOSIS — L821 Other seborrheic keratosis: Secondary | ICD-10-CM | POA: Diagnosis not present

## 2016-01-02 DIAGNOSIS — L570 Actinic keratosis: Secondary | ICD-10-CM | POA: Diagnosis not present

## 2016-01-02 DIAGNOSIS — D485 Neoplasm of uncertain behavior of skin: Secondary | ICD-10-CM | POA: Diagnosis not present

## 2016-01-14 DIAGNOSIS — M5126 Other intervertebral disc displacement, lumbar region: Secondary | ICD-10-CM | POA: Diagnosis not present

## 2016-01-14 DIAGNOSIS — M47816 Spondylosis without myelopathy or radiculopathy, lumbar region: Secondary | ICD-10-CM | POA: Diagnosis not present

## 2016-01-29 DIAGNOSIS — M545 Low back pain: Secondary | ICD-10-CM | POA: Diagnosis not present

## 2016-01-29 DIAGNOSIS — J449 Chronic obstructive pulmonary disease, unspecified: Secondary | ICD-10-CM | POA: Diagnosis not present

## 2016-01-29 DIAGNOSIS — I130 Hypertensive heart and chronic kidney disease with heart failure and stage 1 through stage 4 chronic kidney disease, or unspecified chronic kidney disease: Secondary | ICD-10-CM | POA: Diagnosis not present

## 2016-01-29 DIAGNOSIS — I2510 Atherosclerotic heart disease of native coronary artery without angina pectoris: Secondary | ICD-10-CM | POA: Diagnosis not present

## 2016-02-19 ENCOUNTER — Other Ambulatory Visit: Payer: Self-pay | Admitting: Acute Care

## 2016-02-19 DIAGNOSIS — F17210 Nicotine dependence, cigarettes, uncomplicated: Secondary | ICD-10-CM

## 2016-05-03 NOTE — Progress Notes (Signed)
Patient ID: Daniel Fields, male   DOB: 06-Dec-1955, 60 y.o.   MRN: [id number]     05/06/2016 Daniel Fields   01-26-1956  [id number]  Primary Physician Alonza Bogus, MD Primary Cardiologist: Lorretta Harp MD Renae Gloss   HPI:  The patient is a 60 y.o. mildly-overweight married Caucasian male, father of 2, new to me Previously seen by Dr Gwenlyn Found  . He had PCI stenting of his LAD and circumflex back in 2004 by Dr. Christy Sartorius. Catheterized on March 08, 2008, because of chest burning that demonstrated patent stents but otherwise noncritical CAD. He does have GERD. A Myoview performed November 2011 showed subtle ischemia anteroapically felt to be low risk. He had claudication-type symptoms and had normal Dopplers and ABIs, his symptoms then thought to be related to arthritis and/or statin myopathy.saw him 12/24/12 he had a presyncopal episode and was evaluated in the emergency room but denies chest pain or shortness of breath.  Seen by primary Dr Luan Pulling 04/30/16 and complained of more chest pain and dyspnea.  Burning sensation bilateal arms and legs with exertion. Recent PFTls with mild ventilatory defect no brochodilator response , air trapping, high airway resistence And DLCO mildly reduced  On disability no longer working Having numerous episodes of sharp pains in chest with exertion.  Needs new nitro His are old and has not taken any.  Activity limited by hip and knee pain.  Has had 3-4 episodes pain this past week Has clinical COPD    Current Outpatient Prescriptions  Medication Sig Dispense Refill  . allopurinol (ZYLOPRIM) 300 MG tablet Take 300 mg by mouth at bedtime.     Marland Kitchen aspirin 325 MG tablet Take 325 mg by mouth at bedtime.     Marland Kitchen atorvastatin (LIPITOR) 10 MG tablet Take 1 tablet by mouth at bedtime.     Marland Kitchen diltiazem (CARDIZEM CD) 180 MG 24 hr capsule Take 180 mg by mouth at bedtime.     . folic acid (FOLVITE) 1 MG tablet Take 1 mg by mouth at bedtime.     Marland Kitchen  HYDROcodone-acetaminophen (NORCO) 7.5-325 MG per tablet 1 tablet every 6 (six) hours as needed for pain.     . methocarbamol (ROBAXIN) 500 MG tablet Take 1 tablet (500 mg total) by mouth 3 (three) times daily. 21 tablet 0  . metoprolol succinate (TOPROL-XL) 50 MG 24 hr tablet TAKE 1 TABLET BY MOUTH EVERY DAY. PLEASE MAKE APPT FOR REFILLS. 15 tablet 0  . nitroGLYCERIN (NITROSTAT) 0.4 MG SL tablet Place 1 tablet (0.4 mg total) under the tongue every 5 (five) minutes as needed. 25 tablet 3  . pantoprazole (PROTONIX) 40 MG tablet Take 40 mg by mouth at bedtime.     . potassium chloride SA (K-DUR,KLOR-CON) 20 MEQ tablet Take 20 mEq by mouth at bedtime.     . predniSONE (DELTASONE) 10 MG tablet Take 6 tablets day one, 5 tablets day two, 4 tablets day three, 3 tablets day four, 2 tablets day five, then 1 tablet day six 21 tablet 0  . ramipril (ALTACE) 10 MG capsule Take 10 mg by mouth at bedtime. No further refills without an appointment.     No current facility-administered medications for this visit.    No Known Allergies  Social History   Social History  . Marital Status: Married    Spouse Name: N/A  . Number of Children: N/A  . Years of Education: N/A   Occupational History  . Not on file.  Social History Main Topics  . Smoking status: Current Every Day Smoker -- 1.50 packs/day for 40 years    Types: Cigarettes  . Smokeless tobacco: Never Used  . Alcohol Use: No  . Drug Use: No  . Sexual Activity: Not on file   Other Topics Concern  . Not on file   Social History Narrative     Review of Systems: General: negative for chills, fever, night sweats or weight changes.  Cardiovascular: negative for chest pain, dyspnea on exertion, edema, orthopnea, palpitations, paroxysmal nocturnal dyspnea or shortness of breath Dermatological: negative for rash Respiratory: negative for cough or wheezing Urologic: negative for hematuria Abdominal: negative for nausea, vomiting, diarrhea, bright  red blood per rectum, melena, or hematemesis Neurologic: negative for visual changes, syncope, or dizziness All other systems reviewed and are otherwise negative except as noted above.    Blood pressure 138/78, pulse 77, height 5\' 9"  (1.753 m), weight 98.431 kg (217 lb), SpO2 97 %.  Affect appropriate Bronchitic male  HEENT: normal Neck supple with no adenopathy JVP normal no bruits no thyromegaly Lungs clear with no wheezing and good diaphragmatic motion Heart:  S1/S2 no murmur, no rub, gallop or click PMI normal Abdomen: benighn, BS positve, no tenderness, no AAA no bruit.  No HSM or HJR Distal pulses intact with no bruits No edema Neuro non-focal Skin warm and dry No muscular weakness   EKG normal sinus rhythm at 73 with anteroseptal myocardial infarction  ASSESSMENT AND PLAN:   No problem-specific assessment & plan notes found for this encounter.  [id number] with circumflex /LAD interventions SSCP normal ECG unable to walk due to gout and arthritis  F/u Lexiscan Myovue SL nitro called in  [id number] controlled.  Continue current medications and low sodium Dash type diet.   GERD: discussed weight loss and low carb diet  COPD:  Discussed smoking cessation f/u Dr Luan Pulling no active wheezing   Valentina Shaggy

## 2016-05-06 ENCOUNTER — Encounter: Payer: Self-pay | Admitting: Cardiovascular Disease

## 2016-05-06 ENCOUNTER — Ambulatory Visit (INDEPENDENT_AMBULATORY_CARE_PROVIDER_SITE_OTHER): Payer: BLUE CROSS/BLUE SHIELD | Admitting: Cardiovascular Disease

## 2016-05-06 VITALS — BP 138/78 | HR 77 | Ht 69.0 in | Wt 217.0 lb

## 2016-05-06 DIAGNOSIS — R079 Chest pain, unspecified: Secondary | ICD-10-CM | POA: Diagnosis not present

## 2016-05-06 MED ORDER — NITROGLYCERIN 0.4 MG SL SUBL: 0.4000 mg | SUBLINGUAL_TABLET | SUBLINGUAL | Status: DC | PRN

## 2016-05-06 NOTE — Patient Instructions (Signed)
Your physician wants you to follow-up in: 1 year  You will receive a reminder letter in the mail two months in advance. If you don't receive a letter, please call our office to schedule the follow-up appointment.   Your physician recommends that you continue on your current medications as directed. Please refer to the Current Medication list given to you today.    Your physician has requested that you have a lexiscan myoview. For further information please visit [email or web address]. Please follow instruction sheet, as given.      Thank you for choosing [facility] !

## 2016-05-19 ENCOUNTER — Encounter (HOSPITAL_COMMUNITY): Payer: Self-pay

## 2016-05-19 ENCOUNTER — Encounter (HOSPITAL_COMMUNITY)
Admission: RE | Admit: 2016-05-19 | Discharge: 2016-05-19 | Disposition: A | Discharge status: Home / Self Care | Payer: BLUE CROSS/BLUE SHIELD | Source: Ambulatory Visit | Attending: Cardiovascular Disease | Admitting: Cardiovascular Disease

## 2016-05-19 ENCOUNTER — Inpatient Hospital Stay (HOSPITAL_COMMUNITY): Admission: RE | Admit: 2016-05-19 | Payer: BLUE CROSS/BLUE SHIELD | Source: Ambulatory Visit

## 2016-05-19 DIAGNOSIS — R079 Chest pain, unspecified: Secondary | ICD-10-CM | POA: Diagnosis not present

## 2016-05-19 LAB — NM MYOCAR MULTI W/SPECT W/WALL MOTION / EF
CHL CUP NUCLEAR SDS: 1
CHL CUP NUCLEAR SRS: 1
CHL CUP NUCLEAR SSS: 2
LV dias vol: 96 mL (ref 62–150)
LV sys vol: 41 mL
Peak HR: 87 {beats}/min
RATE: 0
Rest HR: 62 {beats}/min
TID: 1.26

## 2016-05-19 MED ORDER — REGADENOSON 0.4 MG/5ML IV SOLN
INTRAVENOUS | Status: AC
  Administered 2016-05-19: 0.4 mg via INTRAVENOUS
  Filled 2016-05-19: qty 5

## 2016-05-19 MED ORDER — TECHNETIUM TC 99M TETROFOSMIN IV KIT
10.0000 | PACK | Freq: Once | INTRAVENOUS | Status: AC | PRN
  Administered 2016-05-19: 10 via INTRAVENOUS

## 2016-05-19 MED ORDER — TECHNETIUM TC 99M TETROFOSMIN IV KIT
30.0000 | PACK | Freq: Once | INTRAVENOUS | Status: AC | PRN
  Administered 2016-05-19: 30 via INTRAVENOUS

## 2016-05-19 MED ORDER — SODIUM CHLORIDE 0.9% FLUSH
INTRAVENOUS | Status: AC
  Administered 2016-05-19: 10 mL via INTRAVENOUS
  Filled 2016-05-19: qty 10

## 2016-07-03 ENCOUNTER — Encounter: Payer: Self-pay | Admitting: Internal Medicine

## 2016-08-12 ENCOUNTER — Encounter: Payer: Self-pay | Admitting: Acute Care

## 2016-09-30 ENCOUNTER — Ambulatory Visit (HOSPITAL_COMMUNITY)
Admission: RE | Admit: 2016-09-30 | Discharge: 2016-09-30 | Disposition: A | Discharge status: Home / Self Care | Payer: BLUE CROSS/BLUE SHIELD | Source: Ambulatory Visit | Attending: Acute Care | Admitting: Acute Care

## 2016-09-30 DIAGNOSIS — I700 Atherosclerosis of aorta: Secondary | ICD-10-CM | POA: Diagnosis not present

## 2016-09-30 DIAGNOSIS — I2510 Atherosclerotic heart disease of native coronary artery without angina pectoris: Secondary | ICD-10-CM | POA: Diagnosis not present

## 2016-09-30 DIAGNOSIS — F17210 Nicotine dependence, cigarettes, uncomplicated: Secondary | ICD-10-CM | POA: Insufficient documentation

## 2016-10-01 ENCOUNTER — Other Ambulatory Visit: Payer: Self-pay | Admitting: Acute Care

## 2016-10-01 ENCOUNTER — Telehealth: Payer: Self-pay | Admitting: Acute Care

## 2016-10-01 DIAGNOSIS — F17210 Nicotine dependence, cigarettes, uncomplicated: Secondary | ICD-10-CM

## 2016-10-01 NOTE — Telephone Encounter (Signed)
I have called Daniel Fields with the results of his low dose CT. I explained that his scan was read a a Lung RADS 2: nodules that are benign in appearance and behavior with a very low likelihood of becoming a clinically active cancer due to size or lack of growth. Recommendation per radiology is for a repeat LDCT in 12 months.I told him we will schedule and order his repeat scan for October of 2018.I also told him that his scan indicated Aortic Atherosclerosis and multi vessel Coronary Artery Calcification. He told me he gets his cholesterol checked annually and that he is currently taking a statin. I told him I will forward the results of the scan to his PCP for follow up. He verbalized understanding of the above and had no further questions regarding the scan. He has my contact information in the event he has questions in the future.

## 2016-11-21 IMAGING — CT CT CHEST LUNG CANCER SCREENING LOW DOSE W/O CM
2 of 4 series · 15 of 40 positions shown, 18 images · non-contrast
Comparison: 09/10/2015

CLINICAL DATA: Current smoker. Sixty pack-year history.
Asymptomatic.

EXAM:
CT CHEST WITHOUT CONTRAST LOW-DOSE FOR LUNG CANCER SCREENING
TECHNIQUE: Multidetector CT imaging of the chest was performed following the
standard protocol without IV contrast.

[Series 2: axial st · axial · 0.69mm/px · z∈[+34,+310]mm · 12 of 67 slices shown, 15 images]
[im 6/67  mediastinal]
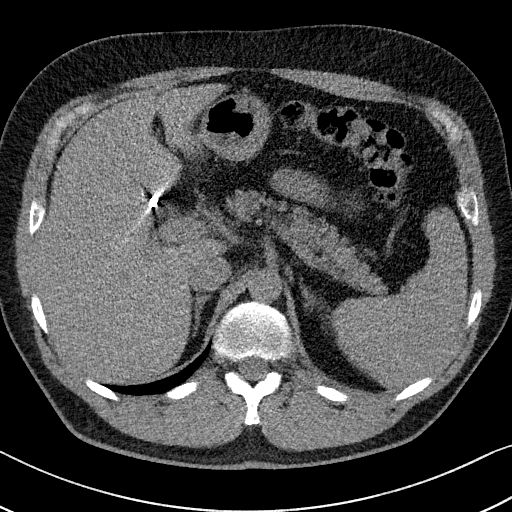
[im 6/67  lung]
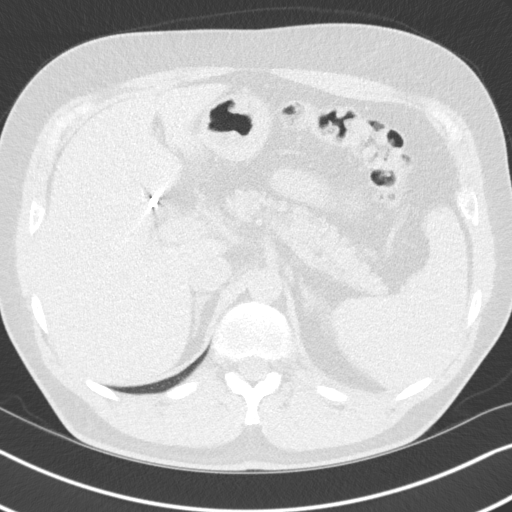
[im 11/67  lung]
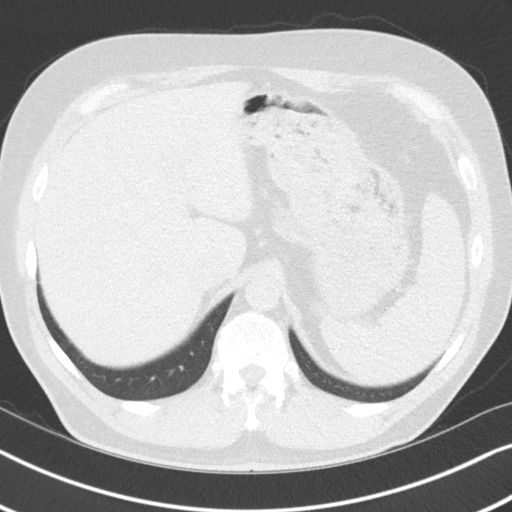
[im 16/67  lung]
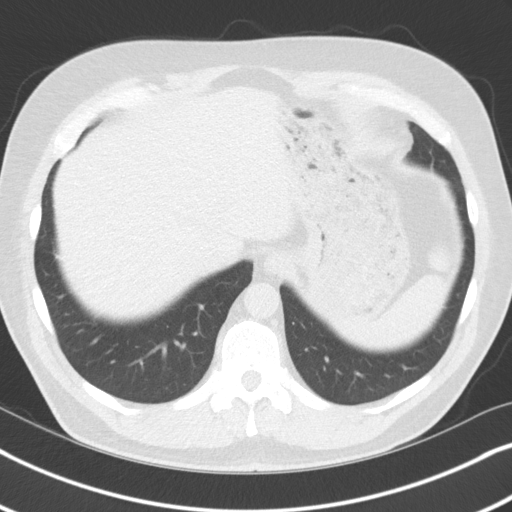
[im 21/67  lung]
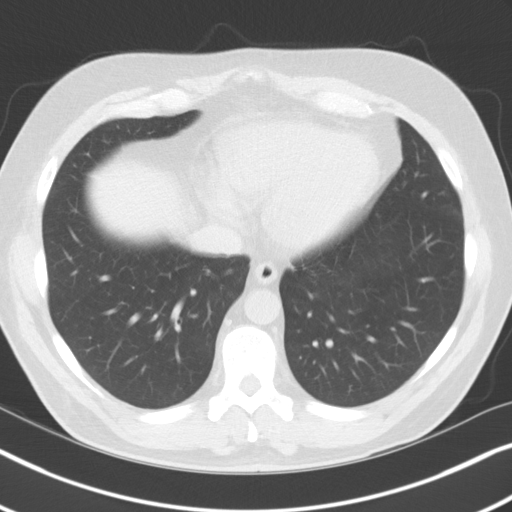
[im 26/67  mediastinal]
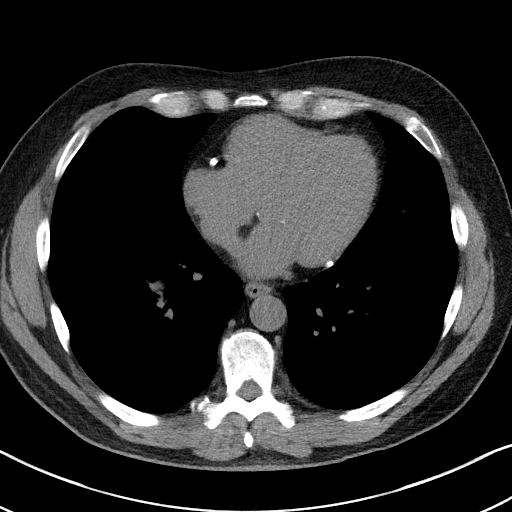
[im 26/67  lung]
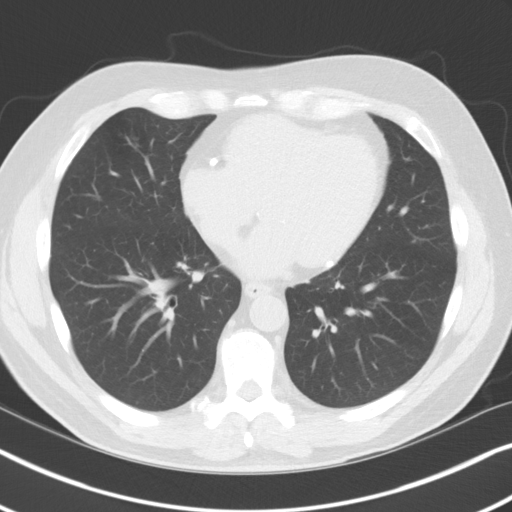
[im 31/67  lung]
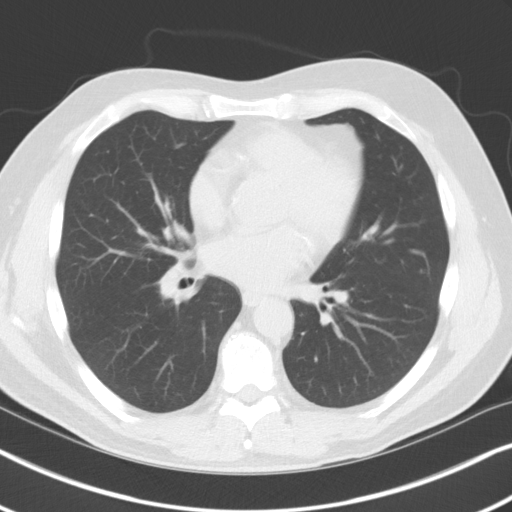
[im 36/67  lung]
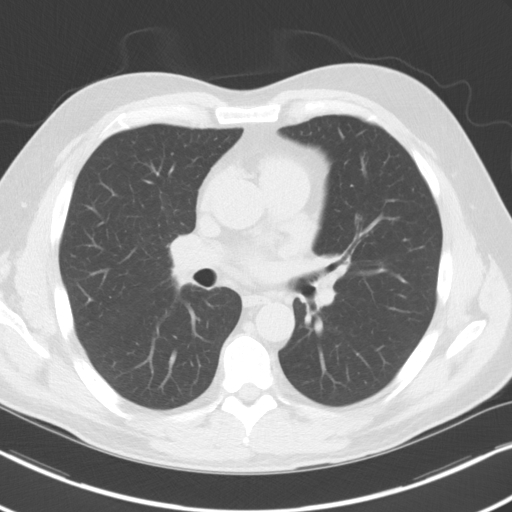
[im 41/67  lung]
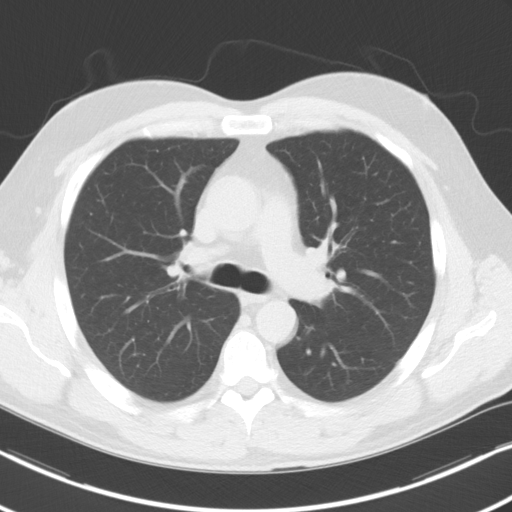
[im 46/67  mediastinal]
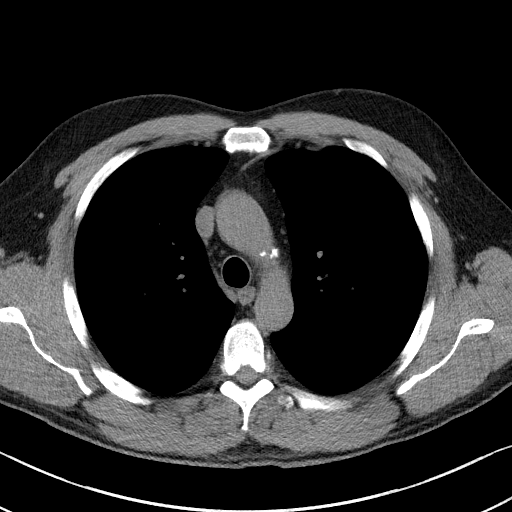
[im 46/67  lung]
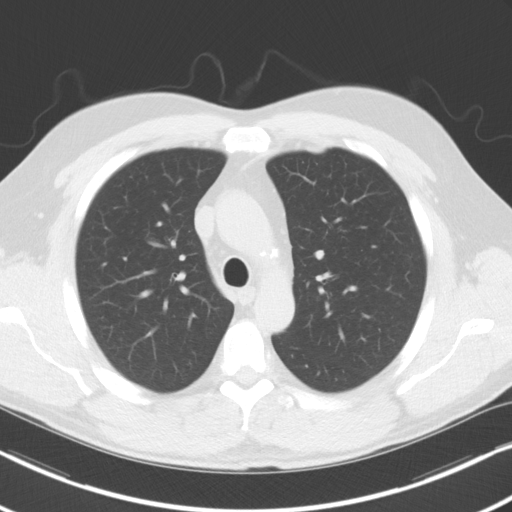
[im 51/67  lung]
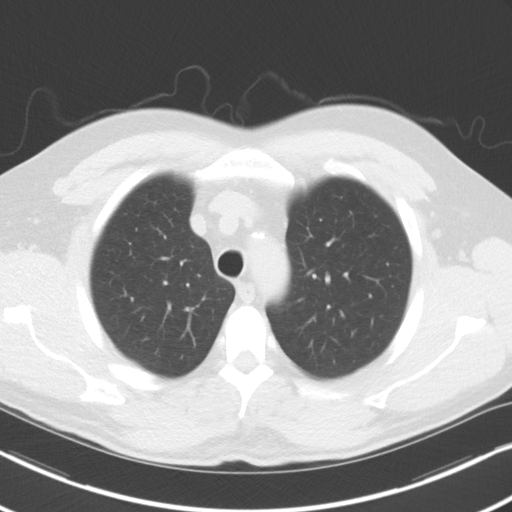
[im 56/67  lung]
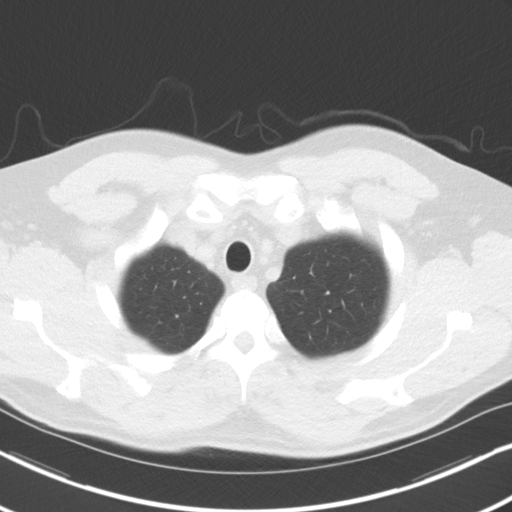
[im 61/67  lung]
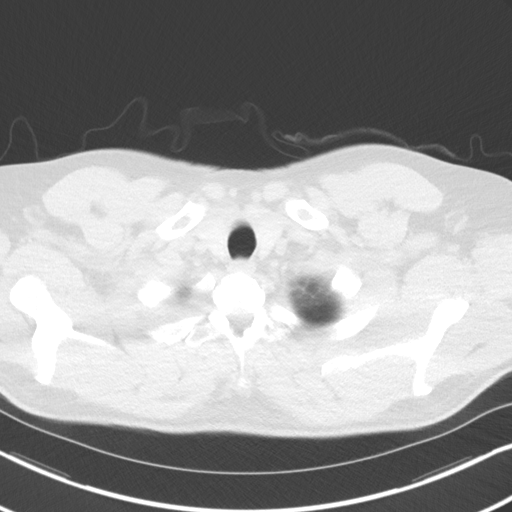

[Series 4: coronal · coronal · 0.69mm/px · 3 of 301 slices shown]
[im 61/301  lung]
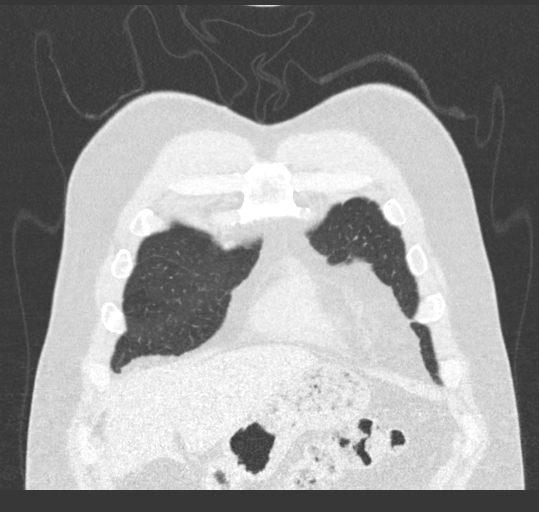
[im 121/301  lung]
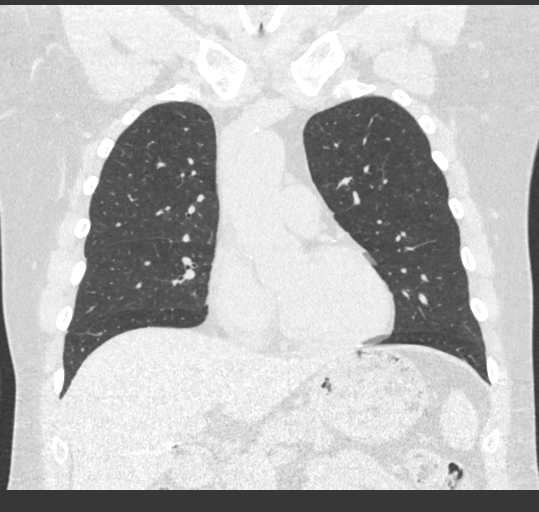
[im 181/301  lung]
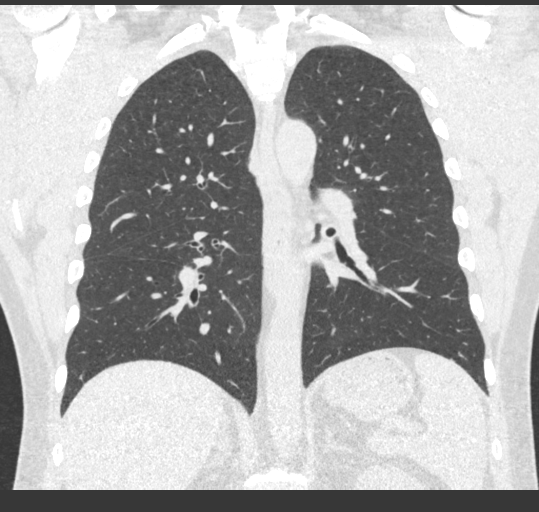

[15 of 40 positions shown; findings below may reference images not displayed]

FINDINGS: Cardiovascular: The heart size appears normal. Aortic
atherosclerosis identified. Calcification within the RCA, LAD and
left circumflex coronary artery noted.

Mediastinum/Nodes: The trachea appears patent and is midline. Normal
appearance of the esophagus. No enlarged mediastinal or hilar lymph
nodes.

Lungs/Pleura: No pleural effusion identified. Moderate changes of
centrilobular emphysema. Small pulmonary nodule within the left
upper lobe is unchanged from previous exam with an equivalent
diameter of 2.7 mm, image number 119 of series 3. No new nodules
identified.

Upper Abdomen: There is no focal liver abnormality. Previous
cholecystectomy. No biliary dilatation. No acute abnormality noted.

Musculoskeletal: No aggressive lytic or sclerotic bone lesions
identified.
IMPRESSION: 1. Lung-RADS Category 2, benign appearance or behavior. Continue
annual screening with low-dose chest CT without contrast in 12
months
2. Aortic atherosclerosis and multi vessel coronary artery
calcification.

## 2017-10-21 ENCOUNTER — Encounter: Payer: Self-pay | Admitting: Acute Care

## 2017-11-12 ENCOUNTER — Ambulatory Visit (HOSPITAL_COMMUNITY)
Admission: RE | Admit: 2017-11-12 | Discharge: 2017-11-12 | Disposition: A | Discharge status: Home / Self Care | Payer: BLUE CROSS/BLUE SHIELD | Source: Ambulatory Visit | Attending: Acute Care | Admitting: Acute Care

## 2017-11-12 DIAGNOSIS — F17210 Nicotine dependence, cigarettes, uncomplicated: Secondary | ICD-10-CM | POA: Insufficient documentation

## 2017-11-12 DIAGNOSIS — I700 Atherosclerosis of aorta: Secondary | ICD-10-CM | POA: Insufficient documentation

## 2017-11-12 DIAGNOSIS — J439 Emphysema, unspecified: Secondary | ICD-10-CM | POA: Insufficient documentation

## 2017-11-12 DIAGNOSIS — I2510 Atherosclerotic heart disease of native coronary artery without angina pectoris: Secondary | ICD-10-CM | POA: Diagnosis not present

## 2017-12-03 ENCOUNTER — Encounter: Payer: Self-pay | Admitting: *Deleted

## 2017-12-03 ENCOUNTER — Telehealth: Payer: Self-pay | Admitting: Acute Care

## 2017-12-03 DIAGNOSIS — Z122 Encounter for screening for malignant neoplasm of respiratory organs: Secondary | ICD-10-CM

## 2017-12-03 DIAGNOSIS — F17210 Nicotine dependence, cigarettes, uncomplicated: Secondary | ICD-10-CM

## 2017-12-03 NOTE — Telephone Encounter (Signed)
I attempted to call pt with results of low dose chest ct.  The number we have is an incorrect number.  Letter mailed to pt with results of Ct,  I also asked him to call our office to update his contact information.  Copy of CT sent to PCP. Order placed for 1 yr f/u scan.

## 2018-01-07 DIAGNOSIS — M5090 Cervical disc disorder, unspecified, unspecified cervical region: Secondary | ICD-10-CM | POA: Diagnosis not present

## 2018-01-07 DIAGNOSIS — M545 Low back pain: Secondary | ICD-10-CM | POA: Diagnosis not present

## 2018-01-07 DIAGNOSIS — I25119 Atherosclerotic heart disease of native coronary artery with unspecified angina pectoris: Secondary | ICD-10-CM | POA: Diagnosis not present

## 2018-01-07 DIAGNOSIS — J449 Chronic obstructive pulmonary disease, unspecified: Secondary | ICD-10-CM | POA: Diagnosis not present

## 2018-04-06 ENCOUNTER — Other Ambulatory Visit (HOSPITAL_COMMUNITY): Payer: Self-pay | Admitting: Pulmonary Disease

## 2018-04-06 DIAGNOSIS — M545 Low back pain: Secondary | ICD-10-CM | POA: Diagnosis not present

## 2018-04-06 DIAGNOSIS — J449 Chronic obstructive pulmonary disease, unspecified: Secondary | ICD-10-CM | POA: Diagnosis not present

## 2018-04-06 DIAGNOSIS — M1990 Unspecified osteoarthritis, unspecified site: Secondary | ICD-10-CM | POA: Diagnosis not present

## 2018-04-06 DIAGNOSIS — M7989 Other specified soft tissue disorders: Secondary | ICD-10-CM

## 2018-04-06 DIAGNOSIS — I25119 Atherosclerotic heart disease of native coronary artery with unspecified angina pectoris: Secondary | ICD-10-CM | POA: Diagnosis not present

## 2018-04-06 DIAGNOSIS — Z79891 Long term (current) use of opiate analgesic: Secondary | ICD-10-CM | POA: Diagnosis not present

## 2018-04-09 ENCOUNTER — Ambulatory Visit (HOSPITAL_COMMUNITY)
Admission: RE | Admit: 2018-04-09 | Discharge: 2018-04-09 | Disposition: A | Discharge status: Home / Self Care | Payer: Medicare Other | Source: Ambulatory Visit | Attending: Pulmonary Disease | Admitting: Pulmonary Disease

## 2018-04-09 DIAGNOSIS — M7989 Other specified soft tissue disorders: Secondary | ICD-10-CM

## 2018-04-09 DIAGNOSIS — R2241 Localized swelling, mass and lump, right lower limb: Secondary | ICD-10-CM | POA: Diagnosis present

## 2018-04-09 DIAGNOSIS — R600 Localized edema: Secondary | ICD-10-CM | POA: Diagnosis not present

## 2018-07-07 DIAGNOSIS — G4733 Obstructive sleep apnea (adult) (pediatric): Secondary | ICD-10-CM | POA: Diagnosis not present

## 2018-07-07 DIAGNOSIS — I25119 Atherosclerotic heart disease of native coronary artery with unspecified angina pectoris: Secondary | ICD-10-CM | POA: Diagnosis not present

## 2018-07-07 DIAGNOSIS — M545 Low back pain: Secondary | ICD-10-CM | POA: Diagnosis not present

## 2018-07-07 DIAGNOSIS — J449 Chronic obstructive pulmonary disease, unspecified: Secondary | ICD-10-CM | POA: Diagnosis not present

## 2018-08-23 ENCOUNTER — Encounter: Payer: Self-pay | Admitting: Cardiology

## 2018-08-23 NOTE — Progress Notes (Signed)
Cardiology Office Note  Date: 08/24/2018   ID: Daniel Fields, DOB [birth year], MRN [id number]  PCP: Sinda Du, MD  Consulting Cardiologist: Rozann Lesches, MD   Chief Complaint  Patient presents with  . Chest Pain    History of Present Illness: Daniel Fields is a 62 y.o. male referred for cardiology consultation by Dr. Luan Pulling for evaluation of chest pain.  He is somewhat vague in describing his symptoms to me today.  He states that he has intermittent unusual feelings in his chest, sometimes a sense of palpitations or skip, sometimes more of a tightness, sometimes associated with anxiety.  This has been going on for the last few years, but it sounds like he had a more intense episode back in July when he saw Dr. Luan Pulling and was referred for further work-up.  Since that time he reports no progressive symptoms.  Record review finds previous cardiology follow-up with Dr. Gwenlyn Found as of 2015 and more recently Dr. Johnsie Cancel in 2017.  Cardiac history includes stent interventions to the mid circumflex and mid RCA in 2004 with follow-up angiography in 2009 showing patent stent sites.  Most recently he underwent a low risk Lexiscan Myoview in 2017 as detailed below.  Reviewed his medications.  Cardiac regimen includes aspirin, Lipitor, Cardizem CD, Toprol-XL, Altace, and as needed nitroglycerin.  I personally reviewed his recent ECG from 06/07/2018 which showed sinus rhythm.  Past Medical History:  Diagnosis Date  . Arthritis   . Blurred vision   . CAD (coronary artery disease)    Stent to circumflex and RCA 2004  . Chronic back pain   . COPD (chronic obstructive pulmonary disease) ([place])   . Depression   . Gout   . IBS (irritable bowel syndrome)   . Mixed hyperlipidemia   . Sleep apnea    CPAP  . Testicular cancer [facility])    Status post resection and chemotherapy, age early 39s    Past Surgical History:  Procedure Laterality Date  . CARDIAC CATHETERIZATION  2009  .  CHOLECYSTECTOMY    . COLONOSCOPY WITH ESOPHAGOGASTRODUODENOSCOPY (EGD) N/A 07/29/2013   Procedure: COLONOSCOPY WITH ESOPHAGOGASTRODUODENOSCOPY (EGD);  Surgeon: Daneil Dolin, MD;  [facility];  Service: Endoscopy;  Laterality: N/A;  12:45  . CORONARY ANGIOPLASTY WITH STENT PLACEMENT  2004   3 total stents  . FOOT SURGERY Right   . SURGERY SCROTAL / TESTICULAR     right testicular cancer    Current Outpatient Medications  Medication Sig Dispense Refill  . allopurinol (ZYLOPRIM) 300 MG tablet Take 300 mg by mouth at bedtime.     Marland Kitchen aspirin EC 81 MG tablet Take 81 mg by mouth daily.    Marland Kitchen atorvastatin (LIPITOR) 10 MG tablet Take 1 tablet by mouth at bedtime.     Marland Kitchen diltiazem (CARDIZEM CD) 180 MG 24 hr capsule Take 180 mg by mouth at bedtime.     . folic acid (FOLVITE) 1 MG tablet Take 1 mg by mouth at bedtime.     Marland Kitchen HYDROcodone-acetaminophen (NORCO) 7.5-325 MG per tablet 1 tablet every 6 (six) hours as needed for pain.     . methocarbamol (ROBAXIN) 500 MG tablet Take 1 tablet (500 mg total) by mouth 3 (three) times daily. 21 tablet 0  . metoprolol succinate (TOPROL-XL) 50 MG 24 hr tablet TAKE 1 TABLET BY MOUTH EVERY DAY. PLEASE MAKE APPT FOR REFILLS. 15 tablet 0  . nitroGLYCERIN (NITROSTAT) 0.4 MG SL tablet Place 1 tablet (0.4 mg  total) under the tongue every 5 (five) minutes as needed. 25 tablet 3  . pantoprazole (PROTONIX) 40 MG tablet Take 40 mg by mouth at bedtime.     . potassium chloride SA (K-DUR,KLOR-CON) 20 MEQ tablet Take 20 mEq by mouth at bedtime.     . predniSONE (DELTASONE) 10 MG tablet Take 6 tablets day one, 5 tablets day two, 4 tablets day three, 3 tablets day four, 2 tablets day five, then 1 tablet day six 21 tablet 0  . ramipril (ALTACE) 10 MG capsule Take 10 mg by mouth at bedtime. No further refills without an appointment.     No current facility-administered medications for this visit.    Allergies:  Patient has no known allergies.   Social History: The  patient  reports that he has been smoking cigarettes. He has a 60.00 pack-year smoking history. He has never used smokeless tobacco. He reports that he does not drink alcohol or use drugs.   Family History: The patient's family history includes Breast cancer in his cousin; Heart disease in his father; Hypertension in his father; Kidney failure in his brother; Stroke in his father.   ROS:  Please see the history of present illness. Otherwise, complete review of systems is positive for chronic back pain.  All other systems are reviewed and negative.   Physical Exam: VS:  BP 126/78 (BP Location: Left Arm)   Pulse 65   Ht 5\' 10"  (1.778 m)   Wt 200 lb (90.7 kg)   SpO2 98%   BMI 28.70 kg/m , BMI Body mass index is 28.7 kg/m.  Wt Readings from Last 3 Encounters:  08/24/18 200 lb (90.7 kg)  05/06/16 217 lb (98.4 kg)  06/20/14 217 lb (98.4 kg)    General: Patient appears comfortable at rest. HEENT: Conjunctiva and lids normal, oropharynx clear. Neck: Supple, no elevated JVP or carotid bruits, no thyromegaly. Lungs: Clear to auscultation, nonlabored breathing at rest. Cardiac: Regular rate and rhythm, no S3 or significant systolic murmur, no pericardial rub. Abdomen: Soft, nontender, bowel sounds present, no guarding or rebound. Extremities: No pitting edema, distal pulses 2+. Skin: Warm and dry. Musculoskeletal: No kyphosis. Neuropsychiatric: Alert and oriented x3, affect grossly appropriate.  ECG: I personally reviewed the tracing from 05/06/2016 which showed sinus arrhythmia with decreased R wave progression.  Recent Labwork:  None available for review.  Other Studies Reviewed Today:  Cardiac catheterization 03/08/2008: Left main normal, LAD normal, widely patent stent site and midportion of circumflex, widely patent stent site and midportion of RCA, left ventriculography with LVEF 60% and no wall motion abnormality.  Lexiscan Myoview 05/19/2016:  There was no ST segment deviation  noted during stress.  The study is normal.  This is a low risk study.  Nuclear stress EF: 58%.  Assessment and Plan:  1.  CAD with history of previous stent intervention to the circumflex and RCA, last assessed at angiography in 2009.  He reports somewhat vague, but recurring episodes of chest tightness, sometimes anxiety or brief palpitations.  He had a more intense episode of chest pain back in July prompting referral back to the cardiology division.  He states that he has been compliant with his medications.  Reduce aspirin to 81 mg daily for now.  ECG showed no acute ST segment changes from around the time of his chest pain event.  Last ischemic evaluation was greater than 2 years ago.  We have discussed follow-up evaluation with a Lexiscan Myoview.  He has chronic back  pain which limits ambulation, is also on two AV nodal blockers, so GXT will be avoided.  2.  Mixed hyperlipidemia, continues on Lipitor with follow-up per Dr. Luan Pulling.  3.  Tobacco abuse.  Smoking cessation discussed.   Current medicines were reviewed with the patient today.   Orders Placed This Encounter  Procedures  . NM Myocar Multi W/Spect W/Wall Motion / EF    Disposition: Call with test results.  If low risk, anticipate annual follow-up.  Signed, Satira Sark, MD, [facility] 08/24/2018 9:22 AM    [facility] at [facility] [address], [place], [place] [address] Phone: [phone]; Fax: [phone]

## 2018-08-24 ENCOUNTER — Ambulatory Visit: Payer: Medicare Other | Admitting: Cardiology

## 2018-08-24 ENCOUNTER — Encounter: Payer: Self-pay | Admitting: Cardiology

## 2018-08-24 VITALS — BP 126/78 | HR 65 | Ht 70.0 in | Wt 200.0 lb

## 2018-08-24 DIAGNOSIS — I25119 Atherosclerotic heart disease of native coronary artery with unspecified angina pectoris: Secondary | ICD-10-CM

## 2018-08-24 NOTE — Patient Instructions (Addendum)
Your physician wants you to follow-up in: 1 year with Dr.McDowell You will receive a reminder letter in the mail two months in advance. If you don't receive a letter, please call our office to schedule the follow-up appointment.   Take aspirin 81 mg daily  If you need a refill on your cardiac medications before your next appointment, please call your pharmacy.     Your physician has requested that you have a lexiscan myoview. For further information please visit [email or web address]. Please follow instruction sheet, as given.      No lab work ordered today      Thank you for choosing [facility] !

## 2018-09-10 ENCOUNTER — Encounter (HOSPITAL_COMMUNITY)
Admission: RE | Admit: 2018-09-10 | Discharge: 2018-09-10 | Disposition: A | Discharge status: Home / Self Care | Payer: Medicare Other | Source: Ambulatory Visit | Attending: Cardiology | Admitting: Cardiology

## 2018-09-10 ENCOUNTER — Encounter (HOSPITAL_COMMUNITY): Payer: Self-pay

## 2018-09-10 ENCOUNTER — Ambulatory Visit (HOSPITAL_COMMUNITY)
Admission: RE | Admit: 2018-09-10 | Discharge: 2018-09-10 | Disposition: A | Discharge status: Home / Self Care | Payer: Medicare Other | Source: Ambulatory Visit | Attending: Cardiology | Admitting: Cardiology

## 2018-09-10 DIAGNOSIS — I25119 Atherosclerotic heart disease of native coronary artery with unspecified angina pectoris: Secondary | ICD-10-CM | POA: Diagnosis not present

## 2018-09-10 HISTORY — DX: Essential (primary) hypertension: I10

## 2018-09-10 LAB — NM MYOCAR MULTI W/SPECT W/WALL MOTION / EF
CSEPPHR: 81 {beats}/min
LV dias vol: 111 mL (ref 62–150)
LV sys vol: 50 mL
RATE: 0.35
Rest HR: 55 {beats}/min
SDS: 0
SRS: 1
SSS: 1
TID: 1.4

## 2018-09-10 MED ORDER — SODIUM CHLORIDE 0.9% FLUSH
INTRAVENOUS | Status: AC
  Administered 2018-09-10: 10 mL via INTRAVENOUS
  Filled 2018-09-10: qty 10

## 2018-09-10 MED ORDER — TECHNETIUM TC 99M TETROFOSMIN IV KIT
10.0000 | PACK | Freq: Once | INTRAVENOUS | Status: AC | PRN
  Administered 2018-09-10: 10.4 via INTRAVENOUS

## 2018-09-10 MED ORDER — REGADENOSON 0.4 MG/5ML IV SOLN
INTRAVENOUS | Status: AC
  Administered 2018-09-10: 0.4 mg via INTRAVENOUS
  Filled 2018-09-10: qty 5

## 2018-09-10 MED ORDER — TECHNETIUM TC 99M TETROFOSMIN IV KIT
30.0000 | PACK | Freq: Once | INTRAVENOUS | Status: AC | PRN
  Administered 2018-09-10: 32 via INTRAVENOUS

## 2018-09-24 DIAGNOSIS — M545 Low back pain: Secondary | ICD-10-CM | POA: Diagnosis not present

## 2018-09-24 DIAGNOSIS — G4733 Obstructive sleep apnea (adult) (pediatric): Secondary | ICD-10-CM | POA: Diagnosis not present

## 2018-09-24 DIAGNOSIS — J449 Chronic obstructive pulmonary disease, unspecified: Secondary | ICD-10-CM | POA: Diagnosis not present

## 2018-09-24 DIAGNOSIS — I25119 Atherosclerotic heart disease of native coronary artery with unspecified angina pectoris: Secondary | ICD-10-CM | POA: Diagnosis not present

## 2018-10-08 DIAGNOSIS — Z0000 Encounter for general adult medical examination without abnormal findings: Secondary | ICD-10-CM | POA: Diagnosis not present

## 2018-10-08 DIAGNOSIS — Z23 Encounter for immunization: Secondary | ICD-10-CM | POA: Diagnosis not present

## 2018-11-16 ENCOUNTER — Ambulatory Visit (HOSPITAL_COMMUNITY)
Admission: RE | Admit: 2018-11-16 | Discharge: 2018-11-16 | Disposition: A | Discharge status: Home / Self Care | Payer: Medicare Other | Source: Ambulatory Visit | Attending: Acute Care | Admitting: Acute Care

## 2018-11-16 DIAGNOSIS — F17210 Nicotine dependence, cigarettes, uncomplicated: Secondary | ICD-10-CM | POA: Diagnosis not present

## 2018-11-16 DIAGNOSIS — Z122 Encounter for screening for malignant neoplasm of respiratory organs: Secondary | ICD-10-CM | POA: Diagnosis present

## 2018-11-19 ENCOUNTER — Other Ambulatory Visit: Payer: Self-pay | Admitting: Acute Care

## 2018-11-19 DIAGNOSIS — Z122 Encounter for screening for malignant neoplasm of respiratory organs: Secondary | ICD-10-CM

## 2018-11-19 DIAGNOSIS — F17210 Nicotine dependence, cigarettes, uncomplicated: Secondary | ICD-10-CM

## 2018-11-19 DIAGNOSIS — Z87891 Personal history of nicotine dependence: Secondary | ICD-10-CM

## 2019-02-11 DIAGNOSIS — M5432 Sciatica, left side: Secondary | ICD-10-CM | POA: Diagnosis not present

## 2019-02-11 DIAGNOSIS — J449 Chronic obstructive pulmonary disease, unspecified: Secondary | ICD-10-CM | POA: Diagnosis not present

## 2019-02-11 DIAGNOSIS — G4733 Obstructive sleep apnea (adult) (pediatric): Secondary | ICD-10-CM | POA: Diagnosis not present

## 2019-02-11 DIAGNOSIS — I25119 Atherosclerotic heart disease of native coronary artery with unspecified angina pectoris: Secondary | ICD-10-CM | POA: Diagnosis not present

## 2019-02-15 ENCOUNTER — Other Ambulatory Visit: Payer: Self-pay | Admitting: Pulmonary Disease

## 2019-02-15 ENCOUNTER — Other Ambulatory Visit (HOSPITAL_COMMUNITY): Payer: Self-pay | Admitting: Pulmonary Disease

## 2019-02-15 DIAGNOSIS — M545 Low back pain, unspecified: Secondary | ICD-10-CM

## 2019-02-22 ENCOUNTER — Ambulatory Visit (HOSPITAL_COMMUNITY): Payer: Medicare Other

## 2019-02-22 ENCOUNTER — Encounter (HOSPITAL_COMMUNITY): Payer: Self-pay

## 2019-03-22 ENCOUNTER — Ambulatory Visit (HOSPITAL_COMMUNITY): Payer: Medicare Other

## 2019-05-30 ENCOUNTER — Ambulatory Visit (HOSPITAL_COMMUNITY)
Admission: RE | Admit: 2019-05-30 | Discharge: 2019-05-30 | Disposition: A | Discharge status: Home / Self Care | Payer: Medicare Other | Source: Ambulatory Visit | Attending: Pulmonary Disease | Admitting: Pulmonary Disease

## 2019-05-30 ENCOUNTER — Other Ambulatory Visit: Payer: Self-pay

## 2019-05-30 DIAGNOSIS — M545 Low back pain, unspecified: Secondary | ICD-10-CM

## 2019-06-22 DIAGNOSIS — J449 Chronic obstructive pulmonary disease, unspecified: Secondary | ICD-10-CM | POA: Diagnosis not present

## 2019-06-22 DIAGNOSIS — F17200 Nicotine dependence, unspecified, uncomplicated: Secondary | ICD-10-CM | POA: Diagnosis not present

## 2019-06-22 DIAGNOSIS — M545 Low back pain: Secondary | ICD-10-CM | POA: Diagnosis not present

## 2019-06-22 DIAGNOSIS — G4733 Obstructive sleep apnea (adult) (pediatric): Secondary | ICD-10-CM | POA: Diagnosis not present

## 2019-06-23 DIAGNOSIS — Z79891 Long term (current) use of opiate analgesic: Secondary | ICD-10-CM | POA: Diagnosis not present

## 2019-09-12 DIAGNOSIS — Z23 Encounter for immunization: Secondary | ICD-10-CM | POA: Diagnosis not present

## 2019-09-14 DIAGNOSIS — I10 Essential (primary) hypertension: Secondary | ICD-10-CM | POA: Diagnosis not present

## 2019-09-14 DIAGNOSIS — I252 Old myocardial infarction: Secondary | ICD-10-CM | POA: Diagnosis not present

## 2019-09-14 DIAGNOSIS — M545 Low back pain: Secondary | ICD-10-CM | POA: Diagnosis not present

## 2019-09-14 DIAGNOSIS — I480 Paroxysmal atrial fibrillation: Secondary | ICD-10-CM | POA: Diagnosis not present

## 2019-09-28 ENCOUNTER — Telehealth: Payer: Self-pay | Admitting: Cardiology

## 2019-09-28 NOTE — Telephone Encounter (Signed)
Virtual Visit Pre-Appointment Phone Call  "(Name), I am calling you today to discuss your upcoming appointment. We are currently trying to limit exposure to the virus that causes COVID-19 by seeing patients at home rather than in the office."  1. "What is the BEST phone number to call the day of the visit?" - include this in appointment notes  2. Do you have or have access to (through a family member/friend) a smartphone with video capability that we can use for your visit?" a. If yes - list this number in appt notes as cell (if different from BEST phone #) and list the appointment type as a VIDEO visit in appointment notes b. If no - list the appointment type as a PHONE visit in appointment notes  3. Confirm consent - "In the setting of the current Covid19 crisis, you are scheduled for a (phone or video) visit with your provider on (date) at (time).  Just as we do with many in-office visits, in order for you to participate in this visit, we must obtain consent.  If you'd like, I can send this to your mychart (if signed up) or email for you to review.  Otherwise, I can obtain your verbal consent now.  All virtual visits are billed to your insurance company just like a normal visit would be.  By agreeing to a virtual visit, we'd like you to understand that the technology does not allow for your provider to perform an examination, and thus may limit your provider's ability to fully assess your condition. If your provider identifies any concerns that need to be evaluated in person, we will make arrangements to do so.  Finally, though the technology is pretty good, we cannot assure that it will always work on either your or our end, and in the setting of a video visit, we may have to convert it to a phone-only visit.  In either situation, we cannot ensure that we have a secure connection.  Are you willing to proceed?" STAFF: Did the patient verbally acknowledge consent to telehealth visit? Document  YES/NO here: yes  4. Advise patient to be prepared - "Two hours prior to your appointment, go ahead and check your blood pressure, pulse, oxygen saturation, and your weight (if you have the equipment to check those) and write them all down. When your visit starts, your provider will ask you for this information. If you have an Apple Watch or Kardia device, please plan to have heart rate information ready on the day of your appointment. Please have a pen and paper handy nearby the day of the visit as well."  5. Give patient instructions for MyChart download to smartphone OR [email or web address] as below if video visit (depending on what platform provider is using)  6. Inform patient they will receive a phone call 15 minutes prior to their appointment time (may be from unknown caller ID) so they should be prepared to answer    TELEPHONE CALL NOTE  Carney Bern has been deemed a candidate for a follow-up tele-health visit to limit community exposure during the Covid-19 pandemic. I spoke with the patient via phone to ensure availability of phone/video source, confirm preferred email & phone number, and discuss instructions and expectations.  I reminded QUASON LAURITSEN to be prepared with any vital sign and/or heart rhythm information that could potentially be obtained via home monitoring, at the time of his visit. I reminded XAVYER SULEWSKI to expect a phone call prior to  his visit.  Weston Anna 09/28/2019 3:34 PM   INSTRUCTIONS FOR DOWNLOADING THE MYCHART APP TO SMARTPHONE  - The patient must first make sure to have activated MyChart and know their login information - If Apple, go to [employer] and type in MyChart in the search bar and download the app. If Android, ask patient to go to [employer] and type in [place] in the search bar and download the app. The app is free but as with any other app downloads, their phone may require them to verify saved payment information or Apple/Android  password.  - The patient will need to then log into the app with their MyChart username and password, and select [facility] as their healthcare provider to link the account. When it is time for your visit, go to the MyChart app, find appointments, and click Begin Video Visit. Be sure to Select Allow for your device to access the Microphone and Camera for your visit. You will then be connected, and your provider will be with you shortly.  **If they have any issues connecting, or need assistance please contact MyChart service desk [phone])**  **If using a computer, in order to ensure the best quality for their visit they will need to use either of the following Internet Browsers: [employer], or Google Chrome**  IF USING DOXIMITY or DOXY.ME - The patient will receive a link just prior to their visit by text.     FULL LENGTH CONSENT FOR TELE-HEALTH VISIT   I hereby voluntarily request, consent and authorize [facility] and its employed or contracted physicians, physician assistants, nurse practitioners or other licensed health care professionals (the Practitioner), to provide me with telemedicine health care services (the Services") as deemed necessary by the treating Practitioner. I acknowledge and consent to receive the Services by the Practitioner via telemedicine. I understand that the telemedicine visit will involve communicating with the Practitioner through live audiovisual communication technology and the disclosure of certain medical information by electronic transmission. I acknowledge that I have been given the opportunity to request an in-person assessment or other available alternative prior to the telemedicine visit and am voluntarily participating in the telemedicine visit.  I understand that I have the right to withhold or withdraw my consent to the use of telemedicine in the course of my care at any time, without affecting my right to future care or treatment,  and that the Practitioner or I may terminate the telemedicine visit at any time. I understand that I have the right to inspect all information obtained and/or recorded in the course of the telemedicine visit and may receive copies of available information for a reasonable fee.  I understand that some of the potential risks of receiving the Services via telemedicine include:   Delay or interruption in medical evaluation due to technological equipment failure or disruption;  Information transmitted may not be sufficient (e.g. poor resolution of images) to allow for appropriate medical decision making by the Practitioner; and/or   In rare instances, security protocols could fail, causing a breach of personal health information.  Furthermore, I acknowledge that it is my responsibility to provide information about my medical history, conditions and care that is complete and accurate to the best of my ability. I acknowledge that Practitioner's advice, recommendations, and/or decision may be based on factors not within their control, such as incomplete or inaccurate data provided by me or distortions of diagnostic images or specimens that may result from electronic transmissions. I  understand that the practice of medicine is not an exact science and that Practitioner makes no warranties or guarantees regarding treatment outcomes. I acknowledge that I will receive a copy of this consent concurrently upon execution via email to the email address I last provided but may also request a printed copy by calling the office of [facility].    I understand that my insurance will be billed for this visit.   I have read or had this consent read to me.  I understand the contents of this consent, which adequately explains the benefits and risks of the Services being provided via telemedicine.   I have been provided ample opportunity to ask questions regarding this consent and the Services and have had my questions  answered to my satisfaction.  I give my informed consent for the services to be provided through the use of telemedicine in my medical care  By participating in this telemedicine visit I agree to the above.

## 2019-09-30 ENCOUNTER — Encounter: Payer: Self-pay | Admitting: Cardiology

## 2019-09-30 ENCOUNTER — Telehealth (INDEPENDENT_AMBULATORY_CARE_PROVIDER_SITE_OTHER): Payer: Medicare Other | Admitting: Cardiology

## 2019-09-30 DIAGNOSIS — E782 Mixed hyperlipidemia: Secondary | ICD-10-CM

## 2019-09-30 DIAGNOSIS — Z720 Tobacco use: Secondary | ICD-10-CM | POA: Diagnosis not present

## 2019-09-30 DIAGNOSIS — I25119 Atherosclerotic heart disease of native coronary artery with unspecified angina pectoris: Secondary | ICD-10-CM | POA: Diagnosis not present

## 2019-09-30 DIAGNOSIS — M5416 Radiculopathy, lumbar region: Secondary | ICD-10-CM | POA: Diagnosis not present

## 2019-09-30 DIAGNOSIS — M5136 Other intervertebral disc degeneration, lumbar region: Secondary | ICD-10-CM | POA: Diagnosis not present

## 2019-09-30 DIAGNOSIS — Z9889 Other specified postprocedural states: Secondary | ICD-10-CM | POA: Diagnosis not present

## 2019-09-30 DIAGNOSIS — M549 Dorsalgia, unspecified: Secondary | ICD-10-CM | POA: Diagnosis not present

## 2019-09-30 MED ORDER — NITROGLYCERIN 0.4 MG SL SUBL: 0.4000 mg | SUBLINGUAL_TABLET | SUBLINGUAL | 3 refills | Status: DC | PRN

## 2019-09-30 NOTE — Progress Notes (Signed)
Virtual Visit via Telephone Note   This visit type was conducted due to national recommendations for restrictions regarding the COVID-19 Pandemic (e.g. social distancing) in an effort to limit this patient's exposure and mitigate transmission in our community.  Due to his co-morbid illnesses, this patient is at least at moderate risk for complications without adequate follow up.  This format is felt to be most appropriate for this patient at this time.  The patient did not have access to video technology/had technical difficulties with video requiring transitioning to audio format only (telephone).  All issues noted in this document were discussed and addressed.  No physical exam could be performed with this format.  Please refer to the patient's chart for his  consent to telehealth for Daniel Fields.   Date:  09/30/2019   ID:  Daniel Fields, DOB 05-22-1956, MRN [id number]  Patient Location: Home Provider Location: Home  PCP:  Celene Squibb, MD  Cardiologist:  Rozann Lesches, MD Electrophysiologist:  None   Evaluation Performed:  Follow-Up Visit  Chief Complaint:   Cardiac follow-up  History of Present Illness:    Daniel Fields is a 63 y.o. male that I have met back in September 2019.  We spoke by phone today.  He tells me that he has not experienced any significant angina symptoms, no nitroglycerin use.  He has been having problems with back pain, currently seeing a surgeon and had a steroid injection.  He has transitioned primary care to Dr. Nevada Crane in light of Dr. Luan Pulling' pending retirement.  Follow-up Lexiscan Myoview in October last year was low risk as outlined below.  I reviewed the results with him today.  We also went over his medications which are outlined below.  He does need a fresh bottle of nitroglycerin.  He states that he has been compliant with Lipitor and should be having follow-up blood work with PCP in the next month.  The patient does not have symptoms concerning for  COVID-19 infection (fever, chills, cough, or new shortness of breath).    Past Medical History:  Diagnosis Date  . Arthritis   . Blurred vision   . CAD (coronary artery disease)    Stent to circumflex and RCA 2004  . Chronic back pain   . COPD (chronic obstructive pulmonary disease) ([place])   . Depression   . Gout   . Hypertension   . IBS (irritable bowel syndrome)   . Mixed hyperlipidemia   . Sleep apnea    CPAP  . Testicular cancer [facility])    Status post resection and chemotherapy, age early 58s   Past Surgical History:  Procedure Laterality Date  . CARDIAC CATHETERIZATION  2009  . CHOLECYSTECTOMY    . COLONOSCOPY WITH ESOPHAGOGASTRODUODENOSCOPY (EGD) N/A 07/29/2013   Procedure: COLONOSCOPY WITH ESOPHAGOGASTRODUODENOSCOPY (EGD);  Surgeon: Daneil Dolin, MD;  [facility];  Service: Endoscopy;  Laterality: N/A;  12:45  . CORONARY ANGIOPLASTY WITH STENT PLACEMENT  2004   3 total stents  . FOOT SURGERY Right   . SURGERY SCROTAL / TESTICULAR     right testicular cancer     Current Meds  Medication Sig  . allopurinol (ZYLOPRIM) 300 MG tablet Take 300 mg by mouth at bedtime.   Marland Kitchen aspirin EC 81 MG tablet Take 81 mg by mouth daily.  Marland Kitchen atorvastatin (LIPITOR) 10 MG tablet Take 1 tablet by mouth at bedtime.   Marland Kitchen diltiazem (CARDIZEM CD) 180 MG 24 hr capsule Take 180 mg by  mouth at bedtime.   . folic acid (FOLVITE) 1 MG tablet Take 1 mg by mouth at bedtime.   Marland Kitchen HYDROcodone-acetaminophen (NORCO) 7.5-325 MG per tablet 1 tablet every 6 (six) hours as needed for pain.   . methocarbamol (ROBAXIN) 500 MG tablet Take 1 tablet (500 mg total) by mouth 3 (three) times daily.  . metoprolol succinate (TOPROL-XL) 50 MG 24 hr tablet TAKE 1 TABLET BY MOUTH EVERY DAY. PLEASE MAKE APPT FOR REFILLS.  Marland Kitchen nitroGLYCERIN (NITROSTAT) 0.4 MG SL tablet Place 1 tablet (0.4 mg total) under the tongue every 5 (five) minutes as needed.  . pantoprazole (PROTONIX) 40 MG tablet Take 40 mg by mouth at bedtime.    . potassium chloride SA (K-DUR,KLOR-CON) 20 MEQ tablet Take 20 mEq by mouth at bedtime.   . ramipril (ALTACE) 10 MG capsule Take 10 mg by mouth at bedtime. No further refills without an appointment.  . [DISCONTINUED] nitroGLYCERIN (NITROSTAT) 0.4 MG SL tablet Place 1 tablet (0.4 mg total) under the tongue every 5 (five) minutes as needed.     Allergies:   Patient has no known allergies.   Social History   Tobacco Use  . Smoking status: Current Every Day Smoker    Packs/day: 1.50    Years: 40.00    Pack years: 60.00    Types: Cigarettes  . Smokeless tobacco: Never Used  Substance Use Topics  . Alcohol use: No    Alcohol/week: 0.0 standard drinks  . Drug use: No     Family Hx: The patient's family history includes Breast cancer in his cousin; CAD in his mother; Diabetes in his brother and mother; Fibromyalgia in his mother; Heart disease in his father; Hypertension in his father; Kidney failure in his brother; Stroke in his father. There is no history of Colon cancer, Crohn's disease, or Ulcerative colitis.  ROS:   Please see the history of present illness.    Back pain All other systems reviewed and are negative.   Prior CV studies:   The following studies were reviewed today:  Cardiac catheterization 03/08/2008: Left main normal, LAD normal, widely patent stent site and midportion of circumflex, widely patent stent site and midportion of RCA, left ventriculography with LVEF 60% and no wall motion abnormality.  Lexiscan Myoview 09/10/2018:  No diagnostic ST segment changes to indicate ischemia.  Small, mild intensity, apical to basal inferior defect that is fixed and consistent with soft tissue attenuation given normal wall motion. No large ischemic territories noted.  This is a low risk study.  Nuclear stress EF: 55%.  Labs/Other Tests and Data Reviewed:    EKG:  An ECG dated 06/07/2018 was personally reviewed today and demonstrated:  Sinus rhythm.  Recent Labs:   No interval lab work for review.  Wt Readings from Last 3 Encounters:  09/30/19 218 lb (98.9 kg)  08/24/18 200 lb (90.7 kg)  05/06/16 217 lb (98.4 kg)     Objective:    Vital Signs:  Ht 5' 11"  (1.803 m)   Wt 218 lb (98.9 kg)   BMI 30.40 kg/m    He did not have a way to check vital signs today. Patient spoke in full sentences, not short of breath. No audible wheezing or coughing. Speech pattern normal.  ASSESSMENT & PLAN:    1.  CAD status post previous stent intervention to the circumflex and RCA as discussed above.  He is clinically stable without active angina on medical therapy.  Refill being provided for fresh bottle of  nitroglycerin.  He underwent a low risk Lexiscan Myoview last year for surveillance.  Continue aspirin, statin, beta-blocker, and ACE inhibitor.  2.  Mixed hyperlipidemia, he remains on Lipitor.  He states that he is due for follow-up lipid panel with his new PCP Dr. Nevada Crane.  Would aim for LDL under 70.  3.  Tobacco abuse, smoking cessation has been discussed over time.  COVID-19 Education: The signs and symptoms of COVID-19 were discussed with the patient and how to seek care for testing (follow up with PCP or arrange E-visit).  The importance of social distancing was discussed today.  Time:   Today, I have spent 7 minutes with the patient with telehealth technology discussing the above problems.     Medication Adjustments/Labs and Tests Ordered: Current medicines are reviewed at length with the patient today.  Concerns regarding medicines are outlined above.   Tests Ordered: No orders of the defined types were placed in this encounter.   Medication Changes: Meds ordered this encounter  Medications  . nitroGLYCERIN (NITROSTAT) 0.4 MG SL tablet    Sig: Place 1 tablet (0.4 mg total) under the tongue every 5 (five) minutes as needed.    Dispense:  25 tablet    Refill:  3    Follow Up:  In Person 1 year in the [place] office.  Signed, Rozann Lesches, MD  09/30/2019 10:06 AM    [facility]

## 2019-09-30 NOTE — Patient Instructions (Signed)
Medication Instructions:  Your physician recommends that you continue on your current medications as directed. Please refer to the Current Medication list given to you today.  *If you need a refill on your cardiac medications before your next appointment, please call your pharmacy*  Lab Work: None today If you have labs (blood work) drawn today and your tests are completely normal, you will receive your results only by: . MyChart Message (if you have MyChart) OR . A paper copy in the mail If you have any lab test that is abnormal or we need to change your treatment, we will call you to review the results.  Testing/Procedures: None today  Follow-Up: At [facility], you and your health needs are our priority.  As part of our continuing mission to provide you with exceptional heart care, we have created designated Provider Care Teams.  These Care Teams include your primary Cardiologist (physician) and Advanced Practice Providers (APPs -  Physician Assistants and Nurse Practitioners) who all work together to provide you with the care you need, when you need it.  Your next appointment:   12 month(s)  The format for your next appointment:   In Person  Provider:   Samuel McDowell, MD  Other Instructions None     Thank you for choosing [facility] !        

## 2019-10-03 ENCOUNTER — Telehealth: Payer: Self-pay | Admitting: Licensed Clinical Social Worker

## 2019-10-03 NOTE — Telephone Encounter (Signed)
CSW referred to assist patient with obtaining a BP cuff. CSW contacted patient to inform cuff will be delivered to home. Patient grateful for support and assistance. CSW available as needed. Jackie Carlissa Pesola, LCSW, CCSW-MCS [phone]  

## 2019-10-13 DIAGNOSIS — Z125 Encounter for screening for malignant neoplasm of prostate: Secondary | ICD-10-CM | POA: Diagnosis not present

## 2019-10-13 DIAGNOSIS — I10 Essential (primary) hypertension: Secondary | ICD-10-CM | POA: Diagnosis not present

## 2019-10-13 DIAGNOSIS — I480 Paroxysmal atrial fibrillation: Secondary | ICD-10-CM | POA: Diagnosis not present

## 2019-10-13 DIAGNOSIS — E876 Hypokalemia: Secondary | ICD-10-CM | POA: Diagnosis not present

## 2019-10-19 DIAGNOSIS — I480 Paroxysmal atrial fibrillation: Secondary | ICD-10-CM | POA: Diagnosis not present

## 2019-10-19 DIAGNOSIS — M545 Low back pain: Secondary | ICD-10-CM | POA: Diagnosis not present

## 2019-10-19 DIAGNOSIS — D696 Thrombocytopenia, unspecified: Secondary | ICD-10-CM | POA: Diagnosis not present

## 2019-10-19 DIAGNOSIS — I10 Essential (primary) hypertension: Secondary | ICD-10-CM | POA: Diagnosis not present

## 2019-12-14 ENCOUNTER — Ambulatory Visit (HOSPITAL_COMMUNITY): Payer: Medicare Other

## 2020-02-22 ENCOUNTER — Encounter: Payer: Self-pay | Admitting: Orthopedic Surgery

## 2020-02-22 ENCOUNTER — Ambulatory Visit: Payer: Medicare Other

## 2020-02-22 ENCOUNTER — Other Ambulatory Visit: Payer: Self-pay

## 2020-02-22 ENCOUNTER — Ambulatory Visit: Payer: Medicare Other | Admitting: Orthopedic Surgery

## 2020-02-22 DIAGNOSIS — M5410 Radiculopathy, site unspecified: Secondary | ICD-10-CM

## 2020-02-22 DIAGNOSIS — M25552 Pain in left hip: Secondary | ICD-10-CM

## 2020-02-22 DIAGNOSIS — M7062 Trochanteric bursitis, left hip: Secondary | ICD-10-CM
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
## 2020-06-08 ENCOUNTER — Encounter (HOSPITAL_COMMUNITY): Payer: Self-pay

## 2020-06-08 ENCOUNTER — Emergency Department (HOSPITAL_COMMUNITY)
Admission: EM | Admit: 2020-06-08 | Discharge: 2020-06-08 | Disposition: A | Discharge status: Home / Self Care | Payer: Medicare Other | Attending: Emergency Medicine | Admitting: Emergency Medicine

## 2020-06-08 ENCOUNTER — Emergency Department (HOSPITAL_COMMUNITY): Payer: Medicare Other

## 2020-06-08 ENCOUNTER — Other Ambulatory Visit: Payer: Self-pay

## 2020-06-08 DIAGNOSIS — I2510 Atherosclerotic heart disease of native coronary artery without angina pectoris: Secondary | ICD-10-CM | POA: Diagnosis not present

## 2020-06-08 DIAGNOSIS — I10 Essential (primary) hypertension: Secondary | ICD-10-CM | POA: Diagnosis not present

## 2020-06-08 DIAGNOSIS — Z79899 Other long term (current) drug therapy: Secondary | ICD-10-CM | POA: Insufficient documentation

## 2020-06-08 DIAGNOSIS — Y92410 Unspecified street and highway as the place of occurrence of the external cause: Secondary | ICD-10-CM | POA: Insufficient documentation

## 2020-06-08 DIAGNOSIS — F17210 Nicotine dependence, cigarettes, uncomplicated: Secondary | ICD-10-CM | POA: Insufficient documentation

## 2020-06-08 DIAGNOSIS — Z8547 Personal history of malignant neoplasm of testis: Secondary | ICD-10-CM | POA: Insufficient documentation

## 2020-06-08 DIAGNOSIS — Y999 Unspecified external cause status: Secondary | ICD-10-CM | POA: Diagnosis not present

## 2020-06-08 DIAGNOSIS — Y9389 Activity, other specified: Secondary | ICD-10-CM | POA: Diagnosis not present

## 2020-06-08 DIAGNOSIS — S161XXA Strain of muscle, fascia and tendon at neck level, initial encounter: Secondary | ICD-10-CM | POA: Diagnosis not present

## 2020-06-08 DIAGNOSIS — R519 Headache, unspecified: Secondary | ICD-10-CM | POA: Insufficient documentation

## 2020-06-08 DIAGNOSIS — Z7982 Long term (current) use of aspirin: Secondary | ICD-10-CM | POA: Insufficient documentation

## 2020-06-08 DIAGNOSIS — J449 Chronic obstructive pulmonary disease, unspecified: Secondary | ICD-10-CM | POA: Diagnosis not present

## 2020-06-08 DIAGNOSIS — S199XXA Unspecified injury of neck, initial encounter: Secondary | ICD-10-CM | POA: Diagnosis present
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
## 2020-06-08 NOTE — Discharge Instructions (Addendum)
I recommend a combination of tylenol and ibuprofen for management of your pain. You can take a low dose of both at the same time. I recommend 325 mg of Tylenol combined with 400 mg of ibuprofen. This is one regular Tylenol and two regular ibuprofen. You can take these 2-3 times for day for your pain. Please try to take these medications with a small amount of food as well to prevent upsetting your stomach.  Also, please consider topical pain relieving creams such as Voltaran Gel, BioFreeze, or Icy Hot. There is also a pain relieving cream made by [employer]. You should be able to find all of these at your local pharmacy.   Please adhere to the "RICE Method" for rehabbing. This stands for "rest, ice, compress, and elevate". Please continue to perform stretches and exercise as much as your pain allows. It is important that you keep moving to help heal your injury.   Please return to the emergency department with any new or worsening symptoms.  If your symptoms have not improved in 1 week, please follow-up with your primary care provider.  It was a pleasure to meet you.
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
## 2020-06-08 NOTE — ED Notes (Signed)
Pt ambulatory to CT

## 2020-06-12 ENCOUNTER — Emergency Department (HOSPITAL_COMMUNITY): Payer: Medicare Other

## 2020-06-12 ENCOUNTER — Encounter (HOSPITAL_COMMUNITY): Payer: Self-pay

## 2020-06-12 ENCOUNTER — Other Ambulatory Visit: Payer: Self-pay

## 2020-06-12 ENCOUNTER — Emergency Department (HOSPITAL_COMMUNITY)
Admission: EM | Admit: 2020-06-12 | Discharge: 2020-06-12 | Disposition: A | Discharge status: Home / Self Care | Payer: Medicare Other | Attending: Emergency Medicine | Admitting: Emergency Medicine

## 2020-06-12 DIAGNOSIS — Y939 Activity, unspecified: Secondary | ICD-10-CM | POA: Insufficient documentation

## 2020-06-12 DIAGNOSIS — I2510 Atherosclerotic heart disease of native coronary artery without angina pectoris: Secondary | ICD-10-CM | POA: Diagnosis not present

## 2020-06-12 DIAGNOSIS — Y999 Unspecified external cause status: Secondary | ICD-10-CM | POA: Diagnosis not present

## 2020-06-12 DIAGNOSIS — Y929 Unspecified place or not applicable: Secondary | ICD-10-CM | POA: Insufficient documentation

## 2020-06-12 DIAGNOSIS — F17210 Nicotine dependence, cigarettes, uncomplicated: Secondary | ICD-10-CM | POA: Diagnosis not present

## 2020-06-12 DIAGNOSIS — I119 Hypertensive heart disease without heart failure: Secondary | ICD-10-CM | POA: Diagnosis not present

## 2020-06-12 DIAGNOSIS — J449 Chronic obstructive pulmonary disease, unspecified: Secondary | ICD-10-CM | POA: Insufficient documentation

## 2020-06-12 DIAGNOSIS — S6992XA Unspecified injury of left wrist, hand and finger(s), initial encounter: Secondary | ICD-10-CM

## 2020-06-12 DIAGNOSIS — Z79899 Other long term (current) drug therapy: Secondary | ICD-10-CM | POA: Insufficient documentation

## 2020-06-12 DIAGNOSIS — S60922A Unspecified superficial injury of left hand, initial encounter: Secondary | ICD-10-CM | POA: Diagnosis present

## 2020-06-12 DIAGNOSIS — S61432A Puncture wound without foreign body of left hand, initial encounter: Secondary | ICD-10-CM

## 2020-06-12 DIAGNOSIS — S61442A Puncture wound with foreign body of left hand, initial encounter: Secondary | ICD-10-CM | POA: Insufficient documentation

## 2020-06-12 MED ORDER — DOXYCYCLINE HYCLATE 100 MG PO CAPS: 100.0000 mg | ORAL_CAPSULE | Freq: Two times a day (BID) | ORAL | 0 refills | Status: DC
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
## 2020-07-30 ENCOUNTER — Other Ambulatory Visit: Payer: Self-pay

## 2020-07-30 ENCOUNTER — Emergency Department (HOSPITAL_COMMUNITY)
Admission: EM | Admit: 2020-07-30 | Discharge: 2020-07-30 | Disposition: A | Discharge status: Home / Self Care | Payer: No Typology Code available for payment source | Attending: Emergency Medicine | Admitting: Emergency Medicine

## 2020-07-30 ENCOUNTER — Encounter (HOSPITAL_COMMUNITY): Payer: Self-pay | Admitting: *Deleted

## 2020-07-30 ENCOUNTER — Emergency Department (HOSPITAL_COMMUNITY): Payer: No Typology Code available for payment source

## 2020-07-30 DIAGNOSIS — F17210 Nicotine dependence, cigarettes, uncomplicated: Secondary | ICD-10-CM | POA: Diagnosis not present

## 2020-07-30 DIAGNOSIS — Y999 Unspecified external cause status: Secondary | ICD-10-CM | POA: Insufficient documentation

## 2020-07-30 DIAGNOSIS — S46012A Strain of muscle(s) and tendon(s) of the rotator cuff of left shoulder, initial encounter: Secondary | ICD-10-CM | POA: Insufficient documentation

## 2020-07-30 DIAGNOSIS — I119 Hypertensive heart disease without heart failure: Secondary | ICD-10-CM | POA: Insufficient documentation

## 2020-07-30 DIAGNOSIS — M542 Cervicalgia: Secondary | ICD-10-CM | POA: Insufficient documentation

## 2020-07-30 DIAGNOSIS — Z79899 Other long term (current) drug therapy: Secondary | ICD-10-CM | POA: Diagnosis not present

## 2020-07-30 DIAGNOSIS — J449 Chronic obstructive pulmonary disease, unspecified: Secondary | ICD-10-CM | POA: Insufficient documentation

## 2020-07-30 DIAGNOSIS — Y939 Activity, unspecified: Secondary | ICD-10-CM | POA: Insufficient documentation

## 2020-07-30 DIAGNOSIS — Y929 Unspecified place or not applicable: Secondary | ICD-10-CM | POA: Insufficient documentation

## 2020-07-30 DIAGNOSIS — Z7982 Long term (current) use of aspirin: Secondary | ICD-10-CM | POA: Insufficient documentation

## 2020-07-30 DIAGNOSIS — S46912A Strain of unspecified muscle, fascia and tendon at shoulder and upper arm level, left arm, initial encounter: Secondary | ICD-10-CM

## 2020-07-30 DIAGNOSIS — Z955 Presence of coronary angioplasty implant and graft: Secondary | ICD-10-CM | POA: Insufficient documentation

## 2020-07-30 DIAGNOSIS — S4992XA Unspecified injury of left shoulder and upper arm, initial encounter: Secondary | ICD-10-CM | POA: Diagnosis present

## 2020-07-30 MED ORDER — METHOCARBAMOL 500 MG PO TABS: 500.0000 mg | ORAL_TABLET | Freq: Three times a day (TID) | ORAL | 0 refills | Status: DC
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
## 2020-07-30 NOTE — Discharge Instructions (Addendum)
Apply ice packs on and off to your shoulder and neck.  Avoid heavy lifting or straining.  Follow-up with your primary doctor for recheck or contact the orthopedic provider listed in 1 week if your shoulder pain is not improving.  Your x-ray today did not show evidence of a dislocation or bony injury.  Also, return to the emergency department if you develop any severe, sudden onset of headache, persistent vomiting, or numbness of your left arm or hand.

## 2020-08-02 ENCOUNTER — Telehealth: Payer: Self-pay | Admitting: Orthopedic Surgery
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
## 2021-03-24 NOTE — Progress Notes (Signed)
Cardiology Office Note  Date: 03/25/2021   ID: Daniel Fields, DOB 09-Sep-1956, MRN [id number]  PCP:  Celene Squibb, MD  Cardiologist:  Rozann Lesches, MD Electrophysiologist:  None   Chief Complaint  Patient presents with  . Cardiac follow-up    History of Present Illness: Daniel Fields is a 65 y.o. male last assessed via telehealth encounter in October 2020.  He presents overdue for follow-up.  He states that he has been following with Dr. Nevada Crane in the meanwhile, no accelerating angina symptoms.  He has used 2 nitroglycerin tablets in the last year, needs a refill for fresh bottle.  I reviewed his recent lab work as noted below.  He was just placed on HCTZ for better blood pressure control and has follow-up with Dr. Nevada Crane. I reviewed the remainder of his medications.  He did discontinue Toprol-XL.  I personally reviewed his ECG today which shows normal sinus rhythm.  Past Medical History:  Diagnosis Date  . Arthritis   . Blurred vision   . CAD (coronary artery disease)    Stent to circumflex and RCA 2004  . Chronic back pain   . COPD (chronic obstructive pulmonary disease) ([place])   . Depression   . Gout   . Hypertension   . IBS (irritable bowel syndrome)   . Mixed hyperlipidemia   . Sleep apnea    CPAP  . Testicular cancer [facility])    Status post resection and chemotherapy, age early 17s    Past Surgical History:  Procedure Laterality Date  . CARDIAC CATHETERIZATION  2009  . CHOLECYSTECTOMY    . COLONOSCOPY WITH ESOPHAGOGASTRODUODENOSCOPY (EGD) N/A 07/29/2013   Procedure: COLONOSCOPY WITH ESOPHAGOGASTRODUODENOSCOPY (EGD);  Surgeon: Daneil Dolin, MD;  [facility];  Service: Endoscopy;  Laterality: N/A;  12:45  . CORONARY ANGIOPLASTY WITH STENT PLACEMENT  2004   3 total stents  . FOOT SURGERY Right   . SURGERY SCROTAL / TESTICULAR     right testicular cancer    Current Outpatient Medications  Medication Sig Dispense Refill  . allopurinol (ZYLOPRIM)  300 MG tablet Take 300 mg by mouth at bedtime.    Marland Kitchen aspirin EC 81 MG tablet Take 81 mg by mouth daily.    Marland Kitchen atorvastatin (LIPITOR) 10 MG tablet Take 1 tablet by mouth at bedtime.     Marland Kitchen diltiazem (CARDIZEM CD) 180 MG 24 hr capsule Take 180 mg by mouth at bedtime.    . folic acid (FOLVITE) 1 MG tablet Take 1 mg by mouth at bedtime.    . hydrochlorothiazide (HYDRODIURIL) 25 MG tablet Take 1 tablet by mouth daily.    . pantoprazole (PROTONIX) 40 MG tablet Take 40 mg by mouth at bedtime.    . potassium chloride SA (K-DUR,KLOR-CON) 20 MEQ tablet Take 20 mEq by mouth at bedtime.    . ramipril (ALTACE) 10 MG capsule Take 10 mg by mouth at bedtime. No further refills without an appointment.    . nitroGLYCERIN (NITROSTAT) 0.4 MG SL tablet Place 1 tablet (0.4 mg total) under the tongue every 5 (five) minutes as needed. 25 tablet 3   No current facility-administered medications for this visit.   Allergies:  Patient has no known allergies.   ROS: No syncope.  Physical Exam: VS:  BP (!) 148/80   Pulse 83   Ht 5\' 10"  (1.778 m)   Wt 215 lb 9.6 oz (97.8 kg)   SpO2 99%   BMI 30.94 kg/m , BMI Body  mass index is 30.94 kg/m.  Wt Readings from Last 3 Encounters:  03/25/21 215 lb 9.6 oz (97.8 kg)  06/12/20 205 lb (93 kg)  06/08/20 205 lb (93 kg)    General: Patient appears comfortable at rest. HEENT: Conjunctiva and lids normal, wearing a mask. Neck: Supple, no elevated JVP or carotid bruits, no thyromegaly. Lungs: Clear to auscultation, nonlabored breathing at rest. Cardiac: Regular rate and rhythm, no S3 or significant systolic murmur, no pericardial rub. Extremities: No pitting edema.  ECG:  An ECG dated 06/07/2018 was personally reviewed today and demonstrated:  Sinus rhythm.  Recent Labwork:  April 2022: Hemoglobin 16.5, platelets 130, BUN 11, creatinine 1.11, potassium 4.3, AST 20, ALT 30, cholesterol 120, triglycerides 114, HDL 26, LDL 73, hemoglobin A1c 5.4%  Other Studies Reviewed  Today:  Lexiscan Myoview 09/10/2018:  No diagnostic ST segment changes to indicate ischemia.  Small, mild intensity, apical to basal inferior defect that is fixed and consistent with soft tissue attenuation given normal wall motion. No large ischemic territories noted.  This is a low risk study.  Nuclear stress EF: 55%.  Assessment and Plan:  1.  CAD status post stent intervention to the circumflex and RCA in 2004.  He does not report any progressive angina on medical therapy and we will continue with observation for now.  ECG reviewed and normal today.  Continue aspirin, Cardizem CD, Lipitor, and Altace.  Refill provided for fresh bottle of nitroglycerin.  2.  Essential hypertension, HCTZ recently started by PCP.  Continue to track blood pressure.  He is further room for progression of Cardizem CD if needed.  3.  Mixed hyperlipidemia, tolerating Lipitor.  Recent LDL 73.  Medication Adjustments/Labs and Tests Ordered: Current medicines are reviewed at length with the patient today.  Concerns regarding medicines are outlined above.   Tests Ordered: Orders Placed This Encounter  Procedures  . EKG 12-Lead    Medication Changes: Meds ordered this encounter  Medications  . nitroGLYCERIN (NITROSTAT) 0.4 MG SL tablet    Sig: Place 1 tablet (0.4 mg total) under the tongue every 5 (five) minutes as needed.    Dispense:  25 tablet    Refill:  3    Disposition:  Follow up 1 year.  Signed, Satira Sark, MD, [facility] 03/25/2021 10:03 AM    [place] [facility] [place]. [address], [place], [place] [address] Phone: [phone]; Fax: [phone]

## 2021-03-25 ENCOUNTER — Encounter: Payer: Self-pay | Admitting: Cardiology

## 2021-03-25 ENCOUNTER — Other Ambulatory Visit: Payer: Self-pay

## 2021-03-25 ENCOUNTER — Ambulatory Visit (INDEPENDENT_AMBULATORY_CARE_PROVIDER_SITE_OTHER): Payer: Medicare Other | Admitting: Cardiology

## 2021-03-25 VITALS — BP 148/80 | HR 83 | Ht 70.0 in | Wt 215.6 lb

## 2021-03-25 DIAGNOSIS — E782 Mixed hyperlipidemia: Secondary | ICD-10-CM

## 2021-03-25 DIAGNOSIS — I10 Essential (primary) hypertension: Secondary | ICD-10-CM | POA: Diagnosis not present

## 2021-03-25 DIAGNOSIS — I25119 Atherosclerotic heart disease of native coronary artery with unspecified angina pectoris: Secondary | ICD-10-CM

## 2021-03-25 MED ORDER — NITROGLYCERIN 0.4 MG SL SUBL: 0.4000 mg | SUBLINGUAL_TABLET | SUBLINGUAL | 3 refills | Status: DC | PRN

## 2021-03-25 NOTE — Patient Instructions (Signed)
Medication Instructions:  Your physician recommends that you continue on your current medications as directed. Please refer to the Current Medication list given to you today.  I refilled your Nitroglycerin  *If you need a refill on your cardiac medications before your next appointment, please call your pharmacy*   Lab Work: None today  If you have labs (blood work) drawn today and your tests are completely normal, you will receive your results only by: Marland Kitchen MyChart Message (if you have MyChart) OR . A paper copy in the mail If you have any lab test that is abnormal or we need to change your treatment, we will call you to review the results.   Testing/Procedures: None today   Follow-Up: At [facility], you and your health needs are our priority.  As part of our continuing mission to provide you with exceptional heart care, we have created designated Provider Care Teams.  These Care Teams include your primary Cardiologist (physician) and Advanced Practice Providers (APPs -  Physician Assistants and Nurse Practitioners) who all work together to provide you with the care you need, when you need it.  We recommend signing up for the patient portal called "MyChart".  Sign up information is provided on this After Visit Summary.  MyChart is used to connect with patients for Virtual Visits (Telemedicine).  Patients are able to view lab/test results, encounter notes, upcoming appointments, etc.  Non-urgent messages can be sent to your provider as well.   To learn more about what you can do with MyChart, go to [email or web address].    Your next appointment:   12 month(s)  The format for your next appointment:   In Person  Provider:   Rozann Lesches, MD   Other Instructions None

## 2021-06-21 DIAGNOSIS — I10 Essential (primary) hypertension: Secondary | ICD-10-CM | POA: Diagnosis not present

## 2021-06-21 DIAGNOSIS — R972 Elevated prostate specific antigen [PSA]: Secondary | ICD-10-CM | POA: Diagnosis not present

## 2021-06-21 DIAGNOSIS — R7301 Impaired fasting glucose: Secondary | ICD-10-CM | POA: Diagnosis not present

## 2021-06-25 DIAGNOSIS — I480 Paroxysmal atrial fibrillation: Secondary | ICD-10-CM | POA: Diagnosis not present

## 2021-06-25 DIAGNOSIS — K219 Gastro-esophageal reflux disease without esophagitis: Secondary | ICD-10-CM | POA: Diagnosis not present

## 2021-06-25 DIAGNOSIS — E782 Mixed hyperlipidemia: Secondary | ICD-10-CM | POA: Diagnosis not present

## 2021-06-25 DIAGNOSIS — I10 Essential (primary) hypertension: Secondary | ICD-10-CM | POA: Diagnosis not present

## 2021-07-23 IMAGING — CR DG HAND COMPLETE 3+V*L*
3 series · 3 of 3 positions shown · non-contrast
Comparison: None.

CLINICAL DATA: Pain and swelling

EXAM:
LEFT HAND - COMPLETE 3+ VIEW

[x hand pa left]
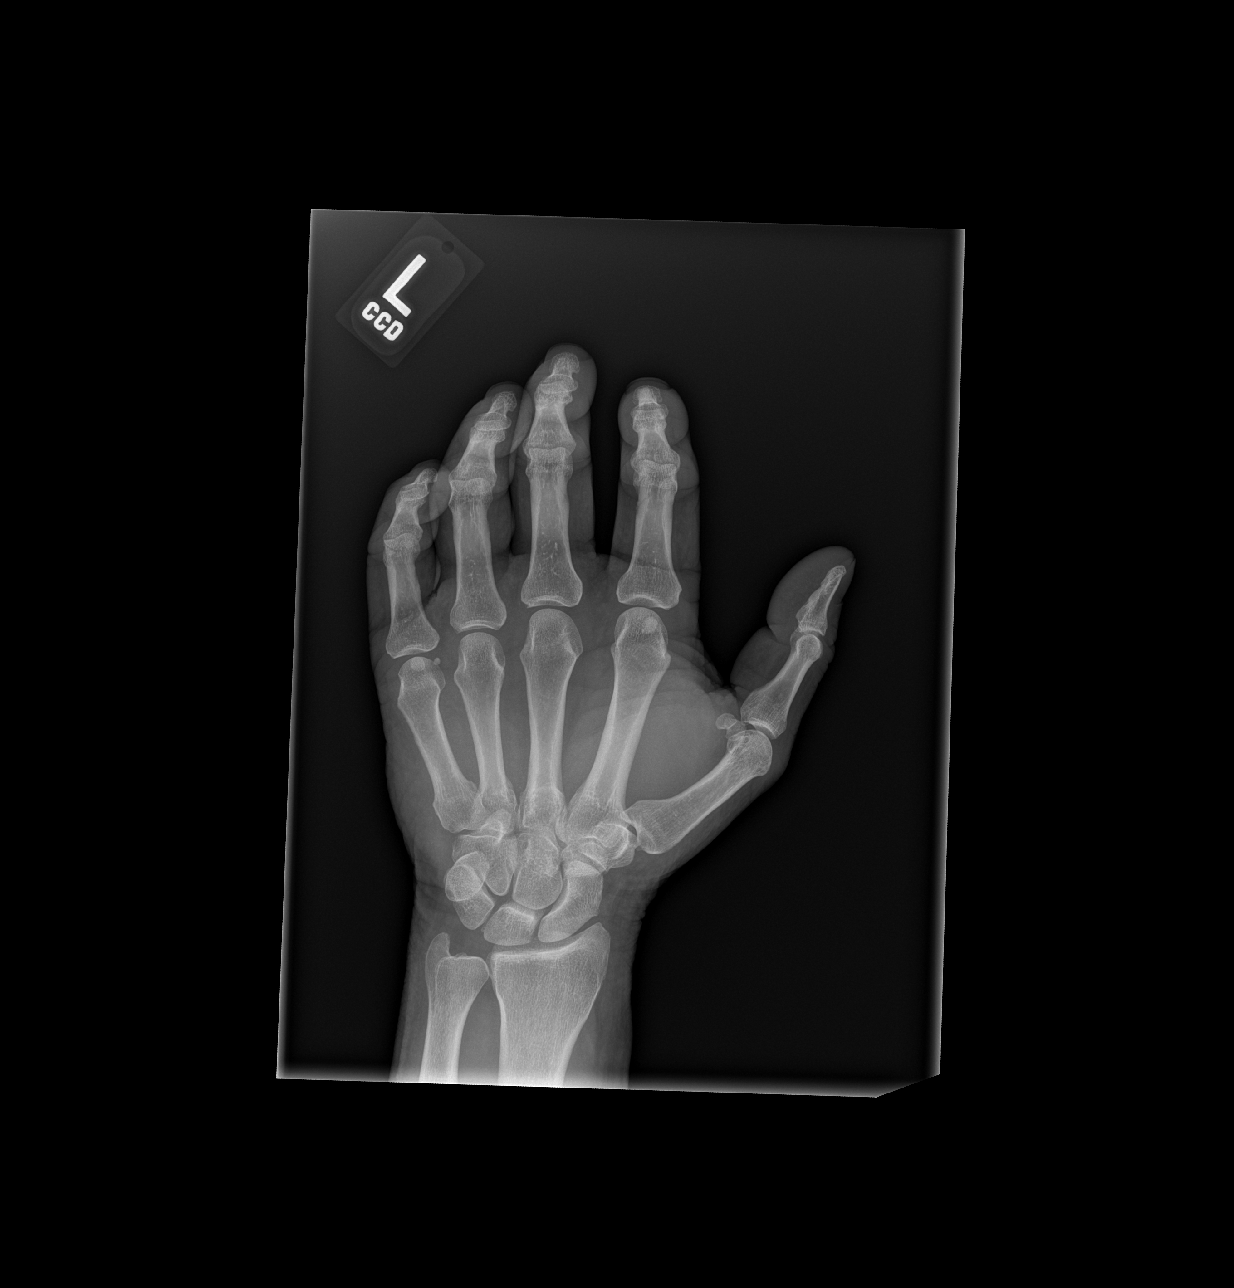

[x hand obl left]
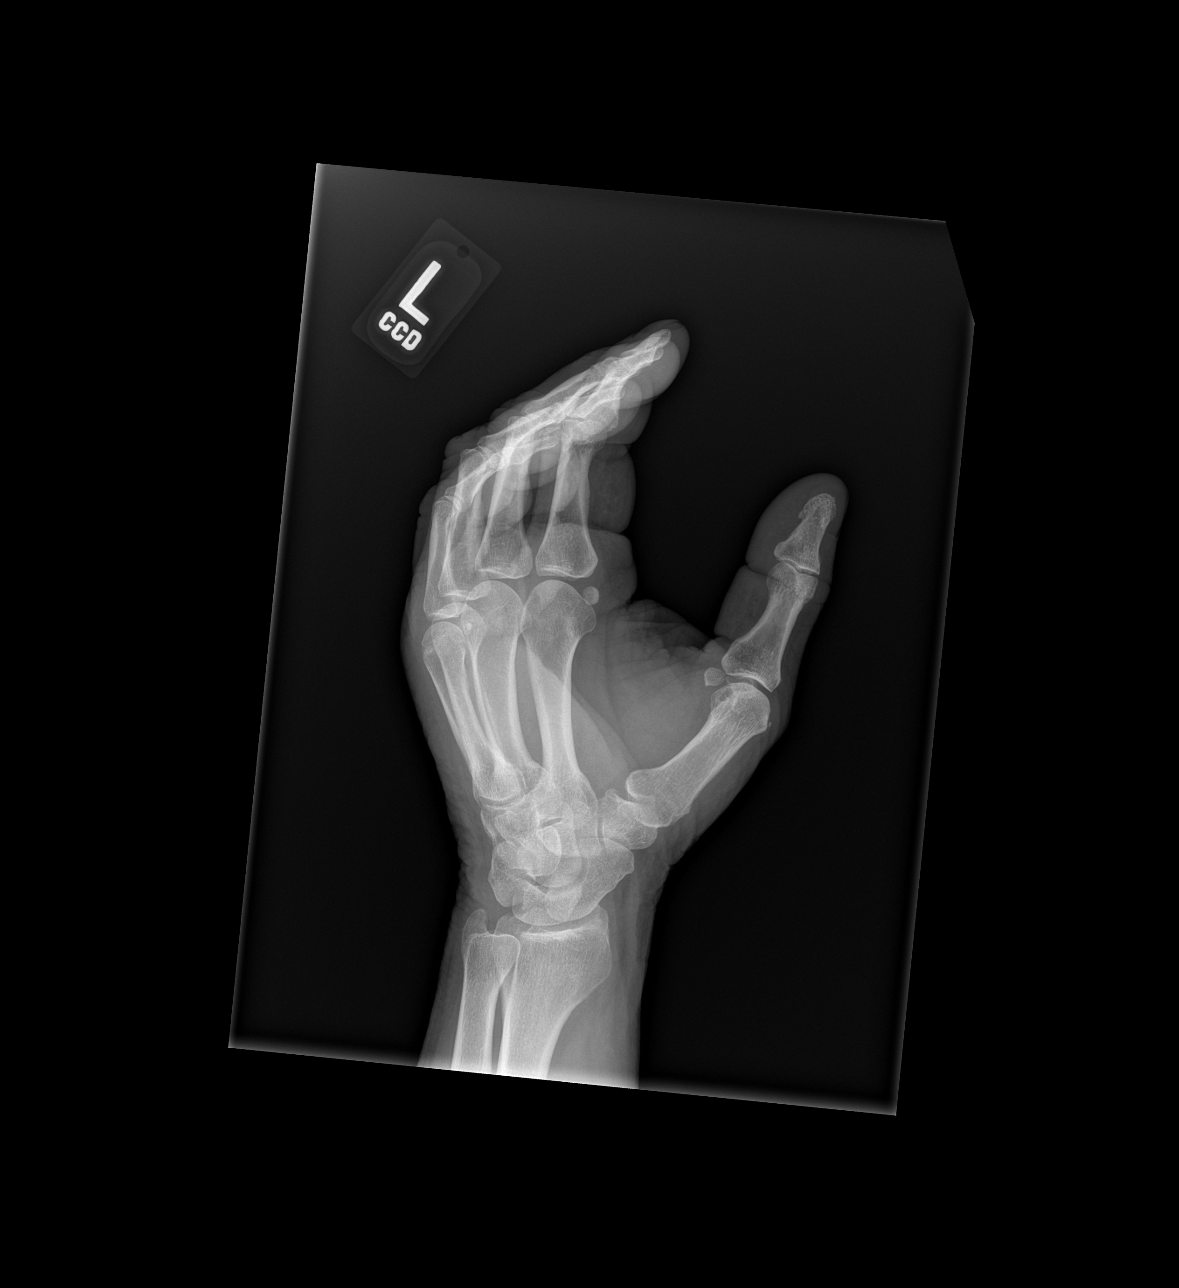

[x hand lat left]
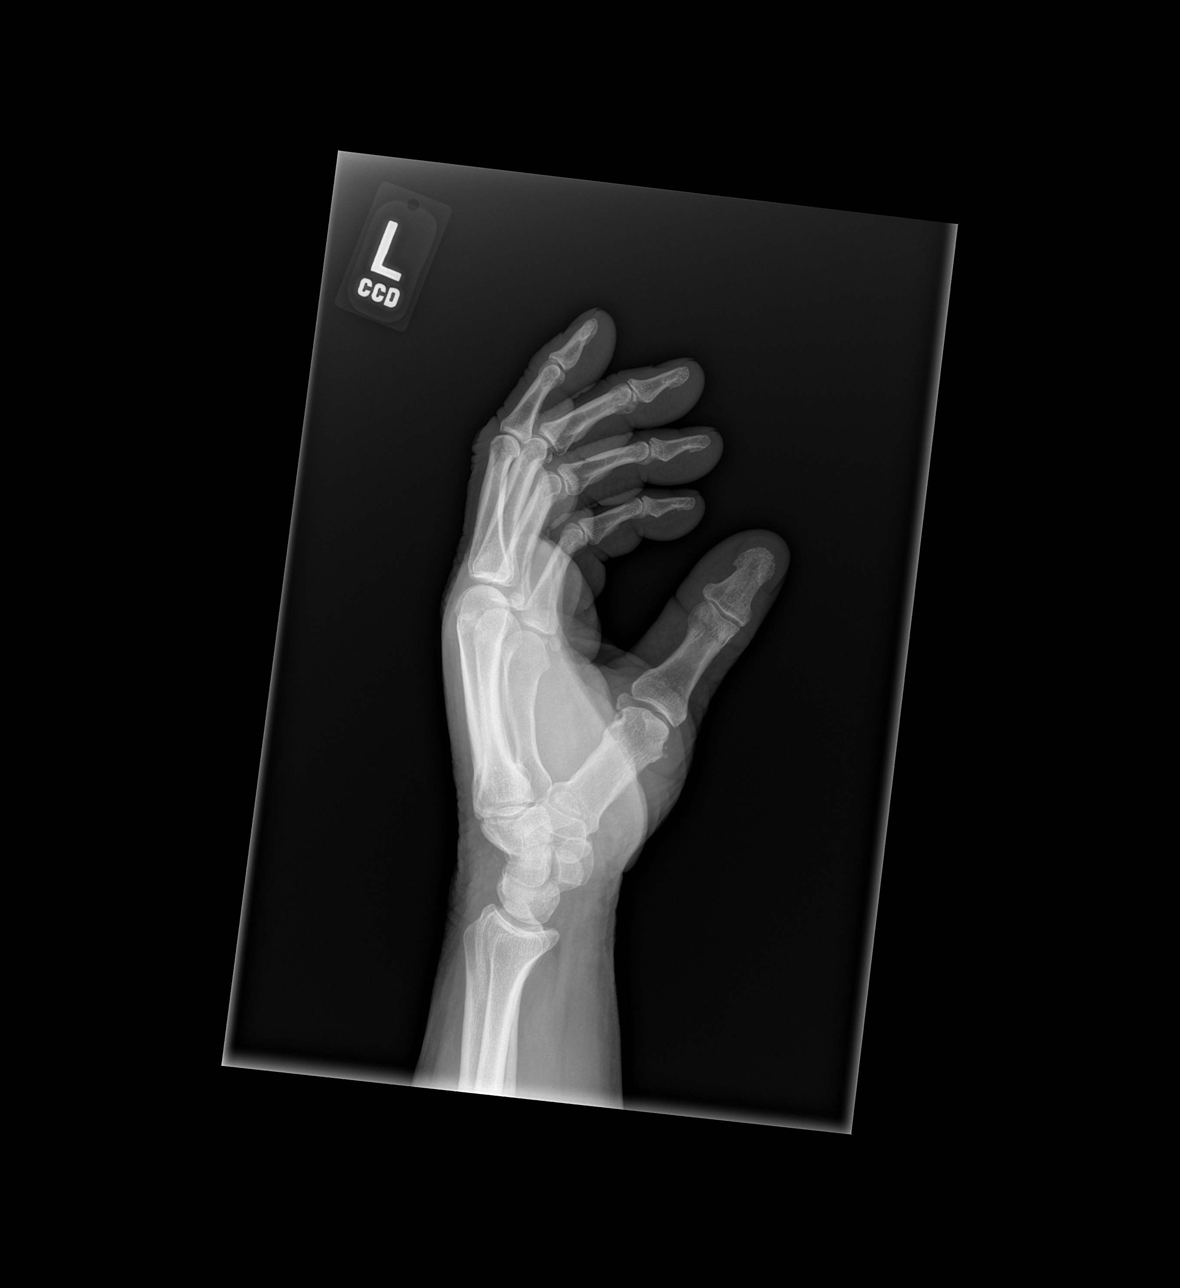

[3 of 3 positions shown; findings below may reference images not displayed]

FINDINGS: Frontal, oblique, and lateral views were obtained. There is mild
generalized soft tissue swelling. No fracture or dislocation. No
appreciable joint space narrowing or erosion. Note that patient is
unable to straighten PIP and DIP joints at this time.
IMPRESSION: Mild generalized soft tissue swelling. No fracture or dislocation.
No joint space narrowing or erosion.

## 2021-09-03 ENCOUNTER — Telehealth: Payer: Self-pay | Admitting: Acute Care

## 2021-09-03 DIAGNOSIS — F17210 Nicotine dependence, cigarettes, uncomplicated: Secondary | ICD-10-CM

## 2021-09-03 NOTE — Telephone Encounter (Signed)
New CT order has been placed.

## 2021-09-03 NOTE — Telephone Encounter (Signed)
Patient last LCS CT was done 11/16/2018 he was scheduled for LCS CT on 12/14/2019 and no showed. I will need new LCS CT order to get the CT scheduled

## 2021-09-04 NOTE — Telephone Encounter (Signed)
I have spoke with Daniel Fields and his LCS CT has been scheduled at [facility] on 10/01/21 @ 8:00am and he is aware

## 2021-10-01 ENCOUNTER — Other Ambulatory Visit: Payer: Self-pay

## 2021-10-01 ENCOUNTER — Ambulatory Visit (HOSPITAL_COMMUNITY)
Admission: RE | Admit: 2021-10-01 | Discharge: 2021-10-01 | Disposition: A | Discharge status: Home / Self Care | Payer: Medicare Other | Source: Ambulatory Visit | Attending: Acute Care | Admitting: Acute Care

## 2021-10-01 DIAGNOSIS — I2510 Atherosclerotic heart disease of native coronary artery without angina pectoris: Secondary | ICD-10-CM | POA: Diagnosis not present

## 2021-10-01 DIAGNOSIS — F17210 Nicotine dependence, cigarettes, uncomplicated: Secondary | ICD-10-CM | POA: Diagnosis not present

## 2021-10-01 DIAGNOSIS — I700 Atherosclerosis of aorta: Secondary | ICD-10-CM | POA: Insufficient documentation

## 2021-10-01 DIAGNOSIS — Z122 Encounter for screening for malignant neoplasm of respiratory organs: Secondary | ICD-10-CM | POA: Insufficient documentation

## 2021-10-01 DIAGNOSIS — J432 Centrilobular emphysema: Secondary | ICD-10-CM | POA: Insufficient documentation

## 2021-10-09 ENCOUNTER — Other Ambulatory Visit: Payer: Self-pay | Admitting: Acute Care

## 2021-10-09 DIAGNOSIS — F17210 Nicotine dependence, cigarettes, uncomplicated: Secondary | ICD-10-CM

## 2021-10-28 DIAGNOSIS — I10 Essential (primary) hypertension: Secondary | ICD-10-CM | POA: Diagnosis not present

## 2021-10-30 DIAGNOSIS — I480 Paroxysmal atrial fibrillation: Secondary | ICD-10-CM | POA: Diagnosis not present

## 2021-10-30 DIAGNOSIS — I10 Essential (primary) hypertension: Secondary | ICD-10-CM | POA: Diagnosis not present

## 2021-10-30 DIAGNOSIS — K219 Gastro-esophageal reflux disease without esophagitis: Secondary | ICD-10-CM | POA: Diagnosis not present

## 2021-10-30 DIAGNOSIS — E782 Mixed hyperlipidemia: Secondary | ICD-10-CM | POA: Diagnosis not present

## 2022-02-06 DIAGNOSIS — I10 Essential (primary) hypertension: Secondary | ICD-10-CM | POA: Diagnosis not present

## 2022-04-25 DIAGNOSIS — E782 Mixed hyperlipidemia: Secondary | ICD-10-CM | POA: Diagnosis not present

## 2022-04-30 DIAGNOSIS — K219 Gastro-esophageal reflux disease without esophagitis: Secondary | ICD-10-CM | POA: Diagnosis not present

## 2022-04-30 DIAGNOSIS — I480 Paroxysmal atrial fibrillation: Secondary | ICD-10-CM | POA: Diagnosis not present

## 2022-04-30 DIAGNOSIS — I10 Essential (primary) hypertension: Secondary | ICD-10-CM | POA: Diagnosis not present

## 2022-04-30 DIAGNOSIS — E782 Mixed hyperlipidemia: Secondary | ICD-10-CM | POA: Diagnosis not present

## 2022-05-07 ENCOUNTER — Other Ambulatory Visit: Payer: Self-pay

## 2022-05-07 ENCOUNTER — Emergency Department (HOSPITAL_COMMUNITY): Payer: Medicare Other

## 2022-05-07 ENCOUNTER — Observation Stay (HOSPITAL_COMMUNITY)
Admission: EM | Admit: 2022-05-07 | Discharge: 2022-05-08 | Disposition: A | Discharge status: Home / Self Care | Payer: Medicare Other | Attending: Cardiovascular Disease | Admitting: Cardiovascular Disease

## 2022-05-07 ENCOUNTER — Encounter (HOSPITAL_COMMUNITY)
Admission: EM | Disposition: A | Discharge status: Home / Self Care | Payer: Self-pay | Source: Home / Self Care | Attending: Emergency Medicine

## 2022-05-07 ENCOUNTER — Encounter (HOSPITAL_COMMUNITY): Payer: Self-pay | Admitting: Emergency Medicine

## 2022-05-07 DIAGNOSIS — J449 Chronic obstructive pulmonary disease, unspecified: Secondary | ICD-10-CM | POA: Insufficient documentation

## 2022-05-07 DIAGNOSIS — Z955 Presence of coronary angioplasty implant and graft: Secondary | ICD-10-CM | POA: Insufficient documentation

## 2022-05-07 DIAGNOSIS — Z79899 Other long term (current) drug therapy: Secondary | ICD-10-CM | POA: Diagnosis not present

## 2022-05-07 DIAGNOSIS — I2510 Atherosclerotic heart disease of native coronary artery without angina pectoris: Secondary | ICD-10-CM

## 2022-05-07 DIAGNOSIS — I10 Essential (primary) hypertension: Secondary | ICD-10-CM | POA: Diagnosis not present

## 2022-05-07 DIAGNOSIS — R079 Chest pain, unspecified: Secondary | ICD-10-CM | POA: Diagnosis not present

## 2022-05-07 DIAGNOSIS — Z720 Tobacco use: Secondary | ICD-10-CM | POA: Diagnosis not present

## 2022-05-07 DIAGNOSIS — I200 Unstable angina: Secondary | ICD-10-CM | POA: Diagnosis not present

## 2022-05-07 DIAGNOSIS — F17210 Nicotine dependence, cigarettes, uncomplicated: Secondary | ICD-10-CM | POA: Insufficient documentation

## 2022-05-07 DIAGNOSIS — I25110 Atherosclerotic heart disease of native coronary artery with unstable angina pectoris: Principal | ICD-10-CM | POA: Insufficient documentation

## 2022-05-07 DIAGNOSIS — Z8547 Personal history of malignant neoplasm of testis: Secondary | ICD-10-CM | POA: Diagnosis not present

## 2022-05-07 DIAGNOSIS — E785 Hyperlipidemia, unspecified: Secondary | ICD-10-CM

## 2022-05-07 DIAGNOSIS — Z7982 Long term (current) use of aspirin: Secondary | ICD-10-CM | POA: Insufficient documentation

## 2022-05-07 HISTORY — PX: LEFT HEART CATH AND CORONARY ANGIOGRAPHY: CATH118249

## 2022-05-07 LAB — TROPONIN I (HIGH SENSITIVITY)
Troponin I (High Sensitivity): 3 ng/L (ref <18)
Troponin I (High Sensitivity): 3 ng/L (ref <18)

## 2022-05-07 LAB — CBC
HCT: 43.9 % (ref 39.0–52.0)
Hemoglobin: 15.5 g/dL (ref 13.0–17.0)
MCH: 31.8 pg (ref 26.0–34.0)
MCHC: 35.3 g/dL (ref 30.0–36.0)
MCV: 90.1 fL (ref 80.0–100.0)
Platelets: 156 10*3/uL (ref 150–400)
RBC: 4.87 MIL/uL (ref 4.22–5.81)
RDW: 14 % (ref 11.5–15.5)
WBC: 6.9 10*3/uL (ref 4.0–10.5)
nRBC: 0 % (ref 0.0–0.2)

## 2022-05-07 LAB — BASIC METABOLIC PANEL
Anion gap: 4 — ABNORMAL LOW (ref 5–15)
BUN: 12 mg/dL (ref 8–23)
CO2: 25 mmol/L (ref 22–32)
Calcium: 9 mg/dL (ref 8.9–10.3)
Chloride: 108 mmol/L (ref 98–111)
Creatinine, Ser: 1.17 mg/dL (ref 0.61–1.24)
GFR, Estimated: >60 mL/min (ref >60)
Glucose, Bld: 111 mg/dL — ABNORMAL HIGH (ref 70–99)
Potassium: 3.7 mmol/L (ref 3.5–5.1)
Sodium: 137 mmol/L (ref 135–145)

## 2022-05-07 SURGERY — LEFT HEART CATH AND CORONARY ANGIOGRAPHY
Anesthesia: LOCAL

## 2022-05-07 MED ORDER — HYDRALAZINE HCL 20 MG/ML IJ SOLN: 10.0000 mg | INTRAMUSCULAR | Status: AC | PRN

## 2022-05-07 MED ORDER — HYDRALAZINE HCL 20 MG/ML IJ SOLN: 10.0000 mg | INTRAMUSCULAR | Status: DC | PRN

## 2022-05-07 MED ORDER — RAMIPRIL 5 MG PO CAPS: 10.0000 mg | ORAL_CAPSULE | Freq: Every day | ORAL | Status: DC

## 2022-05-07 MED ORDER — IOHEXOL 350 MG/ML SOLN
INTRAVENOUS | Status: DC | PRN
  Administered 2022-05-07: 55 mL

## 2022-05-07 MED ORDER — SODIUM CHLORIDE 0.9 % IV SOLN: INTRAVENOUS | Status: AC

## 2022-05-07 MED ORDER — LIDOCAINE HCL (PF) 1 % IJ SOLN
INTRAMUSCULAR | Status: DC | PRN
  Administered 2022-05-07: 2 mL

## 2022-05-07 MED ORDER — LIDOCAINE HCL (PF) 1 % IJ SOLN
INTRAMUSCULAR | Status: AC
  Filled 2022-05-07: qty 30

## 2022-05-07 MED ORDER — VERAPAMIL HCL 2.5 MG/ML IV SOLN
INTRAVENOUS | Status: AC
  Filled 2022-05-07: qty 2

## 2022-05-07 MED ORDER — NITROGLYCERIN 0.4 MG SL SUBL
0.4000 mg | SUBLINGUAL_TABLET | SUBLINGUAL | Status: DC | PRN
  Administered 2022-05-07: 0.4 mg via SUBLINGUAL
  Filled 2022-05-07: qty 1

## 2022-05-07 MED ORDER — PANTOPRAZOLE SODIUM 40 MG PO TBEC
40.0000 mg | DELAYED_RELEASE_TABLET | Freq: Every day | ORAL | Status: DC
  Administered 2022-05-07: 40 mg via ORAL
  Filled 2022-05-07: qty 1

## 2022-05-07 MED ORDER — SODIUM CHLORIDE 0.9% FLUSH: 3.0000 mL | INTRAVENOUS | Status: DC | PRN

## 2022-05-07 MED ORDER — SODIUM CHLORIDE 0.9% FLUSH
3.0000 mL | Freq: Two times a day (BID) | INTRAVENOUS | Status: DC
  Administered 2022-05-07: 3 mL via INTRAVENOUS

## 2022-05-07 MED ORDER — HEPARIN (PORCINE) IN NACL 1000-0.9 UT/500ML-% IV SOLN
INTRAVENOUS | Status: DC | PRN
  Administered 2022-05-07 (×2): 500 mL

## 2022-05-07 MED ORDER — VERAPAMIL HCL 2.5 MG/ML IV SOLN
INTRAVENOUS | Status: DC | PRN
  Administered 2022-05-07: 10 mL via INTRA_ARTERIAL

## 2022-05-07 MED ORDER — ATORVASTATIN CALCIUM 10 MG PO TABS
10.0000 mg | ORAL_TABLET | Freq: Every day | ORAL | Status: DC
  Administered 2022-05-07: 10 mg via ORAL
  Filled 2022-05-07: qty 1

## 2022-05-07 MED ORDER — SODIUM CHLORIDE 0.9 % IV SOLN: 250.0000 mL | INTRAVENOUS | Status: DC | PRN

## 2022-05-07 MED ORDER — SODIUM CHLORIDE 0.9 % IV SOLN: INTRAVENOUS | Status: DC

## 2022-05-07 MED ORDER — ACETAMINOPHEN 325 MG PO TABS: 650.0000 mg | ORAL_TABLET | ORAL | Status: DC | PRN

## 2022-05-07 MED ORDER — SODIUM CHLORIDE 0.9 % IV BOLUS
500.0000 mL | Freq: Once | INTRAVENOUS | Status: AC
Start: 2022-05-07 — End: 2022-05-07
  Administered 2022-05-07: 500 mL via INTRAVENOUS

## 2022-05-07 MED ORDER — ASPIRIN 81 MG PO CHEW
324.0000 mg | CHEWABLE_TABLET | Freq: Once | ORAL | Status: AC
  Administered 2022-05-07: 324 mg via ORAL
  Filled 2022-05-07: qty 4

## 2022-05-07 MED ORDER — ALLOPURINOL 300 MG PO TABS
300.0000 mg | ORAL_TABLET | Freq: Every day | ORAL | Status: DC
Start: 2022-05-07 — End: 2022-05-08
  Administered 2022-05-07: 300 mg via ORAL
  Filled 2022-05-07: qty 1

## 2022-05-07 MED ORDER — DILTIAZEM HCL ER COATED BEADS 180 MG PO CP24: 180.0000 mg | ORAL_CAPSULE | Freq: Every day | ORAL | Status: DC

## 2022-05-07 MED ORDER — HEPARIN SODIUM (PORCINE) 1000 UNIT/ML IJ SOLN
INTRAMUSCULAR | Status: AC
  Filled 2022-05-07: qty 10

## 2022-05-07 MED ORDER — ONDANSETRON HCL 4 MG/2ML IJ SOLN: 4.0000 mg | Freq: Four times a day (QID) | INTRAMUSCULAR | Status: DC | PRN

## 2022-05-07 MED ORDER — ASPIRIN 81 MG PO TBEC
81.0000 mg | DELAYED_RELEASE_TABLET | Freq: Every day | ORAL | Status: DC
Start: 2022-05-08 — End: 2022-05-08
  Administered 2022-05-08: 81 mg via ORAL
  Filled 2022-05-07: qty 1

## 2022-05-07 MED ORDER — LABETALOL HCL 5 MG/ML IV SOLN: 10.0000 mg | INTRAVENOUS | Status: DC | PRN

## 2022-05-07 MED ORDER — FENTANYL CITRATE (PF) 100 MCG/2ML IJ SOLN
INTRAMUSCULAR | Status: AC
  Filled 2022-05-07: qty 2

## 2022-05-07 MED ORDER — LABETALOL HCL 5 MG/ML IV SOLN: 10.0000 mg | INTRAVENOUS | Status: AC | PRN

## 2022-05-07 MED ORDER — HEPARIN SODIUM (PORCINE) 1000 UNIT/ML IJ SOLN
INTRAMUSCULAR | Status: DC | PRN
  Administered 2022-05-07: 5000 [IU] via INTRAVENOUS

## 2022-05-07 MED ORDER — ENOXAPARIN SODIUM 40 MG/0.4ML IJ SOSY: 40.0000 mg | PREFILLED_SYRINGE | INTRAMUSCULAR | Status: DC

## 2022-05-07 MED ORDER — MIDAZOLAM HCL 2 MG/2ML IJ SOLN
INTRAMUSCULAR | Status: AC
  Filled 2022-05-07: qty 2

## 2022-05-07 MED ORDER — POTASSIUM CHLORIDE CRYS ER 20 MEQ PO TBCR
20.0000 meq | EXTENDED_RELEASE_TABLET | Freq: Every day | ORAL | Status: DC
  Administered 2022-05-07: 20 meq via ORAL
  Filled 2022-05-07: qty 1

## 2022-05-07 MED ORDER — MIDAZOLAM HCL 2 MG/2ML IJ SOLN
INTRAMUSCULAR | Status: DC | PRN
  Administered 2022-05-07: 2 mg via INTRAVENOUS

## 2022-05-07 MED ORDER — FENTANYL CITRATE (PF) 100 MCG/2ML IJ SOLN
INTRAMUSCULAR | Status: DC | PRN
Start: 2022-05-07 — End: 2022-05-07
  Administered 2022-05-07: 50 ug via INTRAVENOUS

## 2022-05-07 SURGICAL SUPPLY — 11 items
BAND CMPR LRG ZPHR (HEMOSTASIS) ×1
BAND ZEPHYR COMPRESS 30 LONG (HEMOSTASIS) ×1 IMPLANT
CATH 5FR JL3.5 JR4 ANG PIG MP (CATHETERS) ×1 IMPLANT
GLIDESHEATH SLEND SS 6F .021 (SHEATH) ×1 IMPLANT
GUIDEWIRE INQWIRE 1.5J.035X260 (WIRE) IMPLANT
INQWIRE 1.5J .035X260CM (WIRE) ×2
KIT HEART LEFT (KITS) ×3 IMPLANT
PACK CARDIAC CATHETERIZATION (CUSTOM PROCEDURE TRAY) ×3 IMPLANT
SHEATH PROBE COVER 6X72 (BAG) ×1 IMPLANT
TRANSDUCER W/STOPCOCK (MISCELLANEOUS) ×3 IMPLANT
TUBING CIL FLEX 10 FLL-RA (TUBING) ×3 IMPLANT

## 2022-05-07 NOTE — Interval H&P Note (Signed)
History and Physical Interval Note:  05/07/2022 2:31 PM  Daniel Fields  has presented today for surgery, with the diagnosis of unstable angina.  The various methods of treatment have been discussed with the patient and family. After consideration of risks, benefits and other options for treatment, the patient has consented to  Procedure(s): LEFT HEART CATH AND CORONARY ANGIOGRAPHY (N/A) as a surgical intervention.  The patient's history has been reviewed, patient examined, no change in status, stable for surgery.  I have reviewed the patient's chart and labs.  Questions were answered to the patient's satisfaction.    Cath Lab Visit (complete for each Cath Lab visit)  Clinical Evaluation Leading to the Procedure:   ACS: No.  Non-ACS:    Anginal Classification: CCS III  Anti-ischemic medical therapy: Minimal Therapy (1 class of medications)  Non-Invasive Test Results: No non-invasive testing performed  Prior CABG: No previous CABG        Daniel Fields

## 2022-05-07 NOTE — Plan of Care (Signed)
  Problem: Education: Goal: Understanding of CV disease, CV risk reduction, and recovery process will improve 05/07/2022 2332 by Kaylyn Lim, RN Outcome: Progressing 05/07/2022 2331 by Kaylyn Lim, RN Outcome: Progressing Goal: Individualized Educational Video(s) 05/07/2022 2332 by Kaylyn Lim, RN Outcome: Progressing 05/07/2022 2331 by Kaylyn Lim, RN Outcome: Progressing   Problem: Activity: Goal: Ability to return to baseline activity level will improve 05/07/2022 2332 by Kaylyn Lim, RN Outcome: Progressing 05/07/2022 2331 by Kaylyn Lim, RN Outcome: Progressing   Problem: Cardiovascular: Goal: Ability to achieve and maintain adequate cardiovascular perfusion will improve 05/07/2022 2332 by Kaylyn Lim, RN Outcome: Progressing 05/07/2022 2331 by Kaylyn Lim, RN Outcome: Progressing   Problem: Health Behavior/Discharge Planning: Goal: Ability to safely manage health-related needs after discharge will improve 05/07/2022 2332 by Kaylyn Lim, RN Outcome: Progressing 05/07/2022 2331 by Kaylyn Lim, RN Outcome: Progressing

## 2022-05-07 NOTE — H&P (View-Only) (Signed)
Physician History and Physical     Patient ID: Daniel Fields MRN: [id number] DOB/AGE: 1956-01-03 66 y.o. Admit date: 05/07/2022  Primary Care Physician: Celene Squibb, MD Primary Cardiologist: Domenic Polite  Active Problems:   * No active hospital problems. *   HPI:  66 y.o. seen in [facility] ED at request of Dr Regenia Skeeter for angina. History of RCA/LCX stents in 2004 patent by cath in 2009 Still smoking a ppd. Had SSCP rarely 1-2/ every 2 months This am awoke with severe SSCP radiating to both arms and neck. Took baby aspirin with help Couldn't find his nitro. Improved and now pain free with nitro in ER Troponin negative no acute ECG changes Pain similar to pain with nausea that he had prior to stents. He notes that enzymes, ECG stress test and troponins were "ok" before he needed his prior interventions. 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 Did not eat this am due to nausea and SSCP  Review of systems complete and found to be negative unless listed above   Past Medical History:  Diagnosis Date   Arthritis    Blurred vision    CAD (coronary artery disease)    Stent to circumflex and RCA 2004   Chronic back pain    COPD (chronic obstructive pulmonary disease) ([facility])    Depression    Gout    Hypertension    IBS (irritable bowel syndrome)    Mixed hyperlipidemia    Sleep apnea    CPAP   Testicular cancer ([place])    Status post resection and chemotherapy, age early 71s    Family History  Problem Relation Age of Onset   Breast cancer Cousin        2    Kidney failure Brother    Heart disease Father    Hypertension Father    Stroke Father    Diabetes Mother    CAD Mother    Fibromyalgia Mother    Diabetes Brother    Colon cancer Neg Hx    Crohn's disease Neg Hx    Ulcerative colitis Neg Hx     Social History   Socioeconomic History   Marital status: Divorced    Spouse name: Not on file   Number of children: Not on file   Years of education: Not on file   Highest education level: Not on file  Occupational History   Not on file  Tobacco Use   Smoking status: Every Day    Packs/day: 1.00    Years: 40.00    Pack years: 40.00    Types: Cigarettes   Smokeless tobacco: Never  Vaping Use   Vaping Use: Some days  Substance and Sexual Activity   Alcohol use: No    Alcohol/week: 0.0 standard drinks   Drug use: No   Sexual activity: Not on file  Other Topics Concern   Not on file  Social History Narrative   Not on file   Social Determinants of Health   Financial Resource Strain: Not on file  Food Insecurity: Not on file  Transportation Needs: Not on file  Physical Activity: Not on file  Stress: Not on file  Social Connections: Not on file  Intimate Partner Violence: Not on file    Past Surgical History:  Procedure Laterality Date   CARDIAC CATHETERIZATION  2009   CHOLECYSTECTOMY     COLONOSCOPY WITH ESOPHAGOGASTRODUODENOSCOPY (EGD) N/A 07/29/2013   Procedure: COLONOSCOPY WITH ESOPHAGOGASTRODUODENOSCOPY (EGD);  Surgeon: Daneil Dolin, MD;  [facility];  Service: Endoscopy;  Laterality: N/A;  12:45   CORONARY ANGIOPLASTY WITH STENT PLACEMENT  2004   3 total stents   FOOT SURGERY Right    SURGERY SCROTAL / TESTICULAR     right testicular cancer     (Not in a hospital admission)   Physical Exam:    Affect appropriate Bronchitic male  HEENT: normal Neck supple with no adenopathy JVP normal no bruits no thyromegaly Lungs clear with no wheezing and good diaphragmatic motion Heart:  S1/S2 no  murmur, no rub, gallop or click PMI normal Abdomen: benighn, BS positve, no tenderness, no AAA no bruit.  No HSM or HJR Distal pulses intact with no bruits No edema Neuro non-focal Skin warm and dry No muscular weakness  No current facility-administered medications on file prior to encounter.   Current Outpatient Medications on File Prior to Encounter  Medication Sig Dispense Refill   aspirin EC 81 MG tablet Take 81 mg by mouth daily.     allopurinol (ZYLOPRIM) 300 MG tablet Take 300 mg by mouth at bedtime.     atorvastatin (LIPITOR) 10 MG tablet Take 1 tablet by mouth at bedtime.      diltiazem (CARDIZEM CD) 180 MG 24 hr capsule Take 180 mg by mouth at bedtime.     folic acid (FOLVITE) 1 MG tablet Take 1 mg by mouth at bedtime.     hydrochlorothiazide (HYDRODIURIL) 25 MG tablet Take 1 tablet by mouth daily.     nitroGLYCERIN (NITROSTAT) 0.4 MG SL tablet Place 1 tablet (0.4 mg total) under the tongue every 5 (five) minutes as needed. 25 tablet 3   pantoprazole (PROTONIX) 40 MG tablet Take 40 mg by mouth at bedtime.     potassium chloride SA (K-DUR,KLOR-CON) 20 MEQ tablet Take 20 mEq by mouth at bedtime.     ramipril (ALTACE) 10 MG capsule Take 10 mg by mouth at bedtime. No further refills without an appointment.      Labs:   Lab Results  Component Value Date   WBC 6.9 05/07/2022   HGB 15.5 05/07/2022   HCT 43.9 05/07/2022   MCV 90.1 05/07/2022   PLT 156 05/07/2022    Recent Labs  Lab 05/07/22 1005  NA 137  K 3.7  CL 108  CO2 25  BUN 12  CREATININE 1.17  CALCIUM 9.0  GLUCOSE 111*   Lab Results  Component Value Date   TROPONINI <0.30 03/18/2014   TROPONINI <0.30 03/17/2014    No results found for: CHOL No results found for: HDL No results found for: LDLCALC No results found for: TRIG No results found for: CHOLHDL No results found for: LDLDIRECT     Radiology: DG Chest 2 View  Result Date: 05/07/2022 CLINICAL DATA:  Chest pain EXAM: CHEST - 2 VIEW  COMPARISON:  03/17/2014 FINDINGS: The heart size and mediastinal contours are within normal limits. No focal airspace consolidation, pleural effusion, or pneumothorax. The visualized skeletal structures are unremarkable. IMPRESSION: No active cardiopulmonary disease. Electronically Signed   By:  Nicholas  Plundo D.O.   On: 05/07/2022 10:26    EKG: SR no acute changes   ASSESSMENT AND PLAN:   1. Angina:  worrisome SSCP similar to prior angina before old stents placed in 2004 Patent by cath in 2009 Troponin negative no acute ECG changes Favor transfer to [facility] for diagnostic cath today Have notified Dr Tamala Julian DOD at [facility] as well as Navigator and cath lab as well as [facility] Pain free now with negative enzymes/ECG no heparin for now  2. Smoking:  CXR with no lesions counseled on cessation < 10 minutes use to smoke 2 ppd now one Daughter is a respiratory Rx at [facility]  3. HTN:  stable hold diuretic for cath  4. HLD:  on lipitor check labs    Signed: Collier Salina Nishan6/05/2022, 12:03 PM

## 2022-05-07 NOTE — Consult Note (Signed)
Physician History and Physical     Patient ID: Daniel Fields MRN: [id number] DOB/AGE: Apr 01, 1956 66 y.o. Admit date: 05/07/2022  Primary Care Physician: Celene Squibb, MD Primary Cardiologist: Domenic Polite  Active Problems:   * No active hospital problems. *   HPI:  66 y.o. seen in [facility] ED at request of Dr Regenia Skeeter for angina. History of RCA/LCX stents in 2004 patent by cath in 2009 Still smoking a ppd. Had SSCP rarely 1-2/ every 2 months This am awoke with severe SSCP radiating to both arms and neck. Took baby aspirin with help Couldn't find his nitro. Improved and now pain free with nitro in ER Troponin negative no acute ECG changes Pain similar to pain with nausea that he had prior to stents. He notes that enzymes, ECG stress test and troponins were "ok" before he needed his prior interventions. 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 Did not eat this am due to nausea and SSCP  Review of systems complete and found to be negative unless listed above   Past Medical History:  Diagnosis Date   Arthritis    Blurred vision    CAD (coronary artery disease)    Stent to circumflex and RCA 2004   Chronic back pain    COPD (chronic obstructive pulmonary disease) (HCC)    Depression    Gout    Hypertension    IBS (irritable bowel syndrome)    Mixed hyperlipidemia    Sleep apnea    CPAP   Testicular cancer ([place])    Status post resection and chemotherapy, age early 91s    Family History  Problem Relation Age of Onset   Breast cancer Cousin        2    Kidney failure Brother    Heart disease Father    Hypertension Father    Stroke Father    Diabetes Mother    CAD Mother    Fibromyalgia Mother    Diabetes Brother    Colon cancer Neg Hx    Crohn's disease Neg Hx    Ulcerative colitis Neg Hx     Social History   Socioeconomic History   Marital status: Divorced    Spouse name: Not on file   Number of children: Not on file   Years of education: Not on file   Highest education level: Not on file  Occupational History   Not on file  Tobacco Use   Smoking status: Every Day    Packs/day: 1.00    Years: 40.00    Pack years: 40.00    Types: Cigarettes   Smokeless tobacco: Never  Vaping Use   Vaping Use: Some days  Substance and Sexual Activity   Alcohol use: No    Alcohol/week: 0.0 standard drinks   Drug use: No   Sexual activity: Not on file  Other Topics Concern   Not on file  Social History Narrative   Not on file   Social Determinants of Health   Financial Resource Strain: Not on file  Food Insecurity: Not on file  Transportation Needs: Not on file  Physical Activity: Not on file  Stress: Not on file  Social Connections: Not on file  Intimate Partner Violence: Not on file    Past Surgical History:  Procedure Laterality Date   CARDIAC CATHETERIZATION  2009   CHOLECYSTECTOMY     COLONOSCOPY WITH ESOPHAGOGASTRODUODENOSCOPY (EGD) N/A 07/29/2013   Procedure: COLONOSCOPY WITH ESOPHAGOGASTRODUODENOSCOPY (EGD);  Surgeon: Daneil Dolin, MD;  Location: [facility];  Service: Endoscopy;  Laterality: N/A;  12:45   CORONARY ANGIOPLASTY WITH STENT PLACEMENT  2004   3 total stents   FOOT SURGERY Right    SURGERY SCROTAL / TESTICULAR     right testicular cancer     (Not in a hospital admission)   Physical Exam:    Affect appropriate Bronchitic male  HEENT: normal Neck supple with no adenopathy JVP normal no bruits no thyromegaly Lungs clear with no wheezing and good diaphragmatic motion Heart:  S1/S2 no  murmur, no rub, gallop or click PMI normal Abdomen: benighn, BS positve, no tenderness, no AAA no bruit.  No HSM or HJR Distal pulses intact with no bruits No edema Neuro non-focal Skin warm and dry No muscular weakness  No current facility-administered medications on file prior to encounter.   Current Outpatient Medications on File Prior to Encounter  Medication Sig Dispense Refill   aspirin EC 81 MG tablet Take 81 mg by mouth daily.     allopurinol (ZYLOPRIM) 300 MG tablet Take 300 mg by mouth at bedtime.     atorvastatin (LIPITOR) 10 MG tablet Take 1 tablet by mouth at bedtime.      diltiazem (CARDIZEM CD) 180 MG 24 hr capsule Take 180 mg by mouth at bedtime.     folic acid (FOLVITE) 1 MG tablet Take 1 mg by mouth at bedtime.     hydrochlorothiazide (HYDRODIURIL) 25 MG tablet Take 1 tablet by mouth daily.     nitroGLYCERIN (NITROSTAT) 0.4 MG SL tablet Place 1 tablet (0.4 mg total) under the tongue every 5 (five) minutes as needed. 25 tablet 3   pantoprazole (PROTONIX) 40 MG tablet Take 40 mg by mouth at bedtime.     potassium chloride SA (K-DUR,KLOR-CON) 20 MEQ tablet Take 20 mEq by mouth at bedtime.     ramipril (ALTACE) 10 MG capsule Take 10 mg by mouth at bedtime. No further refills without an appointment.      Labs:   Lab Results  Component Value Date   WBC 6.9 05/07/2022   HGB 15.5 05/07/2022   HCT 43.9 05/07/2022   MCV 90.1 05/07/2022   PLT 156 05/07/2022    Recent Labs  Lab 05/07/22 1005  NA 137  K 3.7  CL 108  CO2 25  BUN 12  CREATININE 1.17  CALCIUM 9.0  GLUCOSE 111*   Lab Results  Component Value Date   TROPONINI <0.30 03/18/2014   TROPONINI <0.30 03/17/2014    No results found for: CHOL No results found for: HDL No results found for: LDLCALC No results found for: TRIG No results found for: CHOLHDL No results found for: LDLDIRECT     Radiology: DG Chest 2 View  Result Date: 05/07/2022 CLINICAL DATA:  Chest pain EXAM: CHEST - 2 VIEW  COMPARISON:  03/17/2014 FINDINGS: The heart size and mediastinal contours are within normal limits. No focal airspace consolidation, pleural effusion, or pneumothorax. The visualized skeletal structures are unremarkable. IMPRESSION: No active cardiopulmonary disease. Electronically Signed   By:  Nicholas  Plundo D.O.   On: 05/07/2022 10:26    EKG: SR no acute changes   ASSESSMENT AND PLAN:   1. Angina:  worrisome SSCP similar to prior angina before old stents placed in 2004 Patent by cath in 2009 Troponin negative no acute ECG changes Favor transfer to [facility] for diagnostic cath today Have notified Dr Tamala Julian DOD at [facility] as well as Navigator and cath lab as well as [facility] Pain free now with negative enzymes/ECG no heparin for now  2. Smoking:  CXR with no lesions counseled on cessation < 10 minutes use to smoke 2 ppd now one Daughter is a respiratory Rx at [facility]  3. HTN:  stable hold diuretic for cath  4. HLD:  on lipitor check labs    Signed: Collier Salina Nishan6/05/2022, 12:03 PM

## 2022-05-07 NOTE — ED Provider Notes (Signed)
[facility] Provider Note   CSN: [id number] Arrival date & time: 05/07/22  9562     History  Chief Complaint  Patient presents with   Chest Pain    Daniel Fields is a 66 y.o. male.  HPI 66 year old male presents with chest pain.  Started about 2 hours prior to arrival.  Feels like a pressure over his anterior chest and radiates up into his neck and down both arms, left greater than right.  No significant shortness of breath but he has felt quite nauseated, especially when the pressure gets worse as well as some clamminess.  Feels like prior cardiac type chest pain.  He has a history of CAD and has required multiple stents.  This feels like the last time he required stents.  No leg swelling.  Took a baby aspirin this morning and the pain seemed to get partially better but then started getting worse again.  Right now he rates it as a 4 and is better than it was this morning.  Home Medications Prior to Admission medications   Medication Sig Start Date End Date Taking? Authorizing Provider  allopurinol (ZYLOPRIM) 100 MG tablet Take 100 mg by mouth daily. 04/30/22  Yes [provider]  aspirin EC 81 MG tablet Take 81 mg by mouth daily.   Yes [provider]  atorvastatin (LIPITOR) 10 MG tablet Take 20 mg by mouth at bedtime. 01/05/14  Yes [provider]  diltiazem (CARDIZEM CD) 180 MG 24 hr capsule Take 180 mg by mouth at bedtime.   Yes [provider]  folic acid (FOLVITE) 1 MG tablet Take 1 mg by mouth at bedtime.   Yes [provider]  HYDROcodone-acetaminophen (NORCO) 7.5-325 MG tablet Take 1 tablet by mouth 4 (four) times daily as needed for moderate pain. 05/05/22  Yes [provider]  nitroGLYCERIN (NITROSTAT) 0.4 MG SL tablet Place 1 tablet (0.4 mg total) under the tongue every 5 (five) minutes as needed. Patient taking differently: Place 0.4 mg under the tongue every 5 (five) minutes as needed for chest pain.  03/25/21  Yes Satira Sark, MD  pantoprazole (PROTONIX) 40 MG tablet Take 40 mg by mouth at bedtime.   Yes [provider]  Potassium Chloride ER 20 MEQ TBCR Take 1 tablet by mouth 2 (two) times daily. 04/29/22  Yes [provider]  ramipril (ALTACE) 10 MG capsule Take 10 mg by mouth at bedtime. 02/03/14  Yes Lorretta Harp, MD      Allergies    Patient has no known allergies.    Review of Systems   Review of Systems  Respiratory:  Negative for shortness of breath.   Cardiovascular:  Positive for chest pain.  Gastrointestinal:  Positive for nausea. Negative for abdominal pain and vomiting.   Physical Exam Updated Vital Signs BP (!) 150/75   Pulse 78   Temp 98.4 F (36.9 C) (Oral)   Resp 18   Ht '5\' 10"'$  (1.778 m)   Wt 97.5 kg   SpO2 100%   BMI 30.85 kg/m  Physical Exam Vitals and nursing note reviewed.  Constitutional:      General: He is not in acute distress.    Appearance: He is well-developed. He is not diaphoretic.  HENT:     Head: Normocephalic and atraumatic.  Cardiovascular:     Rate and Rhythm: Normal rate and regular rhythm.     Heart sounds: Normal heart sounds.  Pulmonary:  Effort: Pulmonary effort is normal.     Breath sounds: Normal breath sounds.  Chest:     Chest wall: No tenderness.  Abdominal:     Palpations: Abdomen is soft.     Tenderness: There is no abdominal tenderness.  Skin:    General: Skin is warm and dry.  Neurological:     Mental Status: He is alert.    ED Results / Procedures / Treatments   Labs (all labs ordered are listed, but only abnormal results are displayed) Labs Reviewed  BASIC METABOLIC PANEL - Abnormal; Notable for the following components:      Result Value   Glucose, Bld 111 (*)    Anion gap 4 (*)    All other components within normal limits  CBC  TROPONIN I (HIGH SENSITIVITY)  TROPONIN I (HIGH SENSITIVITY)    EKG EKG Interpretation  Date/Time:  Wednesday May 07 2022 10:41:14  EDT Ventricular Rate:  65 PR Interval:  224 QRS Duration: 87 QT Interval:  401 QTC Calculation: 417 R Axis:   48 Text Interpretation: Sinus rhythm Prolonged PR interval no acute ST/T changes Confirmed by Sherwood Gambler [phone]) on 05/07/2022 10:53:05 AM  Radiology DG Chest 2 View  Result Date: 05/07/2022 CLINICAL DATA:  Chest pain EXAM: CHEST - 2 VIEW COMPARISON:  03/17/2014 FINDINGS: The heart size and mediastinal contours are within normal limits. No focal airspace consolidation, pleural effusion, or pneumothorax. The visualized skeletal structures are unremarkable. IMPRESSION: No active cardiopulmonary disease. Electronically Signed   By: Davina Poke D.O.   On: 05/07/2022 10:26    Procedures Procedures    Medications Ordered in ED Medications  nitroGLYCERIN (NITROSTAT) SL tablet 0.4 mg ( Sublingual MAR Hold 05/07/22 1347)  0.9 %  sodium chloride infusion ( Intravenous New Bag/Given 05/07/22 1239)  Heparin (Porcine) in NaCl 1000-0.9 UT/500ML-% SOLN (500 mLs  Given 05/07/22 1431)  fentaNYL (SUBLIMAZE) injection (50 mcg Intravenous Given 05/07/22 1431)  midazolam (VERSED) injection (2 mg Intravenous Given 05/07/22 1431)  lidocaine (PF) (XYLOCAINE) 1 % injection (2 mLs  Given 05/07/22 1443)  Radial Cocktail/Verapamil only (10 mLs Intra-arterial Given 05/07/22 1448)  heparin sodium (porcine) injection (5,000 Units Intravenous Given 05/07/22 1450)  iohexol (OMNIPAQUE) 350 MG/ML injection (55 mLs  Given 05/07/22 1505)  aspirin chewable tablet 324 mg (324 mg Oral Given 05/07/22 1037)  sodium chloride 0.9 % bolus 500 mL (0 mLs Intravenous Stopped 05/07/22 1225)    ED Course/ Medical Decision Making/ A&P                           Medical Decision Making Amount and/or Complexity of Data Reviewed External Data Reviewed: notes. Labs: ordered. Radiology: independent interpretation performed. ECG/medicine tests: ordered and independent interpretation performed.  Risk OTC drugs. Prescription drug  management. Decision regarding hospitalization.   Patient presents with chest pain at rest that sounds like previous anginal type chest pain.  It did go away with aspirin and nitro here.  ECG without acute and obvious ischemia.  However with his multiple comorbidities including known CAD with prior stents, hypertension and smoking history he is high risk for ACS.  I have consulted Dr. Johnsie Cancel after his first troponin which is normal.  He recommends admission and will organize the admission and transfer to [facility] for heart cath.  Chest x-ray image viewed by myself and there is no pneumonia or pneumothorax.  Pain has resolved after the nitro and aspirin here.  Final Clinical Impression(s) / ED Diagnoses Final diagnoses:  Unstable angina [facility])    Rx / DC Orders ED Discharge Orders     None         Sherwood Gambler, MD 05/07/22 1521

## 2022-05-07 NOTE — ED Triage Notes (Signed)
Pt to the ED with chest pain that began 2 hours ago described as pressure radiating to both upper limbs.  Pt states he took 81 mg of aspirin when the pain began.  Pt has a history of 5 stents

## 2022-05-07 NOTE — ED Notes (Signed)
Pt going to Xray at this time 

## 2022-05-08 ENCOUNTER — Encounter (HOSPITAL_COMMUNITY): Payer: Self-pay | Admitting: Cardiovascular Disease

## 2022-05-08 DIAGNOSIS — Z955 Presence of coronary angioplasty implant and graft: Secondary | ICD-10-CM | POA: Diagnosis not present

## 2022-05-08 DIAGNOSIS — R079 Chest pain, unspecified: Secondary | ICD-10-CM | POA: Diagnosis not present

## 2022-05-08 DIAGNOSIS — I25110 Atherosclerotic heart disease of native coronary artery with unstable angina pectoris: Secondary | ICD-10-CM | POA: Diagnosis not present

## 2022-05-08 DIAGNOSIS — Z79899 Other long term (current) drug therapy: Secondary | ICD-10-CM | POA: Diagnosis not present

## 2022-05-08 DIAGNOSIS — I200 Unstable angina: Secondary | ICD-10-CM

## 2022-05-08 DIAGNOSIS — Z7982 Long term (current) use of aspirin: Secondary | ICD-10-CM | POA: Diagnosis not present

## 2022-05-08 LAB — LIPID PANEL
Cholesterol: 97 mg/dL (ref 0–200)
HDL: 19 mg/dL — ABNORMAL LOW (ref >40)
LDL Cholesterol: 45 mg/dL (ref 0–99)
Total CHOL/HDL Ratio: 5.1 RATIO
Triglycerides: 164 mg/dL — ABNORMAL HIGH (ref <150)
VLDL: 33 mg/dL (ref 0–40)

## 2022-05-08 LAB — BASIC METABOLIC PANEL
Anion gap: 6 (ref 5–15)
BUN: 11 mg/dL (ref 8–23)
CO2: 23 mmol/L (ref 22–32)
Calcium: 8.6 mg/dL — ABNORMAL LOW (ref 8.9–10.3)
Chloride: 111 mmol/L (ref 98–111)
Creatinine, Ser: 1.17 mg/dL (ref 0.61–1.24)
GFR, Estimated: >60 mL/min (ref >60)
Glucose, Bld: 91 mg/dL (ref 70–99)
Potassium: 4 mmol/L (ref 3.5–5.1)
Sodium: 140 mmol/L (ref 135–145)

## 2022-05-08 LAB — HIV ANTIBODY (ROUTINE TESTING W REFLEX): HIV Screen 4th Generation wRfx: NONREACTIVE

## 2022-05-08 MED ORDER — ATORVASTATIN CALCIUM 10 MG PO TABS: 20.0000 mg | ORAL_TABLET | Freq: Every day | ORAL | Status: DC

## 2022-05-08 MED ORDER — NITROGLYCERIN 0.4 MG SL SUBL: 0.4000 mg | SUBLINGUAL_TABLET | SUBLINGUAL | 3 refills | Status: AC | PRN

## 2022-05-08 NOTE — Discharge Summary (Addendum)
The patient has been seen in conjunction with Vin Bhagat, PAC. All aspects of care have been considered and discussed. The patient has been personally interviewed, examined, and all clinical data has been reviewed.  Plan discharge today. Angiography reviewed.  Has moderate to moderately severe RCA disease proximal and in-stent.  Also has moderate nonobstructive disease in the left coronary system. Agree with plan for continued medical therapy and if recurrent symptoms, consider RCA intervention if ischemia can be documented by either nuclear testing or invasive FFR / RFR assessment. No enzyme bump or EKG changes. Cath site unremarkable.    Discharge Summary    Patient ID: Daniel Fields MRN: [id number]; DOB: 06/27/1956  Admit date: 05/07/2022 Discharge date: 05/08/2022  PCP:  Celene Squibb, MD   [facility] Providers Cardiologist:  Rozann Lesches, MD   {  Discharge Diagnoses    Principal Problem:   Unstable angina [facility]) Active Problems:   Chest pain of uncertain etiology   CAD   HLD  Diagnostic Studies/Procedures    LEFT HEART CATH AND CORONARY ANGIOGRAPHY   Conclusion      Prox RCA lesion is 60% stenosed.   Prox RCA to Mid RCA lesion is 40% stenosed.   Dist RCA lesion is 30% stenosed.   2nd Mrg lesion is 10% stenosed.   Prox Cx to Mid Cx lesion is 40% stenosed.   Mid LAD lesion is 50% stenosed.   The left ventricular systolic function is normal.   LV end diastolic pressure is normal.   The left ventricular ejection fraction is greater than 65% by visual estimate.   There is no mitral valve regurgitation.   The LAD is a large caliber vessel that courses to the apex. The mid vessel has a moderate stenosis that does not appear to be flow limiting. The apical LAD has severe disease, too small and distal for PCI.  The Circumflex is a large caliber vessel that terminates into an obtuse marginal branch. The stent in the obtuse marginal branch is patent with minimal  restenosis.  The RCA is a large dominant vessel with a moderate proximal stenosis, patent mid segment stent with mild to moderate restenosis. This does not appear to be flow limiting.  Normal LV systolic function Normal LVEDP   Recommendations: Continue medical management of CAD. Consider addition of long acting nitrate if he has recurrent chest discomfort.   Diagnostic Dominance: Right    History of Present Illness     Daniel Fields is a 66 y.o. male with history of CAD s/p stenting to circumflex and RCA in 2004, hypertension and hyperlipidemia transferred from [facility] for cardiac catheterization  He has done well since his PCI.  Had a low risk stress test in 2019.  Patient presented to [facility] yesterday with substernal chest pressure.  He woke up with severe upper sternal pressure radiating to his neck and arms.  Took baby aspirin but did not find his nitroglycerin.  Pain improved after nitro in ER.  Troponin negative.  EKG without acute ischemic changes.  Seen by Dr. Johnsie Cancel, given symptoms concerning for angina he was transferred to [facility] for cardiac catheterization.  Hospital Course     Consultants: None  CAD -Cardiac catheterization as summarized above.  No culprit lesion found.  RCA stent with mild to moderate restenosis.  No complications.  Renal function stable.  Discussed addition of Imdur but patient declined.  Patient will use sublingual nitroglycerin as needed.  He has close  follow-up with Dr. Domenic Polite in 2 weeks.  Continue aspirin and statin.  2.  Hypertension Blood pressure stable and controlled on ramipril and Cardizem CD  3.  Hyperlipidemia 05/08/2022: Cholesterol 97; HDL 19; LDL Cholesterol 45; Triglycerides 164; VLDL 33  -Continue Lipitor 20 mg daily.  He is not interested in uptitration.  Did the patient have an acute coronary syndrome (MI, NSTEMI, STEMI, etc) this admission?:  No                               Did the patient have a percutaneous coronary  intervention (stent / angioplasty)?:  No.    Discharge Vitals Blood pressure 125/77, pulse 68, temperature 98.5 F (36.9 C), temperature source Oral, resp. rate 18, height '5\' 10"'$  (1.778 m), weight 99 kg, SpO2 98 %.  Filed Weights   05/07/22 1001 05/07/22 1403 05/08/22 0451  Weight: 97.8 kg 97.5 kg 99 kg   Physical Exam Constitutional:      Appearance: He is well-developed.  Eyes:     Pupils: Pupils are equal, round, and reactive to light.  Cardiovascular:     Rate and Rhythm: Normal rate and regular rhythm.     Heart sounds: Normal heart sounds.     Comments: Radial cath site without hematoma or ecchymosis Pulmonary:     Effort: Pulmonary effort is normal.  Musculoskeletal:        General: Normal range of motion.  Skin:    General: Skin is warm and dry.  Neurological:     General: No focal deficit present.     Mental Status: He is alert.  Psychiatric:        Mood and Affect: Mood normal.        Behavior: Behavior normal.     Labs & Radiologic Studies    CBC Recent Labs    05/07/22 1005  WBC 6.9  HGB 15.5  HCT 43.9  MCV 90.1  PLT 629   Basic Metabolic Panel Recent Labs    05/07/22 1005 05/08/22 0120  NA 137 140  K 3.7 4.0  CL 108 111  CO2 25 23  GLUCOSE 111* 91  BUN 12 11  CREATININE 1.17 1.17  CALCIUM 9.0 8.6*    High Sensitivity Troponin:   Recent Labs  Lab 05/07/22 1005 05/07/22 1228  TROPONINIHS 3 3     Fasting Lipid Panel Recent Labs    05/08/22 0120  CHOL 97  HDL 19*  LDLCALC 45  TRIG 164*  CHOLHDL 5.1   _____________  CARDIAC CATHETERIZATION  Result Date: 05/07/2022   Prox RCA lesion is 60% stenosed.   Prox RCA to Mid RCA lesion is 40% stenosed.   Dist RCA lesion is 30% stenosed.   2nd Mrg lesion is 10% stenosed.   Prox Cx to Mid Cx lesion is 40% stenosed.   Mid LAD lesion is 50% stenosed.   The left ventricular systolic function is normal.   LV end diastolic pressure is normal.   The left ventricular ejection fraction is greater  than 65% by visual estimate.   There is no mitral valve regurgitation. The LAD is a large caliber vessel that courses to the apex. The mid vessel has a moderate stenosis that does not appear to be flow limiting. The apical LAD has severe disease, too small and distal for PCI. The Circumflex is a large caliber vessel that terminates into an obtuse marginal branch. The stent in the  obtuse marginal branch is patent with minimal restenosis. The RCA is a large dominant vessel with a moderate proximal stenosis, patent mid segment stent with mild to moderate restenosis. This does not appear to be flow limiting. Normal LV systolic function Normal LVEDP Recommendations: Continue medical management of CAD. Consider addition of long acting nitrate if he has recurrent chest discomfort.   DG Chest 2 View  Result Date: 05/07/2022 CLINICAL DATA:  Chest pain EXAM: CHEST - 2 VIEW COMPARISON:  03/17/2014 FINDINGS: The heart size and mediastinal contours are within normal limits. No focal airspace consolidation, pleural effusion, or pneumothorax. The visualized skeletal structures are unremarkable. IMPRESSION: No active cardiopulmonary disease. Electronically Signed   By: Davina Poke D.O.   On: 05/07/2022 10:26    Disposition   Pt is being discharged home today in good condition.  Follow-up Plans & Appointments     Follow-up Information     Satira Sark, MD Follow up on 05/26/2022.   Specialty: Cardiology Why: '@4pm'$  for follow up Contact information: [address] [place] [address] [phone]                Discharge Instructions     Diet - low sodium heart healthy   Complete by: As directed    Discharge instructions   Complete by: As directed    No driving for 48 hours. No lifting over 5 lbs for 1 week. No sexual activity for 1 week. You may return to work on 05/12/22. Keep procedure site clean & dry. If you notice increased pain, swelling, bleeding or pus, call/return!  You may  shower, but no soaking baths/hot tubs/pools for 1 week.   Increase activity slowly   Complete by: As directed        Discharge Medications   Allergies as of 05/08/2022   No Known Allergies      Medication List     TAKE these medications    allopurinol 100 MG tablet Commonly known as: ZYLOPRIM Take 100 mg by mouth daily.   aspirin EC 81 MG tablet Take 81 mg by mouth daily.   atorvastatin 10 MG tablet Commonly known as: LIPITOR Take 20 mg by mouth at bedtime.   diltiazem 180 MG 24 hr capsule Commonly known as: CARDIZEM CD Take 180 mg by mouth at bedtime.   folic acid 1 MG tablet Commonly known as: FOLVITE Take 1 mg by mouth at bedtime.   HYDROcodone-acetaminophen 7.5-325 MG tablet Commonly known as: NORCO Take 1 tablet by mouth 4 (four) times daily as needed for moderate pain.   nitroGLYCERIN 0.4 MG SL tablet Commonly known as: NITROSTAT Place 1 tablet (0.4 mg total) under the tongue every 5 (five) minutes as needed. What changed: reasons to take this   pantoprazole 40 MG tablet Commonly known as: PROTONIX Take 40 mg by mouth at bedtime.   Potassium Chloride ER 20 MEQ Tbcr Take 1 tablet by mouth 2 (two) times daily.   ramipril 10 MG capsule Commonly known as: ALTACE Take 10 mg by mouth at bedtime.           Outstanding Labs/Studies   N/A  Duration of Discharge Encounter   Greater than 30 minutes including physician time.  Jarrett Soho, PA 05/08/2022, 8:28 AM

## 2022-05-09 LAB — LIPOPROTEIN A (LPA): Lipoprotein (a): <8.4 nmol/L (ref <75.0)

## 2022-05-21 DIAGNOSIS — M109 Gout, unspecified: Secondary | ICD-10-CM | POA: Diagnosis not present

## 2022-05-21 DIAGNOSIS — I10 Essential (primary) hypertension: Secondary | ICD-10-CM | POA: Diagnosis not present

## 2022-05-26 ENCOUNTER — Encounter: Payer: Self-pay | Admitting: Cardiology

## 2022-05-26 ENCOUNTER — Ambulatory Visit (INDEPENDENT_AMBULATORY_CARE_PROVIDER_SITE_OTHER): Payer: Medicare Other | Admitting: Cardiology

## 2022-05-26 VITALS — BP 142/80 | HR 79 | Ht 70.0 in | Wt 215.2 lb

## 2022-05-26 DIAGNOSIS — I25119 Atherosclerotic heart disease of native coronary artery with unspecified angina pectoris: Secondary | ICD-10-CM | POA: Diagnosis not present

## 2022-05-26 DIAGNOSIS — E782 Mixed hyperlipidemia: Secondary | ICD-10-CM | POA: Diagnosis not present

## 2022-05-30 ENCOUNTER — Other Ambulatory Visit (HOSPITAL_COMMUNITY): Payer: Self-pay | Admitting: Internal Medicine

## 2022-05-30 ENCOUNTER — Other Ambulatory Visit: Payer: Self-pay | Admitting: Internal Medicine

## 2022-05-30 DIAGNOSIS — I739 Peripheral vascular disease, unspecified: Secondary | ICD-10-CM

## 2022-06-05 ENCOUNTER — Ambulatory Visit (HOSPITAL_COMMUNITY): Payer: No Typology Code available for payment source

## 2022-06-06 ENCOUNTER — Ambulatory Visit (HOSPITAL_COMMUNITY)
Admission: RE | Admit: 2022-06-06 | Discharge: 2022-06-06 | Disposition: A | Discharge status: Home / Self Care | Payer: Medicare Other | Source: Ambulatory Visit | Attending: Internal Medicine | Admitting: Internal Medicine

## 2022-06-06 DIAGNOSIS — I739 Peripheral vascular disease, unspecified: Secondary | ICD-10-CM | POA: Diagnosis not present

## 2022-06-06 DIAGNOSIS — I70213 Atherosclerosis of native arteries of extremities with intermittent claudication, bilateral legs: Secondary | ICD-10-CM | POA: Diagnosis not present

## 2022-09-30 ENCOUNTER — Other Ambulatory Visit: Payer: Self-pay | Admitting: *Deleted

## 2022-09-30 DIAGNOSIS — Z122 Encounter for screening for malignant neoplasm of respiratory organs: Secondary | ICD-10-CM

## 2022-09-30 DIAGNOSIS — Z87891 Personal history of nicotine dependence: Secondary | ICD-10-CM

## 2022-09-30 DIAGNOSIS — F17210 Nicotine dependence, cigarettes, uncomplicated: Secondary | ICD-10-CM

## 2022-10-01 ENCOUNTER — Ambulatory Visit (HOSPITAL_COMMUNITY): Admission: RE | Admit: 2022-10-01 | Payer: Medicare Other | Source: Ambulatory Visit

## 2022-11-03 DIAGNOSIS — E782 Mixed hyperlipidemia: Secondary | ICD-10-CM | POA: Diagnosis not present

## 2022-11-03 DIAGNOSIS — M109 Gout, unspecified: Secondary | ICD-10-CM | POA: Diagnosis not present

## 2022-11-10 ENCOUNTER — Encounter: Payer: Self-pay | Admitting: Internal Medicine

## 2022-11-10 ENCOUNTER — Ambulatory Visit (HOSPITAL_COMMUNITY)
Admission: RE | Admit: 2022-11-10 | Discharge: 2022-11-10 | Disposition: A | Discharge status: Home / Self Care | Payer: Medicare Other | Source: Ambulatory Visit | Attending: Acute Care | Admitting: Acute Care

## 2022-11-10 DIAGNOSIS — K219 Gastro-esophageal reflux disease without esophagitis: Secondary | ICD-10-CM | POA: Diagnosis not present

## 2022-11-10 DIAGNOSIS — E782 Mixed hyperlipidemia: Secondary | ICD-10-CM | POA: Diagnosis not present

## 2022-11-10 DIAGNOSIS — I2510 Atherosclerotic heart disease of native coronary artery without angina pectoris: Secondary | ICD-10-CM | POA: Diagnosis not present

## 2022-11-10 DIAGNOSIS — Z87891 Personal history of nicotine dependence: Secondary | ICD-10-CM | POA: Diagnosis not present

## 2022-11-10 DIAGNOSIS — Z122 Encounter for screening for malignant neoplasm of respiratory organs: Secondary | ICD-10-CM | POA: Insufficient documentation

## 2022-11-10 DIAGNOSIS — I10 Essential (primary) hypertension: Secondary | ICD-10-CM | POA: Diagnosis not present

## 2022-11-10 DIAGNOSIS — F17210 Nicotine dependence, cigarettes, uncomplicated: Secondary | ICD-10-CM | POA: Insufficient documentation

## 2022-11-12 ENCOUNTER — Other Ambulatory Visit: Payer: Self-pay | Admitting: Acute Care

## 2022-11-12 DIAGNOSIS — F17210 Nicotine dependence, cigarettes, uncomplicated: Secondary | ICD-10-CM

## 2022-11-12 DIAGNOSIS — Z122 Encounter for screening for malignant neoplasm of respiratory organs: Secondary | ICD-10-CM

## 2022-11-12 DIAGNOSIS — Z87891 Personal history of nicotine dependence: Secondary | ICD-10-CM

## 2022-11-20 ENCOUNTER — Encounter: Payer: Self-pay | Admitting: Nurse Practitioner

## 2022-11-20 ENCOUNTER — Ambulatory Visit: Payer: Medicare Other | Admitting: Cardiology

## 2022-11-20 ENCOUNTER — Ambulatory Visit: Payer: Medicare Other | Attending: Cardiology | Admitting: Nurse Practitioner

## 2022-11-20 VITALS — BP 134/74 | HR 81 | Ht 70.0 in | Wt 203.4 lb

## 2022-11-20 DIAGNOSIS — I10 Essential (primary) hypertension: Secondary | ICD-10-CM | POA: Diagnosis not present

## 2022-11-20 DIAGNOSIS — Z720 Tobacco use: Secondary | ICD-10-CM | POA: Diagnosis not present

## 2022-11-20 DIAGNOSIS — E785 Hyperlipidemia, unspecified: Secondary | ICD-10-CM

## 2022-11-20 DIAGNOSIS — I25119 Atherosclerotic heart disease of native coronary artery with unspecified angina pectoris: Secondary | ICD-10-CM

## 2022-11-20 DIAGNOSIS — G4733 Obstructive sleep apnea (adult) (pediatric): Secondary | ICD-10-CM

## 2022-11-20 NOTE — Patient Instructions (Signed)
Medication Instructions:  Your physician recommends that you continue on your current medications as directed. Please refer to the Current Medication list given to you today.   Labwork: None today  Testing/Procedures: None today  Follow-Up: 6 months  Any Other Special Instructions Will Be Listed Below (If Applicable).  If you need a refill on your cardiac medications before your next appointment, please call your pharmacy.  

## 2022-11-20 NOTE — Progress Notes (Signed)
Office Visit    Patient Name: Daniel Fields Date of Encounter: 11/20/2022  Primary Care Provider:  Celene Squibb, MD Primary Cardiologist:  Rozann Lesches, MD  Chief Complaint    66 year old male with a history of CAD status post prior circumflex and RCA stenting, hypertension, hyperlipidemia, sleep apnea, tobacco abuse, and COPD, presents for follow-up related to CAD.  Past Medical History    Past Medical History:  Diagnosis Date   Arthritis    Blurred vision    CAD (coronary artery disease)    a. 2004 PCI: DES to LCX/RCA; b. 03/2008 Cath: patent stents, otw nl cors; c. Mult low risk MVs; d. 05/2022 Cath: LM nl, LAD 33m LCX 40p/m, OM2 10, RCA 60p, 40p/m ISR, 30d, EF 65%-->Med Rx.   Chronic back pain    COPD (chronic obstructive pulmonary disease) (HCC)    Depression    Gout    Hypertension    IBS (irritable bowel syndrome)    Mixed hyperlipidemia    Sleep apnea    CPAP   Testicular cancer ([place]    Status post resection and chemotherapy, age early 463s  Past Surgical History:  Procedure Laterality Date   CARDIAC CATHETERIZATION  2009   CHOLECYSTECTOMY     COLONOSCOPY WITH ESOPHAGOGASTRODUODENOSCOPY (EGD) N/A 07/29/2013   Procedure: COLONOSCOPY WITH ESOPHAGOGASTRODUODENOSCOPY (EGD);  Surgeon: RDaneil Dolin MD;  [facility];  Service: Endoscopy;  Laterality: N/A;  12:45   CORONARY ANGIOPLASTY WITH STENT PLACEMENT  2004   3 total stents   FOOT SURGERY Right    LEFT HEART CATH AND CORONARY ANGIOGRAPHY N/A 05/07/2022   Procedure: LEFT HEART CATH AND CORONARY ANGIOGRAPHY;  Surgeon: MBurnell Blanks MD;  [facility];  Service: Cardiovascular;  Laterality: N/A;   SURGERY SCROTAL / TESTICULAR     right testicular cancer    Allergies  No Known Allergies  History of Present Illness    66year old male with a history of CAD, hypertension, hyperlipidemia, sleep apnea, tobacco abuse, and COPD.  As noted, he previously underwent RCA and  circumflex stenting in 2004.  He had multiple low risk stress test over the years.  [facility].  Diagnostic catheterization showed moderate, diffuse, nonobstructive CAD with normal LV function, and he has been medically managed since.  Mr. CPatraswas last seen in cardiology clinic in June, at which time he was doing well.  Over the past 6 months, he has continued to do well.  He has occasional fleeting chest pain which she says might occur randomly, typically at rest, last a few seconds, resolved spontaneously.  He says he is not particularly bothered by this and overall has been feeling well.  He denies dyspnea, palpitations, PND, orthopnea, dizziness, syncope, edema, or early satiety.  His blood pressure was elevated today at 148/80 and he says he sometimes sees similar numbers at home.  We had a long discussion about his diet, which is chock full of processed and fast foods.  Home Medications    Current Outpatient Medications  Medication Sig Dispense Refill   allopurinol (ZYLOPRIM) 100 MG tablet Take 100 mg by mouth daily.     aspirin EC 81 MG tablet Take 81 mg by mouth daily.     atorvastatin (LIPITOR) 10 MG tablet Take 20 mg by mouth at bedtime.     diltiazem (CARDIZEM CD) 180 MG 24 hr capsule Take  180 mg by mouth at bedtime.     folic acid (FOLVITE) 1 MG tablet Take 1 mg by mouth at bedtime.     HYDROcodone-acetaminophen (NORCO) 7.5-325 MG tablet Take 1 tablet by mouth 4 (four) times daily as needed for moderate pain.     nitroGLYCERIN (NITROSTAT) 0.4 MG SL tablet Place 1 tablet (0.4 mg total) under the tongue every 5 (five) minutes as needed. 25 tablet 3   pantoprazole (PROTONIX) 40 MG tablet Take 40 mg by mouth at bedtime.     Potassium Chloride ER 20 MEQ TBCR Take 1 tablet by mouth 2 (two) times daily.     ramipril (ALTACE) 10 MG capsule Take 10 mg by mouth at bedtime.     No current facility-administered medications for this  visit.     Review of Systems    Occasional fleeting chest pain.  He denies dyspnea, palpitations, PND, orthopnea, dizziness, syncope, edema, or early satiety.  All other systems reviewed and are otherwise negative except as noted above.    Physical Exam    VS:  BP (!) 148/80   Pulse 81   Ht '5\' 10"'$  (1.778 m)   Wt 203 lb 6.4 oz (92.3 kg)   SpO2 99%   BMI 29.18 kg/m  , BMI Body mass index is 29.18 kg/m.     Vitals:   11/20/22 1314 11/20/22 1654  BP: (!) 148/80 134/74  Pulse: 81   SpO2: 99%     GEN: Well nourished, well developed, in no acute distress. HEENT: normal. Neck: Supple, no JVD, carotid bruits, or masses. Cardiac: RRR, no murmurs, rubs, or gallops. No clubbing, cyanosis, edema.  Radials 2+/PT 2+ and equal bilaterally.  Respiratory:  Respirations regular and unlabored, clear to auscultation bilaterally. GI: Soft, nontender, nondistended, BS + x 4. MS: no deformity or atrophy. Skin: warm and dry, no rash. Neuro:  Strength and sensation are intact. Psych: Normal affect.  Accessory Clinical Findings    Labs dated November 03, 2022 from [facility]:  Hemoglobin 16.7, hematocrit 47.9, WBC 7.3, platelets 137 sodium 140, potassium 4.2, chloride 102, CO2 22, BUN 16, creatinine 1.17, glucose 105 Calcium 9.8, total protein 7.1, albumin 4.9 Total bilirubin 0.6, alkaline phosphatase 111, AST 19, ALT 25  total cholesterol 116, triglycerides 143, HDL 24, LDL 67  Assessment & Plan    1.  Coronary artery disease: Status post prior circumflex and RCA stenting in 2004 with patent stents on catheterization in 2009 and again on catheterization in July 2023.  Otherwise had nonobstructive disease on most recent catheterization including a 60% proximal RCA stenosis and 50% mid LAD stenosis.  We reviewed his cath report in detail today as he had several questions regarding his previous hospitalization and what he can do to prevent progression of disease.  He recently had lipids, which are at  goal with an LDL of 67.  His blood pressure was elevated on arrival though this improved to 134/74 after talking with him some.  He will remain on aspirin, statin, calcium channel blocker, and ACE inhibitor therapy.  I encouraged regular exercise-30 minutes a day for at least 5 days a week, as well as 2 to 3 days a week of resistance training.  He continues to smoke and I strongly encouraged smoking cessation.  He says he has been working on it and plans to be quit by the time he sees [place] in 6 months.  He was not interested in either nicotine patch or Wellbutrin/Chantix.  2.  Essential  hypertension:  BP elevated on arrival and slightly better on repeat.  He is currently on diltiazem 180 mg daily and ramipril 10 mg daily.  We discussed his diet at length today and as outlined above, he partakes in quite a bit of fast and processed food.  We discussed the importance of sodium restriction and eating a more whole foods based diet.  Further, he does not routinely exercise.  Encouraged him to exercise 30 minutes most days of the week as outlined above.  3.  Hyperlipidemia: LDL at goal at 67.  He remains on atorvastatin therapy.  4.  Tobacco abuse: Smoking a pack a day.  Complete cessation advised.  Offered him nicotine replacement and/or Wellbutrin/Chantix though at this time, he is not interested.  He says he is trying to get his mind in the right place and expects to be quit in the next 6 months.  5.  Obstructive sleep apnea: Compliant with CPAP at home.  6.  Disposition: Follow-up in clinic in 6 months or sooner if necessary.   Murray Hodgkins, NP 11/20/2022, 1:38 PM

## 2023-04-16 ENCOUNTER — Other Ambulatory Visit: Payer: Self-pay | Admitting: Physician Assistant

## 2023-04-16 DIAGNOSIS — R221 Localized swelling, mass and lump, neck: Secondary | ICD-10-CM | POA: Diagnosis not present

## 2023-04-16 DIAGNOSIS — H6123 Impacted cerumen, bilateral: Secondary | ICD-10-CM | POA: Diagnosis not present

## 2023-05-06 DIAGNOSIS — E782 Mixed hyperlipidemia: Secondary | ICD-10-CM | POA: Diagnosis not present

## 2023-05-06 DIAGNOSIS — Z125 Encounter for screening for malignant neoplasm of prostate: Secondary | ICD-10-CM | POA: Diagnosis not present

## 2023-05-06 DIAGNOSIS — M109 Gout, unspecified: Secondary | ICD-10-CM | POA: Diagnosis not present

## 2023-05-06 DIAGNOSIS — R7301 Impaired fasting glucose: Secondary | ICD-10-CM | POA: Diagnosis not present

## 2023-05-12 DIAGNOSIS — K219 Gastro-esophageal reflux disease without esophagitis: Secondary | ICD-10-CM | POA: Diagnosis not present

## 2023-05-12 DIAGNOSIS — E782 Mixed hyperlipidemia: Secondary | ICD-10-CM | POA: Diagnosis not present

## 2023-05-12 DIAGNOSIS — I10 Essential (primary) hypertension: Secondary | ICD-10-CM | POA: Diagnosis not present

## 2023-05-12 DIAGNOSIS — Z0001 Encounter for general adult medical examination with abnormal findings: Secondary | ICD-10-CM | POA: Diagnosis not present

## 2023-05-12 DIAGNOSIS — B356 Tinea cruris: Secondary | ICD-10-CM | POA: Diagnosis not present

## 2023-05-12 DIAGNOSIS — I2510 Atherosclerotic heart disease of native coronary artery without angina pectoris: Secondary | ICD-10-CM | POA: Diagnosis not present

## 2023-05-19 ENCOUNTER — Ambulatory Visit
Admission: RE | Admit: 2023-05-19 | Discharge: 2023-05-19 | Disposition: A | Discharge status: Home / Self Care | Payer: Medicare Other | Source: Ambulatory Visit | Attending: Physician Assistant | Admitting: Physician Assistant

## 2023-05-19 DIAGNOSIS — R221 Localized swelling, mass and lump, neck: Secondary | ICD-10-CM

## 2023-05-19 MED ORDER — IOPAMIDOL (ISOVUE-300) INJECTION 61%
75.0000 mL | Freq: Once | INTRAVENOUS | Status: AC | PRN
  Administered 2023-05-19: 75 mL via INTRAVENOUS

## 2023-06-01 DIAGNOSIS — K080 Exfoliation of teeth due to systemic causes: Secondary | ICD-10-CM | POA: Diagnosis not present

## 2023-06-03 DIAGNOSIS — I10 Essential (primary) hypertension: Secondary | ICD-10-CM | POA: Diagnosis not present

## 2023-06-03 DIAGNOSIS — Z6830 Body mass index (BMI) 30.0-30.9, adult: Secondary | ICD-10-CM | POA: Diagnosis not present

## 2023-06-03 DIAGNOSIS — E669 Obesity, unspecified: Secondary | ICD-10-CM | POA: Diagnosis not present

## 2023-06-18 DIAGNOSIS — K080 Exfoliation of teeth due to systemic causes: Secondary | ICD-10-CM | POA: Diagnosis not present

## 2023-06-26 DIAGNOSIS — F17200 Nicotine dependence, unspecified, uncomplicated: Secondary | ICD-10-CM | POA: Diagnosis not present

## 2023-06-26 DIAGNOSIS — R221 Localized swelling, mass and lump, neck: Secondary | ICD-10-CM | POA: Diagnosis not present

## 2023-08-24 ENCOUNTER — Ambulatory Visit: Payer: Medicare Other | Attending: Medical | Admitting: Medical

## 2023-08-24 ENCOUNTER — Encounter: Payer: Self-pay | Admitting: *Deleted

## 2023-08-24 ENCOUNTER — Encounter: Payer: Self-pay | Admitting: Medical

## 2023-08-24 VITALS — BP 142/80 | HR 76 | Ht 70.0 in | Wt 217.6 lb

## 2023-08-24 DIAGNOSIS — R079 Chest pain, unspecified: Secondary | ICD-10-CM

## 2023-08-24 DIAGNOSIS — I25119 Atherosclerotic heart disease of native coronary artery with unspecified angina pectoris: Secondary | ICD-10-CM | POA: Diagnosis not present

## 2023-08-24 DIAGNOSIS — I10 Essential (primary) hypertension: Secondary | ICD-10-CM | POA: Diagnosis not present

## 2023-08-24 DIAGNOSIS — Z01810 Encounter for preprocedural cardiovascular examination: Secondary | ICD-10-CM | POA: Diagnosis not present

## 2023-08-24 DIAGNOSIS — I739 Peripheral vascular disease, unspecified: Secondary | ICD-10-CM

## 2023-08-24 NOTE — Patient Instructions (Signed)
Medication Instructions:  Your physician recommends that you continue on your current medications as directed. Please refer to the Current Medication list given to you today.  *If you need a refill on your cardiac medications before your next appointment, please call your pharmacy*   Lab Work: NONE   If you have labs (blood work) drawn today and your tests are completely normal, you will receive your results only by: MyChart Message (if you have MyChart) OR A paper copy in the mail If you have any lab test that is abnormal or we need to change your treatment, we will call you to review the results.   Testing/Procedures: Your physician has requested that you have a lexiscan myoview. For further information please visit [email or web address] Please follow instruction sheet, as given.  Your physician has requested that you have an ankle brachial index (ABI). During this test an ultrasound and blood pressure cuff are used to evaluate the arteries that supply the arms and legs with blood. Allow thirty minutes for this exam. There are no restrictions or special instructions.   Follow-Up: At [facility], you and your health needs are our priority.  As part of our continuing mission to provide you with exceptional heart care, we have created designated Provider Care Teams.  These Care Teams include your primary Cardiologist (physician) and Advanced Practice Providers (APPs -  Physician Assistants and Nurse Practitioners) who all work together to provide you with the care you need, when you need it.  We recommend signing up for the patient portal called "MyChart".  Sign up information is provided on this After Visit Summary.  MyChart is used to connect with patients for Virtual Visits (Telemedicine).  Patients are able to view lab/test results, encounter notes, upcoming appointments, etc.  Non-urgent messages can be sent to your provider as well.   To learn more about what you can do with  MyChart, go to [email or web address].    Your next appointment:   6 week(s)  Provider:   You may see Nona Dell, MD or one of the following Advanced Practice Providers on your designated Care Team:   Randall An, PA-C  Jacolyn Reedy, PA-C     Other Instructions Thank you for choosing [facility]!

## 2023-08-24 NOTE — Progress Notes (Signed)
Cardiology Office Note:    Date:  08/24/2023   ID:  Daniel Fields, Daniel Fields, Daniel Fields, Daniel Fields  PCP:  Benita Stabile, MD  [facility] Cardiologist:  Nona Dell, MD  [facility] Electrophysiologist:  None   Referring MD: Benita Stabile, MD   Chief Complaint: pre-op visit  History of Present Illness:    Daniel Fields is a 67 y.o. male with a hx of CAD s/p prior circumflex and RCA stenting, HTN, HLD, sleep apnea, tobacco abuse, COPD who presents for pre-op visit.   Patient underwent previous RCA and circumflex stenting in 2004.  He had multiple low risk stress test over the years.  In June of this year, he presented to [facility] with chest pain and ruled out.  Diagnostic cath showed moderate, diffuse, nonobstructive CAD with normal LV function, and he has been medically managed since.  Patient was last seen in December 2023 and was doing overall well.  He reported rare fleeting chest pain.  No changes were made.  Today, the patient presents pre-op visit prior to thyroid surgery, which is scheduled for September 07, 2023. He says in the 1st week in March, he had a sharp chest pain that lasted 1 hour. He took ASA 325mg  and SL NTG and the pain went away. He had associated nasuea and weakness. NO SOB. Pain was a little different than prior stenting. EKG with no changes. He denies LLE, pnd, orthopnea. He reports occasional fluttering. He reports bilateral claudication, can get painful and burning. He is taking losartan.   Past Medical History:  Diagnosis Date   Arthritis    Blurred vision    CAD (coronary artery disease)    a. 2004 PCI: DES to LCX/RCA; b. 03/2008 Cath: patent stents, otw nl cors; c. Mult low risk MVs; d. 05/2022 Cath: LM nl, LAD 63m, LCX 40p/m, OM2 10, RCA 60p, 40p/m ISR, 30d, EF 65%-->Med Rx.   Chronic back pain    COPD (chronic obstructive pulmonary disease) (HCC)    Depression    Gout    Hypertension    IBS (irritable bowel syndrome)    Mixed hyperlipidemia     Sleep apnea    CPAP   Testicular cancer (HCC)    Status post resection and chemotherapy, age early 57s    Past Surgical History:  Procedure Laterality Date   CARDIAC CATHETERIZATION  2009   CHOLECYSTECTOMY     COLONOSCOPY WITH ESOPHAGOGASTRODUODENOSCOPY (EGD) N/A 07/29/2013   Procedure: COLONOSCOPY WITH ESOPHAGOGASTRODUODENOSCOPY (EGD);  Surgeon: Corbin Ade, MD;  [facility];  Service: Endoscopy;  Laterality: N/A;  12:45   CORONARY ANGIOPLASTY WITH STENT PLACEMENT  2004   3 total stents   FOOT SURGERY Right    LEFT HEART CATH AND CORONARY ANGIOGRAPHY N/A 05/07/2022   Procedure: LEFT HEART CATH AND CORONARY ANGIOGRAPHY;  Surgeon: Kathleene Hazel, MD;  [facility];  Service: Cardiovascular;  Laterality: N/A;   SURGERY SCROTAL / TESTICULAR     right testicular cancer    Current Medications: Current Meds  Medication Sig   allopurinol (ZYLOPRIM) 100 MG tablet Take 100 mg by mouth daily.   aspirin EC 81 MG tablet Take 81 mg by mouth daily.   atorvastatin (LIPITOR) 10 MG tablet Take 20 mg by mouth at bedtime.   diltiazem (CARDIZEM CD) 180 MG 24 hr capsule Take 180 mg by mouth at bedtime.   folic acid (FOLVITE) 1 MG tablet Take 1 mg by  mouth at bedtime.   HYDROcodone-acetaminophen (NORCO) 7.5-325 MG tablet Take 1 tablet by mouth 4 (four) times daily as needed for moderate pain.   losartan (COZAAR) 100 MG tablet Take 1 tablet by mouth daily.   nitroGLYCERIN (NITROSTAT) 0.4 MG SL tablet Place 1 tablet (0.4 mg total) under the tongue every 5 (five) minutes as needed.   pantoprazole (PROTONIX) 40 MG tablet Take 40 mg by mouth at bedtime.   Potassium Chloride ER 20 MEQ TBCR Take 1 tablet by mouth daily.     Allergies:   Patient has no known allergies.   Social History   Socioeconomic History   Marital status: Divorced    Spouse name: Not on file   Number of children: Not on file   Years of education: Not on file   Highest education level: Not on  file  Occupational History   Not on file  Tobacco Use   Smoking status: Every Day    Current packs/day: 1.00    Average packs/day: 1 pack/day for 40.0 years (40.0 ttl pk-yrs)    Types: Cigarettes   Smokeless tobacco: Never   Tobacco comments:    Cutting back - goal is to quit   Vaping Use   Vaping status: Some Days  Substance and Sexual Activity   Alcohol use: No    Alcohol/week: 0.0 standard drinks of alcohol   Drug use: No   Sexual activity: Not on file  Other Topics Concern   Not on file  Social History Narrative   Not on file   Social Determinants of Health   Financial Resource Strain: Not on file  Food Insecurity: Low Risk  (06/26/2023)   Received from [facility]   Hunger Vital Sign    Worried About Running Out of Food in the Last Year: Never true    Ran Out of Food in the Last Year: Never true  Transportation Needs: Not on file (06/26/2023)  Physical Activity: Not on file  Stress: Not on file  Social Connections: Not on file     Family History: The patient's family history includes Breast cancer in his cousin; CAD in his mother; Diabetes in his brother and mother; Fibromyalgia in his mother; Heart disease in his father; Hypertension in his father; Kidney failure in his brother; Stroke in his father. There is no history of Colon cancer, Crohn's disease, or Ulcerative colitis.  ROS:   Please see the history of present illness.     All other systems reviewed and are negative.  EKGs/Labs/Other Studies Reviewed:    The following studies were reviewed today:  LHC 05/2022    Prox RCA lesion is 60% stenosed.   Prox RCA to Mid RCA lesion is 40% stenosed.   Dist RCA lesion is 30% stenosed.   2nd Mrg lesion is 10% stenosed.   Prox Cx to Mid Cx lesion is 40% stenosed.   Mid LAD lesion is 50% stenosed.   The left ventricular systolic function is normal.   LV end diastolic pressure is normal.   The left ventricular ejection fraction is greater than 65% by visual  estimate.   There is no mitral valve regurgitation.   The LAD is a large caliber vessel that courses to the apex. The mid vessel has a moderate stenosis that does not appear to be flow limiting. The apical LAD has severe disease, too small and distal for PCI.  The Circumflex is a large caliber vessel that terminates into an obtuse marginal branch. The stent  in the obtuse marginal branch is patent with minimal restenosis.  The RCA is a large dominant vessel with a moderate proximal stenosis, patent mid segment stent with mild to moderate restenosis. This does not appear to be flow limiting.  Normal LV systolic function Normal LVEDP   Recommendations: Continue medical management of CAD. Consider addition of long acting nitrate if he has recurrent chest discomfort.   Coronary Diagrams  Diagnostic Dominance: Right     MPI 2019 Narrative & Impression  No diagnostic ST segment changes to indicate ischemia. Small, mild intensity, apical to basal inferior defect that is fixed and consistent with soft tissue attenuation given normal wall motion. No large ischemic territories noted. This is a low risk study. Nuclear stress EF: 55%.      EKG:  EKG is ordered today.  The ekg ordered today demonstrates NSR, 72bpm, TWI aVL (not new)  Recent Labs: No results found for requested labs within last 365 days.  Recent Lipid Panel    Component Value Date/Time   CHOL 97 05/08/2022 0120   TRIG 164 (H) 05/08/2022 0120   HDL 19 (L) 05/08/2022 0120   CHOLHDL 5.1 05/08/2022 0120   VLDL 33 05/08/2022 0120   LDLCALC 45 05/08/2022 0120     Physical Exam:    VS:  BP (!) 142/80   Pulse 76   Ht 5\' 10"  (1.778 m)   Wt 217 lb 9.6 oz (98.7 kg)   SpO2 98%   BMI 31.22 kg/m     Wt Readings from Last 3 Encounters:  Fields/23/24 217 lb 9.6 oz (98.7 kg)  11/20/22 203 lb 6.4 oz (92.3 kg)  05/26/22 215 lb 3.2 oz (97.6 kg)     GEN:  Well nourished, well developed in no acute distress HEENT: Normal NECK: No  JVD; No carotid bruits LYMPHATICS: No lymphadenopathy CARDIAC: RRR, no murmurs, rubs, gallops RESPIRATORY:  Clear to auscultation without rales, wheezing or rhonchi  ABDOMEN: Soft, non-tender, non-distended MUSCULOSKELETAL:  No edema; No deformity  SKIN: Warm and dry NEUROLOGIC:  Alert and oriented x 3 PSYCHIATRIC:  Normal affect   ASSESSMENT:    1. Pre-operative cardiovascular examination   2. Chest pain, unspecified type   3. Coronary artery disease involving native coronary artery of native heart with angina pectoris (HCC)   4. Essential hypertension   5. Claudication in peripheral vascular disease (HCC)    PLAN:    In order of problems listed above:  Preoperative cardiac evaluation CAD status post remote stenting Hypertension Bilateral claudication Patient is needing a thyroid surgery, scheduled for September 07, 2023.  Patient reports 1 episode of chest pain back in March 2024, both typical and atypical symptoms.  EKG shows no new ischemic changes.  I will order a Myoview Lexiscan.  Patient also reports bilateral claudication when walking far distances.  I will order bilateral ABIs.  Blood pressure is mildly elevated today, reports he was recently started on losartan.  METs greater than 4.  According to RCRI patient is class II risk, 6% 30-day risk of death, MI, or cardiac arrest.  If Myoview Lexi scan is low risk, okay to pursue surgery.  We will evaluate blood pressure and claudication at follow-up.  Regarding ASA therapy, we recommend continuation of ASA throughout the perioperative period.  However, if the surgeon feels that cessation of ASA is required in the perioperative period, it may be stopped 5-7 days prior to surgery with a plan to resume it as soon as felt to be feasible  from a surgical standpoint in the post-operative period.   Disposition: Follow up in 6 week(s) with MD/APP   Shared Decision Making/Informed Consent   Informed Consent   Shared Decision  Making/Informed Consent The risks [chest pain, shortness of breath, cardiac arrhythmias, dizziness, blood pressure fluctuations, myocardial infarction, stroke/transient ischemic attack, nausea, vomiting, allergic reaction, radiation exposure, metallic taste sensation and life-threatening complications (estimated to be 1 in 10,000)], benefits (risk stratification, diagnosing coronary artery disease, treatment guidance) and alternatives of a nuclear stress test were discussed in detail with Mr. Daniel Fields and he agrees to proceed.       Signed, Cortavius Montesinos David Stall, PA-C  08/24/2023 2:19 PM     [facility]

## 2023-08-25 ENCOUNTER — Encounter (HOSPITAL_BASED_OUTPATIENT_CLINIC_OR_DEPARTMENT_OTHER)
Admission: RE | Admit: 2023-08-25 | Discharge: 2023-08-25 | Disposition: A | Discharge status: Home / Self Care | Payer: Medicare Other | Source: Ambulatory Visit | Attending: Medical | Admitting: Medical

## 2023-08-25 ENCOUNTER — Ambulatory Visit (HOSPITAL_COMMUNITY)
Admission: RE | Admit: 2023-08-25 | Discharge: 2023-08-25 | Disposition: A | Discharge status: Home / Self Care | Payer: Medicare Other | Source: Ambulatory Visit | Attending: Medical | Admitting: Medical

## 2023-08-25 DIAGNOSIS — K219 Gastro-esophageal reflux disease without esophagitis: Secondary | ICD-10-CM | POA: Diagnosis not present

## 2023-08-25 DIAGNOSIS — R079 Chest pain, unspecified: Secondary | ICD-10-CM

## 2023-08-25 DIAGNOSIS — I739 Peripheral vascular disease, unspecified: Secondary | ICD-10-CM | POA: Diagnosis not present

## 2023-08-25 DIAGNOSIS — I209 Angina pectoris, unspecified: Secondary | ICD-10-CM | POA: Diagnosis not present

## 2023-08-25 DIAGNOSIS — I10 Essential (primary) hypertension: Secondary | ICD-10-CM | POA: Insufficient documentation

## 2023-08-25 DIAGNOSIS — Z01810 Encounter for preprocedural cardiovascular examination: Secondary | ICD-10-CM | POA: Diagnosis not present

## 2023-08-25 DIAGNOSIS — I25119 Atherosclerotic heart disease of native coronary artery with unspecified angina pectoris: Secondary | ICD-10-CM | POA: Diagnosis not present

## 2023-08-25 LAB — NM MYOCAR MULTI W/SPECT W/WALL MOTION / EF
LV dias vol: 102 mL (ref 62–150)
LV sys vol: 38 mL
Nuc Stress EF: 63 %
Peak HR: 86 {beats}/min
RATE: 0.5
Rest HR: 60 {beats}/min
Rest Nuclear Isotope Dose: 10 mCi
SDS: 2
SRS: 1
SSS: 3
ST Depression (mm): 0 mm
Stress Nuclear Isotope Dose: 28 mCi
TID: 1.15

## 2023-08-25 MED ORDER — REGADENOSON 0.4 MG/5ML IV SOLN
INTRAVENOUS | Status: AC
  Administered 2023-08-25: 0.4 mg via INTRAVENOUS
  Filled 2023-08-25: qty 5

## 2023-08-25 MED ORDER — TECHNETIUM TC 99M TETROFOSMIN IV KIT
30.0000 | PACK | Freq: Once | INTRAVENOUS | Status: AC | PRN
  Administered 2023-08-25: 28 via INTRAVENOUS

## 2023-08-25 MED ORDER — TECHNETIUM TC 99M TETROFOSMIN IV KIT
10.0000 | PACK | Freq: Once | INTRAVENOUS | Status: AC | PRN
  Administered 2023-08-25: 10 via INTRAVENOUS

## 2023-08-25 MED ORDER — SODIUM CHLORIDE FLUSH 0.9 % IV SOLN
INTRAVENOUS | Status: AC
  Administered 2023-08-25: 10 mL via INTRAVENOUS
  Filled 2023-08-25: qty 10

## 2023-08-28 ENCOUNTER — Ambulatory Visit (HOSPITAL_COMMUNITY)
Admission: RE | Admit: 2023-08-28 | Discharge: 2023-08-28 | Disposition: A | Discharge status: Home / Self Care | Payer: Medicare Other | Source: Ambulatory Visit | Attending: Medical | Admitting: Medical

## 2023-08-28 DIAGNOSIS — I739 Peripheral vascular disease, unspecified: Secondary | ICD-10-CM | POA: Diagnosis not present

## 2023-09-01 DIAGNOSIS — K080 Exfoliation of teeth due to systemic causes: Secondary | ICD-10-CM | POA: Diagnosis not present

## 2023-09-01 NOTE — H&P (Signed)
HPI:   Daniel Fields is a 67 y.o. male who presents as a new Patient.   Referring Provider: Self, Referral  Chief complaint: Neck mass.  HPI: He noticed a lump in his neck over a year ago. Its to the left of midline. He has noticed a couple of other wounds in the area as well. He has some intermittent pain in that area that shoots up into the ear and temple area. He is a smoker. He has chronic hoarseness. He drinks 1 or 2 cups of coffee daily and soda sometimes.  PMH/Meds/All/SocHx/FamHx/ROS:   Past Medical History:  Diagnosis Date  Arthritis  Cancer (CMS/HCC)  Coronary artery disease   Past Surgical History:  Procedure Laterality Date  SPINE SURGERY  Procedure: SPINE SURGERY   No family history of bleeding disorders, wound healing problems or difficulty with anesthesia.     Current Outpatient Medications:  allopurinoL (ZYLOPRIM) 300 mg tablet, TAKE 1 TABLET BY MOUTH DAILY, Disp: , Rfl:  aspirin 81 mg EC tablet, Take 81 mg by mouth., Disp: , Rfl:  atorvastatin (LIPITOR) 10 mg tablet, TAKE 1 TABLET BY MOUTH EVERY DAY, Disp: , Rfl:  dilTIAZem (CARDIZEM CD) 180 mg 24 hr capsule, TAKE ONE CAPSULE BY MOUTH EVERY DAY, Disp: , Rfl:  folic acid (FOLVITE) 1 mg tablet, TAKE 1 TABLET BY MOUTH EVERY DAY, Disp: , Rfl:  HYDROcodone-acetaminophen (NORCO) 7.5-325 mg per tablet, TAKE 1 TABLET BY MOUTH FOUR TIMES DAILY AS NEEDED FOR MODERATE TO SEVERE PAIN, Disp: , Rfl:  hydrocortisone 2.5 % cream, , Disp: , Rfl:  ketoconazole (NIZORAL) 2 % cream, , Disp: , Rfl:  losartan (COZAAR) 100 mg tablet, Take 1 tablet by mouth daily., Disp: , Rfl:  methocarbamoL (ROBAXIN) 500 mg tablet, , Disp: , Rfl:  nitroglycerin (NITROSTAT) 0.4 mg SL tablet, Place 0.4 mg under the tongue., Disp: , Rfl:  pantoprazole (PROTONIX) 40 mg EC tablet, TAKE 1 TABLET BY MOUTH EVERY DAY, Disp: , Rfl:  potassium chloride (KLOR-CON) 20 mEq ER tablet, TAKE 1 TABLET BY MOUTH TWICE DAILY, Disp: , Rfl:  ramipriL (ALTACE) 10 mg  capsule, TAKE 1 CAPSULE BY MOUTH EVERY DAY, Disp: , Rfl:   A complete ROS was performed with pertinent positives/negatives noted in the HPI. The remainder of the ROS are negative.   Physical Exam:   Temp 97.7 F (36.5 C) (Temporal)  Resp 18  Ht 1.778 m (5\' 10" )  Wt 99 kg (218 lb 3.2 oz)  BMI 31.31 kg/m   General: Healthy and alert, in no distress, breathing easily. Normal affect. In a pleasant mood. Head: Normocephalic, atraumatic. No masses, or scars. Eyes: Pupils are equal, and reactive to light. Vision is grossly intact. No spontaneous or gaze nystagmus. Ears: Ear canals are clear. Tympanic membranes are intact, with normal landmarks and the middle ears are clear and healthy. Hearing: Grossly normal. Nose: Nasal cavities are clear with healthy mucosa, no polyps or exudate. Airways are patent. Face: No masses or scars, facial nerve function is symmetric. Oral Cavity: No mucosal abnormalities are noted. Tongue with normal mobility. Dentition appears healthy. Oropharynx: Tonsils are symmetric. There are no mucosal masses identified. Tongue base appears normal and healthy. Larynx/Hypopharynx: Cords are mobile. Cords are edematous. There are no mucosal lesions identified in the larynx or hypopharynx and the tongue base is normal and symmetric. The tongue is tobacco stained. Chest: Deferred Neck: There is a small mass just above the thyroid notch slightly to the left of midline. No other palpable masses, no cervical  adenopathy, no thyroid nodules or enlargement. The areas that correspond to where he felt the lumps are normal anatomic structures. Neuro: Cranial nerves II-XII with normal function. Balance: Normal gate. Other findings: none.  Independent Review of Additional Tests or Records:  CT neck:  IMPRESSION:  1. 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 No evidence  of superinfection or other complicating features at this time. A  nearby left anterior thyroidal vein  is marked as the palpable area  of concern. No other neck mass. No lymphadenopathy.   2. 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 Carotid and Aortic Atherosclerosis   Procedures:  none  Impression & Plans:  Small thyroglossal duct cyst. Recommend surgical excision of this. We discussed that this would not help any of his symptoms as his symptoms or not related to this. The reason to remove this is because of the potential for malignancy.  His symptoms are likely musculoskeletal. He does have a history of arthritis.  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 Recommend he stop smoking.

## 2023-09-03 ENCOUNTER — Encounter (HOSPITAL_COMMUNITY): Payer: Self-pay | Admitting: Otolaryngology

## 2023-09-03 ENCOUNTER — Other Ambulatory Visit: Payer: Self-pay

## 2023-09-03 NOTE — Progress Notes (Addendum)
PCP - Benita Stabile, MD Cardiologist - Jonelle Sidle, MD  PPM/ICD - denies Device Orders - n/a Rep Notified - n/a  Chest x-ray -  EKG - 08-24-23 Stress Test - 08-24-23 ECHO - 05-04-2006 Cardiac Cath - 05-07-22  CPAP - Per patient had sleep study 10+ years ago was given a machine at that time but does not wear it  DM- denies  Blood Thinner Instructions:  n/a Aspirin Instructions: aspirin EC Instructed patient to reach to surgeon for further instructions regarding asa  ERAS Protcol - NPO  COVID TEST- n/  Anesthesia review: Yes, Hx of HTN,COPD, OSA, Recent cardiology visit  Patient verbally denies any shortness of breath, fever, cough and chest pain during phone call   -------------  SDW INSTRUCTIONS given:  Your procedure is scheduled on September 07, 2023.  Report to [facility] Main Entrance "A" at 11:00 A.M., and check in at the Admitting office.  Call this number if you have problems the morning of surgery:  [phone]   Remember:  Do not eat  or drink after midnight the night before your surgery      Take these medicines the morning of surgery with A SIP OF WATER  allopurinol (ZYLOPRIM)   IF NEEDED HYDROcodone-acetaminophen (NORCO)  nitroGLYCERIN (NITROSTAT)  Please call [phone] if used prior to procedure   As of today, STOP taking any Aspirin (unless otherwise instructed by your surgeon) Aleve, Naproxen, Ibuprofen, Motrin, Advil, Goody's, BC's, all herbal medications, fish oil, and all vitamins.                      Do not wear jewelry, make up, or nail polish            Do not wear lotions, powders, perfumes/colognes, or deodorant.            Do not shave 48 hours prior to surgery.  Men may shave face and neck.            Do not bring valuables to the hospital.            [facility] is not responsible for any belongings or valuables.  Do NOT Smoke (Tobacco/Vaping) 24 hours prior to your procedure If you use a CPAP at night, you may bring all equipment  for your overnight stay.   Contacts, glasses, dentures or bridgework may not be worn into surgery.      For patients admitted to the hospital, discharge time will be determined by your treatment team.   Patients discharged the day of surgery will not be allowed to drive home, and someone needs to stay with them for 24 hours.    Special instructions:   [place]- Preparing For Surgery  Before surgery, you can play an important role. Because skin is not sterile, your skin needs to be as free of germs as possible. You can reduce the number of germs on your skin by washing with CHG (chlorahexidine gluconate) Soap before surgery.  CHG is an antiseptic cleaner which kills germs and bonds with the skin to continue killing germs even after washing.    Oral Hygiene is also important to reduce your risk of infection.  Remember - BRUSH YOUR TEETH THE MORNING OF SURGERY WITH YOUR REGULAR TOOTHPASTE  Please do not use if you have an allergy to CHG or antibacterial soaps. If your skin becomes reddened/irritated stop using the CHG.  Do not shave (including legs and underarms) for at least 48  hours prior to first CHG shower. It is OK to shave your face.  Please follow these instructions carefully.   Shower the NIGHT BEFORE SURGERY and the MORNING OF SURGERY with DIAL Soap.   Pat yourself dry with a CLEAN TOWEL.  Wear CLEAN PAJAMAS to bed the night before surgery  Place CLEAN SHEETS on your bed the night of your first shower and DO NOT SLEEP WITH PETS.   Day of Surgery: Please shower morning of surgery  Wear Clean/Comfortable clothing the morning of surgery Do not apply any deodorants/lotions.   Remember to brush your teeth WITH YOUR REGULAR TOOTHPASTE.   Questions were answered. Patient verbalized understanding of instructions.

## 2023-09-03 NOTE — Progress Notes (Signed)
Anesthesia Chart Review:  67 year old male with pertinent history of CAD s/p prior circumflex and RCA stenting in 2004, HTN, HLD, OSA.  In June 2023 who presented to [facility] with chest pain and ruled out for ACS.  Diagnostic cath showed moderate, diffuse, nonobstructive CAD with normal LV function.  Medical management recommended.  He was seen by Cadence Furth, PA-C on 08/24/2023 for preop evaluation.  Per note, "Patient is needing a thyroid surgery, scheduled for September 07, 2023.  Patient reports 1 episode of chest pain back in March 2024, both typical and atypical symptoms.  EKG shows no new ischemic changes.  I will order a Myoview Lexiscan.  Patient also reports bilateral claudication when walking far distances.  I will order bilateral ABIs.  Blood pressure is mildly elevated today, reports he was recently started on losartan.  METs greater than 4.  According to RCRI patient is class II risk, 6% 30-day risk of death, MI, or cardiac arrest.  If Myoview Lexi scan is low risk, okay to pursue surgery.  We will evaluate blood pressure and claudication at follow-up. Regarding ASA therapy, we recommend continuation of ASA throughout the perioperative period.  However, if the surgeon feels that cessation of ASA is required in the perioperative period, it may be stopped 5-7 days prior to surgery with a plan to resume it as soon as felt to be feasible from a surgical standpoint in the post-operative period."  Myoview 08/25/2023 was low risk.  Current smoker with associated COPD.  Not on any daily inhaled medications.  Patient will need day of surgery labs and evaluation.  EKG 08/24/2023: NSR, possible lead reversal, rate 72.  Nuclear stress 08/25/2023:   The study is normal. There are no perfusion defects consistent with prior infarct or current ischemia. The study is low risk.   No ST deviation was noted.   Large mild intensity inferior/inferoseptal defect most prominent in resting images with normal wall motion  consistent with diaphragmatic attenuation.   Left ventricular function is normal. End diastolic cavity size is normal.  Cath 05/07/2022:   Prox RCA lesion is 60% stenosed.   Prox RCA to Mid RCA lesion is 40% stenosed.   Dist RCA lesion is 30% stenosed.   2nd Mrg lesion is 10% stenosed.   Prox Cx to Mid Cx lesion is 40% stenosed.   Mid LAD lesion is 50% stenosed.   The left ventricular systolic function is normal.   LV end diastolic pressure is normal.   The left ventricular ejection fraction is greater than 65% by visual estimate.   There is no mitral valve regurgitation.   The LAD is a large caliber vessel that courses to the apex. The mid vessel has a moderate stenosis that does not appear to be flow limiting. The apical LAD has severe disease, too small and distal for PCI.  The Circumflex is a large caliber vessel that terminates into an obtuse marginal branch. The stent in the obtuse marginal branch is patent with minimal restenosis.  The RCA is a large dominant vessel with a moderate proximal stenosis, patent mid segment stent with mild to moderate restenosis. This does not appear to be flow limiting.  Normal LV systolic function Normal LVEDP   Recommendations: Continue medical management of CAD. Consider addition of long acting nitrate if he has recurrent chest discomfort.     Zannie Cove [facility] Short Stay Center/Anesthesiology Phone [phone] 09/03/2023 10:19 AM

## 2023-09-03 NOTE — Anesthesia Preprocedure Evaluation (Addendum)
Anesthesia Evaluation  Patient identified by MRN, date of birth, ID band Patient awake    Reviewed: Allergy & Precautions, NPO status , Patient's Chart, lab work & pertinent test results  History of Anesthesia Complications Negative for: history of anesthetic complications  Airway Mallampati: II  TM Distance: >3 FB Neck ROM: Full    Dental  (+) Missing,    Pulmonary COPD, Current Smoker and Patient abstained from smoking.   Pulmonary exam normal        Cardiovascular hypertension, Pt. on medications (-) angina + CAD and + Cardiac Stents (2004)  Normal cardiovascular exam     Neuro/Psych    Depression       GI/Hepatic Neg liver ROS,GERD  Medicated and Controlled,,  Endo/Other  negative endocrine ROS    Renal/GU negative Renal ROS  negative genitourinary   Musculoskeletal  (+) Arthritis ,    Abdominal   Peds  Hematology negative hematology ROS (+)   Anesthesia Other Findings Day of surgery medications reviewed with patient.  Reproductive/Obstetrics negative OB ROS                              Anesthesia Physical Anesthesia Plan  ASA: 3  Anesthesia Plan: General   Post-op Pain Management: Minimal or no pain anticipated   Induction: Intravenous  PONV Risk Score and Plan: 2 and Treatment may vary due to age or medical condition, Ondansetron, Dexamethasone and Midazolam  Airway Management Planned: Oral ETT  Additional Equipment: None  Intra-op Plan:   Post-operative Plan: Extubation in OR  Informed Consent: I have reviewed the patients History and Physical, chart, labs and discussed the procedure including the risks, benefits and alternatives for the proposed anesthesia with the patient or authorized representative who has indicated his/her understanding and acceptance.     Dental advisory given  Plan Discussed with: CRNA  Anesthesia Plan Comments: (PAT note by Antionette Poles, PA-C: 67 year old male with pertinent history of CAD s/p prior circumflex and RCA stenting in 2004, HTN, HLD, OSA.  In June 2023 who presented to [facility] with chest pain and ruled out for ACS.  Diagnostic cath showed moderate, diffuse, nonobstructive CAD with normal LV function.  Medical management recommended.  He was seen by Cadence Furth, PA-C on 08/24/2023 for preop evaluation.  Per note, "Patient is needing a thyroid surgery, scheduled for September 07, 2023.  Patient reports 1 episode of chest pain back in March 2024, both typical and atypical symptoms.  EKG shows no new ischemic changes.  I will order a Myoview Lexiscan.  Patient also reports bilateral claudication when walking far distances.  I will order bilateral ABIs.  Blood pressure is mildly elevated today, reports he was recently started on losartan.  METs greater than 4.  According to RCRI patient is class II risk, 6% 30-day risk of death, MI, or cardiac arrest.  If Myoview Lexi scan is low risk, okay to pursue surgery.  We will evaluate blood pressure and claudication at follow-up. Regarding ASA therapy, we recommend continuation of ASA throughout the perioperative period.  However, if the surgeon feels that cessation of ASA is required in the perioperative period, it may be stopped 5-7 days prior to surgery with a plan to resume it as soon as felt to be feasible from a surgical standpoint in the post-operative period."  Myoview 08/25/2023 was low risk.  Current smoker with associated COPD.  Not on any daily inhaled medications.  Patient  will need day of surgery labs and evaluation.  EKG 08/24/2023: NSR, possible lead reversal, rate 72.  Nuclear stress 08/25/2023:   The study is normal. There are no perfusion defects consistent with prior infarct or current ischemia. The study is low risk.   No ST deviation was noted.   Large mild intensity inferior/inferoseptal defect most prominent in resting images with normal wall motion consistent  with diaphragmatic attenuation.   Left ventricular function is normal. End diastolic cavity size is normal.  Cath 05/07/2022:   Prox RCA lesion is 60% stenosed.   Prox RCA to Mid RCA lesion is 40% stenosed.   Dist RCA lesion is 30% stenosed.   2nd Mrg lesion is 10% stenosed.   Prox Cx to Mid Cx lesion is 40% stenosed.   Mid LAD lesion is 50% stenosed.   The left ventricular systolic function is normal.   LV end diastolic pressure is normal.   The left ventricular ejection fraction is greater than 65% by visual estimate.   There is no mitral valve regurgitation.   1. The LAD is a large caliber vessel that courses to the apex. The mid vessel has a moderate stenosis that does not appear to be flow limiting. The apical LAD has severe disease, too small and distal for PCI.  2. The Circumflex is a large caliber vessel that terminates into an obtuse marginal branch. The stent in the obtuse marginal branch is patent with minimal restenosis.  3. The RCA is a large dominant vessel with a moderate proximal stenosis, patent mid segment stent with mild to moderate restenosis. This does not appear to be flow limiting.  4. Normal LV systolic function 5. Normal LVEDP   Recommendations: Continue medical management of CAD. Consider addition of long acting nitrate if he has recurrent chest discomfort.   )         Anesthesia Quick Evaluation

## 2023-09-07 ENCOUNTER — Ambulatory Visit (HOSPITAL_COMMUNITY)
Admission: RE | Admit: 2023-09-07 | Discharge: 2023-09-07 | Disposition: A | Discharge status: Home / Self Care | Payer: Medicare Other | Attending: Otolaryngology | Admitting: Otolaryngology

## 2023-09-07 ENCOUNTER — Ambulatory Visit (HOSPITAL_COMMUNITY): Payer: Medicare Other | Admitting: Physician Assistant

## 2023-09-07 ENCOUNTER — Ambulatory Visit (HOSPITAL_BASED_OUTPATIENT_CLINIC_OR_DEPARTMENT_OTHER): Payer: Medicare Other | Admitting: Physician Assistant

## 2023-09-07 ENCOUNTER — Other Ambulatory Visit: Payer: Self-pay

## 2023-09-07 ENCOUNTER — Encounter (HOSPITAL_COMMUNITY)
Admission: RE | Disposition: A | Discharge status: Home / Self Care | Payer: Self-pay | Source: Home / Self Care | Attending: Otolaryngology

## 2023-09-07 DIAGNOSIS — Z955 Presence of coronary angioplasty implant and graft: Secondary | ICD-10-CM | POA: Diagnosis not present

## 2023-09-07 DIAGNOSIS — I700 Atherosclerosis of aorta: Secondary | ICD-10-CM | POA: Insufficient documentation

## 2023-09-07 DIAGNOSIS — J449 Chronic obstructive pulmonary disease, unspecified: Secondary | ICD-10-CM | POA: Diagnosis not present

## 2023-09-07 DIAGNOSIS — I2510 Atherosclerotic heart disease of native coronary artery without angina pectoris: Secondary | ICD-10-CM | POA: Insufficient documentation

## 2023-09-07 DIAGNOSIS — F17210 Nicotine dependence, cigarettes, uncomplicated: Secondary | ICD-10-CM

## 2023-09-07 DIAGNOSIS — F32A Depression, unspecified: Secondary | ICD-10-CM | POA: Insufficient documentation

## 2023-09-07 DIAGNOSIS — M1990 Unspecified osteoarthritis, unspecified site: Secondary | ICD-10-CM | POA: Insufficient documentation

## 2023-09-07 DIAGNOSIS — Q892 Congenital malformations of other endocrine glands: Secondary | ICD-10-CM

## 2023-09-07 DIAGNOSIS — K219 Gastro-esophageal reflux disease without esophagitis: Secondary | ICD-10-CM | POA: Insufficient documentation

## 2023-09-07 DIAGNOSIS — R221 Localized swelling, mass and lump, neck: Secondary | ICD-10-CM | POA: Diagnosis not present

## 2023-09-07 DIAGNOSIS — J370 Chronic laryngitis: Secondary | ICD-10-CM | POA: Diagnosis not present

## 2023-09-07 DIAGNOSIS — F17200 Nicotine dependence, unspecified, uncomplicated: Secondary | ICD-10-CM | POA: Diagnosis not present

## 2023-09-07 DIAGNOSIS — I10 Essential (primary) hypertension: Secondary | ICD-10-CM | POA: Diagnosis not present

## 2023-09-07 HISTORY — PX: THYROGLOSSAL DUCT CYST: SHX297

## 2023-09-07 HISTORY — DX: Gastro-esophageal reflux disease without esophagitis: K21.9

## 2023-09-07 LAB — BASIC METABOLIC PANEL
Anion gap: 10 (ref 5–15)
BUN: 17 mg/dL (ref 8–23)
CO2: 21 mmol/L — ABNORMAL LOW (ref 22–32)
Calcium: 9 mg/dL (ref 8.9–10.3)
Chloride: 106 mmol/L (ref 98–111)
Creatinine, Ser: 1.09 mg/dL (ref 0.61–1.24)
GFR, Estimated: >60 mL/min (ref >60)
Glucose, Bld: 94 mg/dL (ref 70–99)
Potassium: 3.9 mmol/L (ref 3.5–5.1)
Sodium: 137 mmol/L (ref 135–145)

## 2023-09-07 LAB — CBC
HCT: 44.6 % (ref 39.0–52.0)
Hemoglobin: 15.8 g/dL (ref 13.0–17.0)
MCH: 31.5 pg (ref 26.0–34.0)
MCHC: 35.4 g/dL (ref 30.0–36.0)
MCV: 88.8 fL (ref 80.0–100.0)
Platelets: 138 10*3/uL — ABNORMAL LOW (ref 150–400)
RBC: 5.02 MIL/uL (ref 4.22–5.81)
RDW: 13 % (ref 11.5–15.5)
WBC: 6.7 10*3/uL (ref 4.0–10.5)
nRBC: 0 % (ref 0.0–0.2)

## 2023-09-07 SURGERY — EXCISION, THYROGLOSSAL DUCT CYST
Anesthesia: General | Site: Neck

## 2023-09-07 MED ORDER — BACITRACIN ZINC 500 UNIT/GM EX OINT
TOPICAL_OINTMENT | CUTANEOUS | Status: AC
  Filled 2023-09-07: qty 28.35

## 2023-09-07 MED ORDER — LACTATED RINGERS IV SOLN: INTRAVENOUS | Status: DC

## 2023-09-07 MED ORDER — PROPOFOL 10 MG/ML IV BOLUS
INTRAVENOUS | Status: DC | PRN
  Administered 2023-09-07: 150 mg via INTRAVENOUS

## 2023-09-07 MED ORDER — DROPERIDOL 2.5 MG/ML IJ SOLN: 0.6250 mg | Freq: Once | INTRAMUSCULAR | Status: DC | PRN

## 2023-09-07 MED ORDER — KETAMINE HCL 50 MG/5ML IJ SOSY
PREFILLED_SYRINGE | INTRAMUSCULAR | Status: AC
  Filled 2023-09-07: qty 5

## 2023-09-07 MED ORDER — SUCCINYLCHOLINE CHLORIDE 200 MG/10ML IV SOSY
PREFILLED_SYRINGE | INTRAVENOUS | Status: DC | PRN
  Administered 2023-09-07: 100 mg via INTRAVENOUS

## 2023-09-07 MED ORDER — ACETAMINOPHEN 10 MG/ML IV SOLN: 1000.0000 mg | Freq: Once | INTRAVENOUS | Status: DC | PRN

## 2023-09-07 MED ORDER — DEXAMETHASONE SODIUM PHOSPHATE 10 MG/ML IJ SOLN
INTRAMUSCULAR | Status: DC | PRN
  Administered 2023-09-07: 10 mg via INTRAVENOUS

## 2023-09-07 MED ORDER — ORAL CARE MOUTH RINSE: 15.0000 mL | Freq: Once | OROMUCOSAL | Status: AC

## 2023-09-07 MED ORDER — CEPHALEXIN 500 MG PO CAPS: 500.0000 mg | ORAL_CAPSULE | Freq: Three times a day (TID) | ORAL | 0 refills | Status: DC

## 2023-09-07 MED ORDER — OXYCODONE HCL 5 MG/5ML PO SOLN: 5.0000 mg | Freq: Once | ORAL | Status: DC | PRN

## 2023-09-07 MED ORDER — PHENYLEPHRINE 80 MCG/ML (10ML) SYRINGE FOR IV PUSH (FOR BLOOD PRESSURE SUPPORT)
PREFILLED_SYRINGE | INTRAVENOUS | Status: DC | PRN
  Administered 2023-09-07 (×2): 80 ug via INTRAVENOUS
  Administered 2023-09-07: 160 ug via INTRAVENOUS
  Administered 2023-09-07 (×2): 80 ug via INTRAVENOUS

## 2023-09-07 MED ORDER — ONDANSETRON HCL 4 MG/2ML IJ SOLN
INTRAMUSCULAR | Status: DC | PRN
  Administered 2023-09-07: 4 mg via INTRAVENOUS

## 2023-09-07 MED ORDER — FENTANYL CITRATE (PF) 250 MCG/5ML IJ SOLN
INTRAMUSCULAR | Status: AC
  Filled 2023-09-07: qty 5

## 2023-09-07 MED ORDER — PHENYLEPHRINE 80 MCG/ML (10ML) SYRINGE FOR IV PUSH (FOR BLOOD PRESSURE SUPPORT)
PREFILLED_SYRINGE | INTRAVENOUS | Status: AC
  Filled 2023-09-07: qty 10

## 2023-09-07 MED ORDER — FENTANYL CITRATE (PF) 100 MCG/2ML IJ SOLN: 25.0000 ug | INTRAMUSCULAR | Status: DC | PRN

## 2023-09-07 MED ORDER — PROPOFOL 1000 MG/100ML IV EMUL
INTRAVENOUS | Status: AC
  Filled 2023-09-07: qty 100

## 2023-09-07 MED ORDER — PHENYLEPHRINE HCL-NACL 20-0.9 MG/250ML-% IV SOLN
INTRAVENOUS | Status: AC
  Filled 2023-09-07: qty 250

## 2023-09-07 MED ORDER — DEXAMETHASONE SODIUM PHOSPHATE 10 MG/ML IJ SOLN
INTRAMUSCULAR | Status: AC
  Filled 2023-09-07: qty 1

## 2023-09-07 MED ORDER — ROCURONIUM BROMIDE 10 MG/ML (PF) SYRINGE
PREFILLED_SYRINGE | INTRAVENOUS | Status: DC | PRN
  Administered 2023-09-07: 50 mg via INTRAVENOUS

## 2023-09-07 MED ORDER — PROPOFOL 10 MG/ML IV BOLUS
INTRAVENOUS | Status: AC
  Filled 2023-09-07: qty 20

## 2023-09-07 MED ORDER — OXYCODONE HCL 5 MG PO TABS: 5.0000 mg | ORAL_TABLET | Freq: Once | ORAL | Status: DC | PRN

## 2023-09-07 MED ORDER — FENTANYL CITRATE (PF) 250 MCG/5ML IJ SOLN
INTRAMUSCULAR | Status: DC | PRN
  Administered 2023-09-07: 100 ug via INTRAVENOUS
  Administered 2023-09-07: 50 ug via INTRAVENOUS

## 2023-09-07 MED ORDER — MIDAZOLAM HCL 2 MG/2ML IJ SOLN
INTRAMUSCULAR | Status: DC | PRN
  Administered 2023-09-07: 2 mg via INTRAVENOUS

## 2023-09-07 MED ORDER — LIDOCAINE 2% (20 MG/ML) 5 ML SYRINGE
INTRAMUSCULAR | Status: AC
  Filled 2023-09-07: qty 5

## 2023-09-07 MED ORDER — SODIUM CHLORIDE 0.9 % IV SOLN
0.1500 ug/kg/min | Freq: Once | INTRAVENOUS | Status: DC
  Filled 2023-09-07: qty 2000

## 2023-09-07 MED ORDER — SUGAMMADEX SODIUM 200 MG/2ML IV SOLN
INTRAVENOUS | Status: DC | PRN
  Administered 2023-09-07: 200 mg via INTRAVENOUS

## 2023-09-07 MED ORDER — LIDOCAINE-EPINEPHRINE 1 %-1:100000 IJ SOLN
INTRAMUSCULAR | Status: AC
  Filled 2023-09-07: qty 1

## 2023-09-07 MED ORDER — KETAMINE HCL 50 MG/5ML IJ SOSY
PREFILLED_SYRINGE | INTRAMUSCULAR | Status: DC | PRN
Start: 2023-09-07 — End: 2023-09-07
  Administered 2023-09-07: 15 mg via INTRAVENOUS

## 2023-09-07 MED ORDER — LIDOCAINE 2% (20 MG/ML) 5 ML SYRINGE
INTRAMUSCULAR | Status: DC | PRN
  Administered 2023-09-07: 100 mg via INTRAVENOUS

## 2023-09-07 MED ORDER — 0.9 % SODIUM CHLORIDE (POUR BTL) OPTIME
TOPICAL | Status: DC | PRN
Start: 2023-09-07 — End: 2023-09-07
  Administered 2023-09-07: 1000 mL

## 2023-09-07 MED ORDER — CHLORHEXIDINE GLUCONATE 0.12 % MT SOLN
15.0000 mL | Freq: Once | OROMUCOSAL | Status: AC
  Administered 2023-09-07: 15 mL via OROMUCOSAL
  Filled 2023-09-07: qty 15

## 2023-09-07 MED ORDER — MIDAZOLAM HCL 2 MG/2ML IJ SOLN
INTRAMUSCULAR | Status: AC
  Filled 2023-09-07: qty 2

## 2023-09-07 MED ORDER — ROCURONIUM BROMIDE 10 MG/ML (PF) SYRINGE
PREFILLED_SYRINGE | INTRAVENOUS | Status: AC
  Filled 2023-09-07: qty 10

## 2023-09-07 MED ORDER — ONDANSETRON HCL 4 MG/2ML IJ SOLN
INTRAMUSCULAR | Status: AC
  Filled 2023-09-07: qty 2

## 2023-09-07 SURGICAL SUPPLY — 44 items
ADH SKN CLS APL DERMABOND .7 (GAUZE/BANDAGES/DRESSINGS) ×1
ATTRACTOMAT 16X20 MAGNETIC DRP (DRAPES) IMPLANT
BAND INSRT 18 STRL LF DISP RB (MISCELLANEOUS) ×1
BAND RUBBER #18 3X1/16 STRL (MISCELLANEOUS) IMPLANT
BLADE SURG 15 STRL LF DISP TIS (BLADE) IMPLANT
BLADE SURG 15 STRL SS (BLADE)
CANISTER SUCT 3000ML PPV (MISCELLANEOUS) ×2 IMPLANT
CLEANER TIP ELECTROSURG 2X2 (MISCELLANEOUS) ×2 IMPLANT
CNTNR URN SCR LID CUP LEK RST (MISCELLANEOUS) ×2 IMPLANT
CONT SPEC 4OZ STRL OR WHT (MISCELLANEOUS) ×1
CORD BIPOLAR FORCEPS 12FT (ELECTRODE) IMPLANT
COVER SURGICAL LIGHT HANDLE (MISCELLANEOUS) ×2 IMPLANT
DERMABOND ADVANCED .7 DNX12 (GAUZE/BANDAGES/DRESSINGS) IMPLANT
DRAPE HALF SHEET 40X57 (DRAPES) IMPLANT
ELECT COATED BLADE 2.86 ST (ELECTRODE) ×2 IMPLANT
ELECT PAIRED SUBDERMAL (MISCELLANEOUS)
ELECT REM PT RETURN 9FT ADLT (ELECTROSURGICAL) ×1
ELECTRODE PAIRED SUBDERMAL (MISCELLANEOUS) IMPLANT
ELECTRODE REM PT RTRN 9FT ADLT (ELECTROSURGICAL) ×2 IMPLANT
FORCEPS BIPOLAR SPETZLER 8 1.0 (NEUROSURGERY SUPPLIES) IMPLANT
GAUZE SPONGE 4X4 12PLY STRL (GAUZE/BANDAGES/DRESSINGS) IMPLANT
GLOVE ECLIPSE 7.5 STRL STRAW (GLOVE) ×2 IMPLANT
GOWN STRL REUS W/ TWL LRG LVL3 (GOWN DISPOSABLE) ×6 IMPLANT
GOWN STRL REUS W/TWL LRG LVL3 (GOWN DISPOSABLE) ×3
KIT BASIN OR (CUSTOM PROCEDURE TRAY) ×2 IMPLANT
KIT TURNOVER KIT B (KITS) ×2 IMPLANT
NDL PRECISIONGLIDE 27X1.5 (NEEDLE) IMPLANT
NEEDLE PRECISIONGLIDE 27X1.5 (NEEDLE) IMPLANT
NS IRRIG 1000ML POUR BTL (IV SOLUTION) ×2 IMPLANT
PAD ARMBOARD 7.5X6 YLW CONV (MISCELLANEOUS) ×4 IMPLANT
PENCIL FOOT CONTROL (ELECTRODE) ×2 IMPLANT
PROBE NERVBE PRASS .33 (MISCELLANEOUS) IMPLANT
SPECIMEN JAR MEDIUM (MISCELLANEOUS) ×2 IMPLANT
SPONGE INTESTINAL PEANUT (DISPOSABLE) IMPLANT
SUT CHROMIC 3 0 SH 27 (SUTURE) IMPLANT
SUT MNCRL AB 3-0 PS2 18 (SUTURE) ×2 IMPLANT
SUT MNCRL AB 4-0 PS2 18 (SUTURE) IMPLANT
SUT NYLON ETHILON 5-0 P-3 1X18 (SUTURE) IMPLANT
SUT SILK 4 0 REEL (SUTURE) ×2 IMPLANT
TOWEL GREEN STERILE FF (TOWEL DISPOSABLE) ×2 IMPLANT
TRAY ENT MC OR (CUSTOM PROCEDURE TRAY) ×2 IMPLANT
TUBE ENDOTRAC EMG 7X10.2 (MISCELLANEOUS) IMPLANT
TUBE ENDOTRAC EMG 8X11.3 (MISCELLANEOUS) IMPLANT
WATER STERILE IRR 1000ML POUR (IV SOLUTION) ×2 IMPLANT

## 2023-09-07 NOTE — Interval H&P Note (Signed)
History and Physical Interval Note:  09/07/2023 12:39 PM  Daniel Fields  has presented today for surgery, with the diagnosis of Neck mass.  The various methods of treatment have been discussed with the patient and family. After consideration of risks, benefits and other options for treatment, the patient has consented to  Procedure(s): EXCISION OF THYROGLOSSAL DUCT CYST (N/A) as a surgical intervention.  The patient's history has been reviewed, patient examined, no change in status, stable for surgery.  I have reviewed the patient's chart and labs.  Questions were answered to the patient's satisfaction.     Serena Colonel

## 2023-09-07 NOTE — Anesthesia Procedure Notes (Addendum)
Procedure Name: Intubation Date/Time: 09/07/2023 1:39 PM  Performed by: Thomasene Ripple, CRNAPre-anesthesia Checklist: Patient identified, Emergency Drugs available, Suction available, Patient being monitored and Timeout performed Patient Re-evaluated:Patient Re-evaluated prior to induction Oxygen Delivery Method: Circle system utilized Preoxygenation: Pre-oxygenation with 100% oxygen Induction Type: IV induction, Rapid sequence and Cricoid Pressure applied Ventilation: Mask ventilation without difficulty Laryngoscope Size: Glidescope and 4 Grade View: Grade I Tube type: Subglottic suction tube Tube size: 7.5 mm Number of attempts: 1 Airway Equipment and Method: Stylet and Video-laryngoscopy Placement Confirmation: ETT inserted through vocal cords under direct vision, breath sounds checked- equal and bilateral and CO2 detector Secured at: 21 cm Tube secured with: Tape Dental Injury: Teeth and Oropharynx as per pre-operative assessment

## 2023-09-07 NOTE — Discharge Instructions (Signed)
Keep the dressing on overnight.  Remove the dressing tomorrow morning.  There is a small piece of sterile rubber band exiting through the midportion of the incision.  If it does not come out on its own please just pull it out.  It is not attached to anything.  After that just keep a clean dressing on and keep the surgical site clean and dry.

## 2023-09-07 NOTE — Op Note (Signed)
OPERATIVE REPORT  DATE OF SURGERY: 09/07/2023  PATIENT:  Daniel Fields,  67 y.o. 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 Following induction of general endotracheal anesthesia, the neck was prepped and draped in standard fashion.  A transverse incision was outlined with a marking pen at about the level of the superior aspect of the thyroid cartilage.  Electrocautery was used to incise the skin and subcutaneous tissue.  Self-retaining retractor was used for the majority of the case.  Strap muscles were reflected and dissection down to the cystic mass was accomplished.  The cyst was identified just at the thyroid notch slightly left of midline.  This was dissected free of surrounding tissue.  The larynx was not entered.  The cyst was followed up superiorly and seem to be attached to the undersurface of the hyoid.  The midportion of the hyoid bone was then skeletonized and cut on both sides using bone cutting rongeurs.  The soft tissue superior to the hyoid did not appear to have any identifiable cyst or tract.  During the dissection the cyst ruptured and there was thick mucoid material within.  The wound was irrigated with saline.  Electrocautery was used for completion of hemostasis.  Rubber band drain was left in the wound and exited through the incision.  Interrupted 3-0 chromic was used to reapproximate the strap muscles and to perform a subcuticular closure.  Dermabond was used on the skin.  A dressing was applied.  Patient was awakened  extubated and transferred to recovery in stable condition.    PATIENT DISPOSITION:  To PACU, stable

## 2023-09-07 NOTE — Transfer of Care (Signed)
Immediate Anesthesia Transfer of Care Note  Patient: Daniel Fields  Procedure(s) Performed: EXCISION OF THYROGLOSSAL DUCT CYST (Neck)  Patient Location: PACU  Anesthesia Type:General  Level of Consciousness: awake, alert , oriented, and patient cooperative  Airway & Oxygen Therapy: Patient Spontanous Breathing and Patient connected to face mask oxygen  Post-op Assessment: Report given to RN, Post -op Vital signs reviewed and stable, and Patient moving all extremities  Post vital signs: Reviewed  Last Vitals:  Vitals Value Taken Time  BP 160/75 09/07/23 1448  Temp 36.4 C 09/07/23 1448  Pulse 67 09/07/23 1452  Resp 18 09/07/23 1452  SpO2 100 % 09/07/23 1452  Vitals shown include unfiled device data.  Last Pain:  Vitals:   09/07/23 1448  TempSrc:   PainSc: 0-No pain         Complications: No notable events documented.

## 2023-09-07 NOTE — Anesthesia Postprocedure Evaluation (Signed)
Anesthesia Post Note  Patient: Daniel Fields  Procedure(s) Performed: EXCISION OF THYROGLOSSAL DUCT CYST (Neck)     Patient location during evaluation: PACU Anesthesia Type: General Level of consciousness: awake and alert Pain management: pain level controlled Vital Signs Assessment: post-procedure vital signs reviewed and stable Respiratory status: spontaneous breathing, nonlabored ventilation and respiratory function stable Cardiovascular status: blood pressure returned to baseline Postop Assessment: no apparent nausea or vomiting Anesthetic complications: no   No notable events documented.  Last Vitals:  Vitals:   09/07/23 1515 09/07/23 1518  BP: (!) 154/87 (!) 161/96  Pulse: 64 62  Resp: 15 12  Temp:  36.5 C  SpO2: 96% 97%    Last Pain:  Vitals:   09/07/23 1515  TempSrc:   PainSc: 0-No pain                 Shanda Howells

## 2023-09-08 ENCOUNTER — Encounter (HOSPITAL_COMMUNITY): Payer: Self-pay | Admitting: Otolaryngology

## 2023-09-09 LAB — SURGICAL PATHOLOGY

## 2023-09-18 DIAGNOSIS — Q892 Congenital malformations of other endocrine glands: Secondary | ICD-10-CM | POA: Diagnosis not present

## 2023-10-02 DIAGNOSIS — K080 Exfoliation of teeth due to systemic causes: Secondary | ICD-10-CM | POA: Diagnosis not present

## 2023-10-05 DIAGNOSIS — K080 Exfoliation of teeth due to systemic causes: Secondary | ICD-10-CM | POA: Diagnosis not present

## 2023-10-06 ENCOUNTER — Ambulatory Visit: Payer: Medicare Other | Attending: Student | Admitting: Student

## 2023-10-06 ENCOUNTER — Encounter: Payer: Self-pay | Admitting: Student

## 2023-10-06 VITALS — BP 144/82 | HR 84 | Ht 70.0 in | Wt 219.0 lb

## 2023-10-06 DIAGNOSIS — I739 Peripheral vascular disease, unspecified: Secondary | ICD-10-CM

## 2023-10-06 DIAGNOSIS — I25118 Atherosclerotic heart disease of native coronary artery with other forms of angina pectoris: Secondary | ICD-10-CM

## 2023-10-06 DIAGNOSIS — R002 Palpitations: Secondary | ICD-10-CM

## 2023-10-06 DIAGNOSIS — I10 Essential (primary) hypertension: Secondary | ICD-10-CM | POA: Diagnosis not present

## 2023-10-06 DIAGNOSIS — E785 Hyperlipidemia, unspecified: Secondary | ICD-10-CM

## 2023-10-06 DIAGNOSIS — G4733 Obstructive sleep apnea (adult) (pediatric): Secondary | ICD-10-CM

## 2023-10-06 NOTE — Patient Instructions (Signed)
Medication Instructions:  Your physician recommends that you continue on your current medications as directed. Please refer to the Current Medication list given to you today.  *If you need a refill on your cardiac medications before your next appointment, please call your pharmacy*   Lab Work: NONE   If you have labs (blood work) drawn today and your tests are completely normal, you will receive your results only by: MyChart Message (if you have MyChart) OR A paper copy in the mail If you have any lab test that is abnormal or we need to change your treatment, we will call you to review the results.   Testing/Procedures: NONE    Follow-Up: At [facility], you and your health needs are our priority.  As part of our continuing mission to provide you with exceptional heart care, we have created designated Provider Care Teams.  These Care Teams include your primary Cardiologist (physician) and Advanced Practice Providers (APPs -  Physician Assistants and Nurse Practitioners) who all work together to provide you with the care you need, when you need it.  We recommend signing up for the patient portal called "MyChart".  Sign up information is provided on this After Visit Summary.  MyChart is used to connect with patients for Virtual Visits (Telemedicine).  Patients are able to view lab/test results, encounter notes, upcoming appointments, etc.  Non-urgent messages can be sent to your provider as well.   To learn more about what you can do with MyChart, go to [email or web address].    Your next appointment:   6 month(s)  Provider:   You may see Samuel McDowell, MD or one of the following Advanced Practice Providers on your designated Care Team:   Brittany Strader, PA-C  Michele Lenze, PA-C     Other Instructions Thank you for choosing  [facility]!    

## 2023-10-06 NOTE — Progress Notes (Signed)
Cardiology Office Note    Date:  10/06/2023  ID:  Daniel Fields, Daniel Fields [birth year], MRN [id number] Cardiologist: Nona Dell, MD    History of Present Illness:    Daniel Fields is a 67 y.o. male with past medical history of CAD (s/p stenting to LCx and RCA in 2004, cath in 05/2022 showing patent stents with severe apical LAD disease which was too small for PCI and moderate proximal RCA stenosis with medical management recommended), HTN, HLD and OSA who presents to the office today for 6-week follow-up.  He was last examined by Cadence Furth, PA in 08/2023 for preoperative cardiac clearance prior to upcoming thyroid surgery. He did report an episode of chest pain in 01/2023 but denied any recurrent symptoms since. A Lexiscan Myoview was recommended for ischemic evaluation prior to his surgery and he also reported claudication, therefore bilateral ABI's were recommended as well. His NST showed no evidence of ischemia and was overall a low-risk study. ABI's were also within a normal range as well when checked in 08/2023. 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  He did undergo excision of a thyroglossal duct cyst in 09/2023 with no immediate complications noted by review of the chart.  In talking with the patient today, he reports overall doing well since his recent surgery and denies any complications with this. He reports having stable angina which occurs with walking for longer distances and this has not increased in frequency or severity. Symptoms typically resolve within 1 to 2 minutes with rest and he has not utilized sublingual nitroglycerin. Reports this has been stable for several years. Does have dyspnea on exertion as well but says this has improved with reducing his tobacco use (currently smoking approximately 1 pack/day). No specific orthopnea, PND or pitting edema. Reports occasional palpitations but symptoms spontaneously  resolve within a few seconds.   Studies Reviewed:   EKG: Not ordered today.   Cardiac Catheterization: 05/2022   Prox RCA lesion is 60% stenosed.   Prox RCA to Mid RCA lesion is 40% stenosed.   Dist RCA lesion is 30% stenosed.   2nd Mrg lesion is 10% stenosed.   Prox Cx to Mid Cx lesion is 40% stenosed.   Mid LAD lesion is 50% stenosed.   The left ventricular systolic function is normal.   LV end diastolic pressure is normal.   The left ventricular ejection fraction is greater than 65% by visual estimate.   There is no mitral valve regurgitation.   The LAD is a large caliber vessel that courses to the apex. The mid vessel has a moderate stenosis that does not appear to be flow limiting. The apical LAD has severe disease, too small and distal for PCI.  The Circumflex is a large caliber vessel that terminates into an obtuse marginal branch. The stent in the obtuse marginal branch is patent with minimal restenosis.  The RCA is a large dominant vessel with a moderate proximal stenosis, patent mid segment stent with mild to moderate restenosis. This does not appear to be flow limiting.  Normal LV systolic function Normal LVEDP   Recommendations: Continue medical management of CAD. Consider addition of long acting nitrate if he has recurrent chest discomfort.   NST: 08/2023   The study is normal. There are no perfusion defects consistent with prior infarct or current ischemia. The study is low risk.   No ST deviation was noted.   Large mild  intensity inferior/inferoseptal defect most prominent in resting images with normal wall motion consistent with diaphragmatic attenuation.   Left ventricular function is normal. End diastolic cavity size is normal.  Risk Assessment/Calculations:   HYPERTENSION CONTROL Vitals:   10/06/23 1519 10/06/23 1549  BP: (!) 152/68 (!) 144/82    The patient's blood pressure is elevated above target today.  In order to address the patient's elevated  BP: Blood pressure will be monitored at home to determine if medication changes need to be made.; The blood pressure is usually elevated in clinic.  Blood pressures monitored at home have been optimal.    Physical Exam:   VS:  BP (!) 144/82   Pulse 84   Ht 5\' 10"  (1.778 m)   Wt 219 lb (99.3 kg)   SpO2 98%   BMI 31.42 kg/m    Wt Readings from Last 3 Encounters:  10/06/23 219 lb (99.3 kg)  09/07/23 216 lb (98 kg)  08/24/23 217 lb 9.6 oz (98.7 kg)     GEN: Well nourished, well developed male appearing in no acute distress NECK: No JVD; No carotid bruits CARDIAC: RRR, no murmurs, rubs, gallops RESPIRATORY:  Clear to auscultation without rales, wheezing or rhonchi  ABDOMEN: Appears non-distended. No obvious abdominal masses. EXTREMITIES: No clubbing or cyanosis. No pitting edema.  Distal pedal pulses are 2+ bilaterally.   Assessment and Plan:   1. CAD - He is s/p stenting to LCx and RCA in 2004 with cath in 05/2022 showing patent stents with severe apical LAD disease which was too small for PCI and moderate proximal RCA stenosis with medical management recommended. He did have a low-risk NST in 08/2023.  - We reviewed that his symptoms are likely due to stable angina and warning signs to monitor for which would be concerning for unstable angina. Continue current medical therapy with ASA 81 mg daily, Atorvastatin 20 mg daily and PRN SL NTG.  2. Palpitations - He does report occasional palpitations which last for few seconds to a minute and spontaneously resolve. By review of his EKG tracings during his recent stress test, he does have occasional PVC's.  At this time, he remains on Cardizem CD 180 mg daily. We reviewed the possibility of a Zio patch to assess for significant arrhythmias but he wishes to hold off on this for now. I encouraged him to make [place] aware if he has more frequent palpitations or dizziness as this can be arranged prior to his next visit.  3. HTN - BP was initially  recorded at 152/68, rechecked and improved to 144 82. I encouraged him to follow this at home. For now, continue current medical therapy with Cardizem CD 180 mg daily and Losartan 100 mg daily.  4. HLD - LDL was at 73 when checked in 05/2023. Remains on Atorvastatin 20 mg daily. If LDL remains above goal over time, would further titrate to 40 mg daily.  5. OSA - He reports being intolerant to his CPAP due to claustrophobia and is not interested in exploring alternatives for this at this time.  6. 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 Denies any specific claudication at this time. If worsening symptoms, could refer to Dr. Allyson Sabal or Dr. Kirke Corin. Remains on ASA 81 mg daily and Atorvastatin 20 mg daily.   Signed, Ellsworth Lennox, PA-C

## 2023-11-05 DIAGNOSIS — M109 Gout, unspecified: Secondary | ICD-10-CM | POA: Diagnosis not present

## 2023-11-05 DIAGNOSIS — R7301 Impaired fasting glucose: Secondary | ICD-10-CM | POA: Diagnosis not present

## 2023-11-05 DIAGNOSIS — E782 Mixed hyperlipidemia: Secondary | ICD-10-CM | POA: Diagnosis not present

## 2023-11-11 ENCOUNTER — Encounter: Payer: Self-pay | Admitting: Gastroenterology

## 2023-11-11 ENCOUNTER — Encounter: Payer: Self-pay | Admitting: *Deleted

## 2023-11-11 ENCOUNTER — Other Ambulatory Visit: Payer: Self-pay | Admitting: *Deleted

## 2023-11-11 ENCOUNTER — Ambulatory Visit: Payer: Medicare Other | Admitting: Gastroenterology

## 2023-11-11 VITALS — BP 136/77 | HR 69 | Temp 97.7°F | Ht 70.0 in | Wt 220.0 lb

## 2023-11-11 DIAGNOSIS — R195 Other fecal abnormalities: Secondary | ICD-10-CM

## 2023-11-11 DIAGNOSIS — Z860100 Personal history of colon polyps, unspecified: Secondary | ICD-10-CM

## 2023-11-11 DIAGNOSIS — Z860101 Personal history of adenomatous and serrated colon polyps: Secondary | ICD-10-CM | POA: Diagnosis not present

## 2023-11-11 DIAGNOSIS — Z860102 Personal history of hyperplastic colon polyps: Secondary | ICD-10-CM

## 2023-11-11 MED ORDER — PEG 3350-KCL-NA BICARB-NACL 420 G PO SOLR
4000.0000 mL | Freq: Once | ORAL | 0 refills | Status: AC
Start: 2023-11-11 — End: 2023-11-11

## 2023-11-11 NOTE — Progress Notes (Signed)
Referring Provider: Benita Stabile, MD Primary Care Physician:  Benita Stabile, MD Primary Gastroenterologist:  Dr. Jena Gauss  Chief Complaint  Patient presents with   Blood In Stools    Fecal immunochemical test positive     HPI:   Daniel Fields is a 67 y.o. male presenting today at the request of Benita Stabile, MD for occult blood in the stool.   Patient brought fit test result to the office with him today.  He had a positive result on 10/14/2023.  Today:  Doing well overall. No brbpr, melena, constipation, diarrhea, unintentional weight loss. No upper GI concerns such as GERD or dysphagia.    No labs included in referral.  Most recent labs 09/07/2023 with hemoglobin 15.8.  Last colonoscopy 07/29/2013: 1.25 cm pedunculated polyp in the right as well as multiple 4-10 mm polyps throughout the colon were removed.  Also with colonic diverticulosis.  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  Other pathology with multiple fragments of tubular adenomas, hyperplastic polyps, and single fragment of sessile serrated polyp.  Recommended repeat colonoscopy in 3 years.  Last EGD 07/29/2013: Abnormal gastric mucosa of uncertain significance biopsied.  Pathology with mild chronic inflammation.  No H. pylori.    Past Medical History:  Diagnosis Date   Arthritis    Blurred vision    CAD (coronary artery disease)    a. 2004 PCI: DES to LCX/RCA; b. 03/2008 Cath: patent stents, otw nl cors; c. Mult low risk MVs; d. 05/2022 Cath: LM nl, LAD 20m, LCX 40p/m, OM2 10, RCA 60p, 40p/m ISR, 30d, EF 65%-->Med Rx.   Chronic back pain    COPD (chronic obstructive pulmonary disease) (HCC)    Depression    GERD (gastroesophageal reflux disease)    Gout    Hypertension    IBS (irritable bowel syndrome)    Mixed hyperlipidemia    Sleep apnea    CPAP   Testicular cancer (HCC)    Status post resection and chemotherapy, age early 72s    Past Surgical History:  Procedure Laterality Date   CARDIAC  CATHETERIZATION  2009   CHOLECYSTECTOMY     COLONOSCOPY WITH ESOPHAGOGASTRODUODENOSCOPY (EGD) N/A 07/29/2013   Procedure: COLONOSCOPY WITH ESOPHAGOGASTRODUODENOSCOPY (EGD);  Surgeon: Corbin Ade, MD;  Location: [facility];  Service: Endoscopy;  Laterality: N/A;  12:45   CORONARY ANGIOPLASTY WITH STENT PLACEMENT  2004   3 total stents   FOOT SURGERY Right    LEFT HEART CATH AND CORONARY ANGIOGRAPHY N/A 05/07/2022   Procedure: LEFT HEART CATH AND CORONARY ANGIOGRAPHY;  Surgeon: Kathleene Hazel, MD;  Location: [facility] INVASIVE [facility];  Service: Cardiovascular;  Laterality: N/A;   SURGERY SCROTAL / TESTICULAR     right testicular cancer   THYROGLOSSAL DUCT CYST N/A 09/07/2023   Procedure: EXCISION OF THYROGLOSSAL DUCT CYST;  Surgeon: Serena Colonel, MD;  Location: [facility] OR;  Service: ENT;  Laterality: N/A;    Current Outpatient Medications  Medication Sig Dispense Refill   allopurinol (ZYLOPRIM) 100 MG tablet Take 100 mg by mouth daily.     aspirin EC 81 MG tablet Take 81 mg by mouth daily.     atorvastatin (LIPITOR) 20 MG tablet Take 20 mg by mouth at bedtime.     diltiazem (CARDIZEM CD) 180 MG 24 hr capsule Take 180 mg by mouth at bedtime.     folic acid (FOLVITE) 1 MG tablet Take 1 mg by mouth at bedtime.     HYDROcodone-acetaminophen (NORCO)  7.5-325 MG tablet Take 1 tablet by mouth 4 (four) times daily as needed for moderate pain.     losartan (COZAAR) 100 MG tablet Take 100 mg by mouth daily.     nitroGLYCERIN (NITROSTAT) 0.4 MG SL tablet Place 1 tablet (0.4 mg total) under the tongue every 5 (five) minutes as needed. 25 tablet 3   pantoprazole (PROTONIX) 40 MG tablet Take 40 mg by mouth at bedtime.     Potassium Chloride ER 20 MEQ TBCR Take 20 mEq by mouth daily.     No current facility-administered medications for this visit.    Allergies as of 11/11/2023   (No Known Allergies)    Family History  Problem Relation Age of Onset   Breast cancer Cousin        2   Kidney failure  Brother    Heart disease Father    Hypertension Father    Stroke Father    Diabetes Mother    CAD Mother    Fibromyalgia Mother    Diabetes Brother    Colon cancer Neg Hx    Crohn's disease Neg Hx    Ulcerative colitis Neg Hx     Social History   Socioeconomic History   Marital status: Divorced    Spouse name: Not on file   Number of children: Not on file   Years of education: Not on file   Highest education level: Not on file  Occupational History   Not on file  Tobacco Use   Smoking status: Every Day    Current packs/day: 1.00    Average packs/day: 1 pack/day for 40.0 years (40.0 ttl pk-yrs)    Types: Cigarettes   Smokeless tobacco: Never   Tobacco comments:    Cutting back - goal is to quit   Vaping Use   Vaping status: Former  Substance and Sexual Activity   Alcohol use: No    Alcohol/week: 0.0 standard drinks of alcohol   Drug use: No   Sexual activity: Not on file  Other Topics Concern   Not on file  Social History Narrative   Not on file   Social Determinants of Health   Financial Resource Strain: Not on file  Food Insecurity: Low Risk  (06/26/2023)   Received from [facility]   Hunger Vital Sign    Worried About Running Out of Food in the Last Year: Never true    Ran Out of Food in the Last Year: Never true  Transportation Needs: Not on file (06/26/2023)  Physical Activity: Not on file  Stress: Not on file  Social Connections: Not on file  Intimate Partner Violence: Not on file    Review of Systems: Gen: Denies any fever, chills, cold or flulike symptoms presyncope, syncope. CV: Denies chest pain, heart palpitations. Resp: Denies shortness of breath at rest, cough.  GI: See HPI GU : Denies urinary burning, urinary frequency, urinary hesitancy MS: Denies joint pain. Derm: Denies rash. Psych: Denies depression, anxiety. Heme: See HPI  Physical Exam: BP 136/77 (BP Location: Right Arm, Patient Position: Sitting, Cuff Size: Large)   Pulse 69    Temp 97.7 F (36.5 C) (Temporal)   Ht 5\' 10"  (1.778 m)   Wt 220 lb (99.8 kg)   BMI 31.57 kg/m  General:   Alert and oriented. Pleasant and cooperative. Well-nourished and well-developed.  Head:  Normocephalic and atraumatic. Eyes:  Without icterus, sclera clear and conjunctiva pink.  Ears:  Normal auditory acuity. Lungs:  Clear to auscultation  bilaterally. No wheezes, rales, or rhonchi. No distress.  Heart:  S1, S2 present without murmurs appreciated.  Abdomen:  +BS, soft, non-tender and non-distended. No HSM noted. No guarding or rebound. No masses appreciated.  Rectal:  Deferred  Msk:  Symmetrical without gross deformities. Normal posture. Extremities:  Without edema. Neurologic:  Alert and  oriented x4;  grossly normal neurologically. Skin:  Intact without significant lesions or rashes. Psych:   Normal mood and affect.    Assessment:  67 y.o. male with history of CAD s/p PCI with stent placement, COPD, HTN, HLD, testicular cancer, depression, GERD, adenomatous colon polyps, presenting today at the request of Dr. Margo Aye for further evaluation of positive fit test completed 10/14/2023.  Patient denies any significant upper or lower GI symptoms.  No alarm symptoms.  He is well overdue for surveillance colonoscopy.  His last colonoscopy was in 2014 with a large tubulovillous adenoma removed from his rectum as well as multiple tubular adenomas, hyperplastic polyps, and a sessile serrated polyp removed at that time.   We will get him scheduled for a colonoscopy in the near future to further evaluate positive FIT and follow-up on his history of colon polyps.    Plan:  Proceed with colonoscopy with propofol by Dr. Jena Gauss in near future. The risks, benefits, and alternatives have been discussed with the patient in detail. The patient states understanding and desires to proceed.  ASA 3 Follow-up per Dr. Jena Gauss.    Ermalinda Memos, PA-C [facility] 11/11/2023

## 2023-11-11 NOTE — Patient Instructions (Signed)
We will get you scheduled for a colonoscopy in the near future with Dr. Jena Gauss.  We will follow-up with you as Dr. Jena Gauss recommends.  It was very nice to meet you today!  Ermalinda Memos, PA-C [facility]

## 2023-11-12 ENCOUNTER — Encounter: Payer: Self-pay | Admitting: Internal Medicine

## 2023-11-12 ENCOUNTER — Ambulatory Visit (HOSPITAL_COMMUNITY)
Admission: RE | Admit: 2023-11-12 | Discharge: 2023-11-12 | Disposition: A | Discharge status: Home / Self Care | Payer: Medicare Other | Source: Ambulatory Visit | Attending: Internal Medicine | Admitting: Internal Medicine

## 2023-11-12 DIAGNOSIS — Z23 Encounter for immunization: Secondary | ICD-10-CM | POA: Diagnosis not present

## 2023-11-12 DIAGNOSIS — F17210 Nicotine dependence, cigarettes, uncomplicated: Secondary | ICD-10-CM | POA: Insufficient documentation

## 2023-11-12 DIAGNOSIS — Z87891 Personal history of nicotine dependence: Secondary | ICD-10-CM | POA: Insufficient documentation

## 2023-11-12 DIAGNOSIS — K219 Gastro-esophageal reflux disease without esophagitis: Secondary | ICD-10-CM | POA: Diagnosis not present

## 2023-11-12 DIAGNOSIS — Z122 Encounter for screening for malignant neoplasm of respiratory organs: Secondary | ICD-10-CM | POA: Diagnosis not present

## 2023-11-12 DIAGNOSIS — I10 Essential (primary) hypertension: Secondary | ICD-10-CM | POA: Diagnosis not present

## 2023-11-12 DIAGNOSIS — E782 Mixed hyperlipidemia: Secondary | ICD-10-CM | POA: Diagnosis not present

## 2023-11-12 DIAGNOSIS — I2510 Atherosclerotic heart disease of native coronary artery without angina pectoris: Secondary | ICD-10-CM | POA: Diagnosis not present

## 2023-11-26 ENCOUNTER — Telehealth: Payer: Self-pay | Admitting: Acute Care

## 2023-11-26 DIAGNOSIS — R911 Solitary pulmonary nodule: Secondary | ICD-10-CM

## 2023-11-26 NOTE — Telephone Encounter (Signed)
Call Report  for CT Chest Lung Cancer Screening 11/20/23

## 2023-11-26 NOTE — Telephone Encounter (Signed)
CT results reviewed by Dr Tonia Brooms. Plan is for a 3 month nodule f/u CT.

## 2023-11-30 NOTE — Telephone Encounter (Signed)
Called and spoke with pt. Informed him of the results of LDCT. We discussed the suspicious nodule and recommendations to have a 3 month follow up scan. Patient verbalized understanding and is agreeable to follow up scan. Order placed. Results and plan sent to PCP. Pt verbalized understanding and denied any further questions or concerns at this time.

## 2023-12-08 DIAGNOSIS — K080 Exfoliation of teeth due to systemic causes: Secondary | ICD-10-CM | POA: Diagnosis not present

## 2023-12-09 DIAGNOSIS — K080 Exfoliation of teeth due to systemic causes: Secondary | ICD-10-CM | POA: Diagnosis not present

## 2023-12-16 ENCOUNTER — Encounter (HOSPITAL_COMMUNITY): Payer: Self-pay

## 2023-12-16 ENCOUNTER — Encounter (HOSPITAL_COMMUNITY)
Admission: RE | Admit: 2023-12-16 | Discharge: 2023-12-16 | Disposition: A | Discharge status: Home / Self Care | Payer: Medicare Other | Source: Ambulatory Visit | Attending: Internal Medicine | Admitting: Internal Medicine

## 2023-12-23 ENCOUNTER — Ambulatory Visit (HOSPITAL_COMMUNITY)
Admission: RE | Admit: 2023-12-23 | Discharge: 2023-12-23 | Disposition: A | Discharge status: Home / Self Care | Payer: Medicare Other | Attending: Internal Medicine | Admitting: Internal Medicine

## 2023-12-23 ENCOUNTER — Encounter (HOSPITAL_COMMUNITY): Payer: Self-pay | Admitting: Internal Medicine

## 2023-12-23 ENCOUNTER — Ambulatory Visit (HOSPITAL_COMMUNITY): Payer: Medicare Other | Admitting: Anesthesiology

## 2023-12-23 ENCOUNTER — Ambulatory Visit (HOSPITAL_BASED_OUTPATIENT_CLINIC_OR_DEPARTMENT_OTHER): Payer: Medicare Other | Admitting: Anesthesiology

## 2023-12-23 ENCOUNTER — Encounter (HOSPITAL_COMMUNITY)
Admission: RE | Disposition: A | Discharge status: Home / Self Care | Payer: Self-pay | Source: Home / Self Care | Attending: Internal Medicine

## 2023-12-23 DIAGNOSIS — I10 Essential (primary) hypertension: Secondary | ICD-10-CM | POA: Diagnosis not present

## 2023-12-23 DIAGNOSIS — F32A Depression, unspecified: Secondary | ICD-10-CM | POA: Insufficient documentation

## 2023-12-23 DIAGNOSIS — K5730 Diverticulosis of large intestine without perforation or abscess without bleeding: Secondary | ICD-10-CM | POA: Insufficient documentation

## 2023-12-23 DIAGNOSIS — Z79899 Other long term (current) drug therapy: Secondary | ICD-10-CM | POA: Diagnosis not present

## 2023-12-23 DIAGNOSIS — K635 Polyp of colon: Secondary | ICD-10-CM | POA: Diagnosis not present

## 2023-12-23 DIAGNOSIS — D128 Benign neoplasm of rectum: Secondary | ICD-10-CM | POA: Insufficient documentation

## 2023-12-23 DIAGNOSIS — K219 Gastro-esophageal reflux disease without esophagitis: Secondary | ICD-10-CM | POA: Insufficient documentation

## 2023-12-23 DIAGNOSIS — D127 Benign neoplasm of rectosigmoid junction: Secondary | ICD-10-CM | POA: Diagnosis not present

## 2023-12-23 DIAGNOSIS — I25119 Atherosclerotic heart disease of native coronary artery with unspecified angina pectoris: Secondary | ICD-10-CM | POA: Insufficient documentation

## 2023-12-23 DIAGNOSIS — Z85038 Personal history of other malignant neoplasm of large intestine: Secondary | ICD-10-CM | POA: Diagnosis not present

## 2023-12-23 DIAGNOSIS — Z7982 Long term (current) use of aspirin: Secondary | ICD-10-CM | POA: Diagnosis not present

## 2023-12-23 DIAGNOSIS — D125 Benign neoplasm of sigmoid colon: Secondary | ICD-10-CM

## 2023-12-23 DIAGNOSIS — R195 Other fecal abnormalities: Secondary | ICD-10-CM | POA: Insufficient documentation

## 2023-12-23 DIAGNOSIS — F17210 Nicotine dependence, cigarettes, uncomplicated: Secondary | ICD-10-CM | POA: Insufficient documentation

## 2023-12-23 DIAGNOSIS — G4730 Sleep apnea, unspecified: Secondary | ICD-10-CM | POA: Diagnosis not present

## 2023-12-23 DIAGNOSIS — D123 Benign neoplasm of transverse colon: Secondary | ICD-10-CM | POA: Insufficient documentation

## 2023-12-23 DIAGNOSIS — Z1211 Encounter for screening for malignant neoplasm of colon: Secondary | ICD-10-CM | POA: Diagnosis not present

## 2023-12-23 DIAGNOSIS — J449 Chronic obstructive pulmonary disease, unspecified: Secondary | ICD-10-CM | POA: Diagnosis not present

## 2023-12-23 DIAGNOSIS — Z9889 Other specified postprocedural states: Secondary | ICD-10-CM

## 2023-12-23 DIAGNOSIS — I2510 Atherosclerotic heart disease of native coronary artery without angina pectoris: Secondary | ICD-10-CM

## 2023-12-23 DIAGNOSIS — Z860101 Personal history of adenomatous and serrated colon polyps: Secondary | ICD-10-CM

## 2023-12-23 HISTORY — PX: COLONOSCOPY WITH PROPOFOL: SHX5780

## 2023-12-23 HISTORY — PX: POLYPECTOMY: SHX5525

## 2023-12-23 SURGERY — COLONOSCOPY WITH PROPOFOL
Anesthesia: General

## 2023-12-23 MED ORDER — PROPOFOL 500 MG/50ML IV EMUL
INTRAVENOUS | Status: DC | PRN
  Administered 2023-12-23: 150 ug/kg/min via INTRAVENOUS

## 2023-12-23 MED ORDER — LIDOCAINE HCL (CARDIAC) PF 100 MG/5ML IV SOSY
PREFILLED_SYRINGE | INTRAVENOUS | Status: DC | PRN
  Administered 2023-12-23: 50 mg via INTRAVENOUS

## 2023-12-23 MED ORDER — PHENYLEPHRINE 80 MCG/ML (10ML) SYRINGE FOR IV PUSH (FOR BLOOD PRESSURE SUPPORT)
PREFILLED_SYRINGE | INTRAVENOUS | Status: DC | PRN
  Administered 2023-12-23 (×3): 80 ug via INTRAVENOUS
  Administered 2023-12-23 (×2): 160 ug via INTRAVENOUS

## 2023-12-23 MED ORDER — PROPOFOL 10 MG/ML IV BOLUS
INTRAVENOUS | Status: DC | PRN
  Administered 2023-12-23: 40 mg via INTRAVENOUS
  Administered 2023-12-23: 100 mg via INTRAVENOUS

## 2023-12-23 MED ORDER — PHENYLEPHRINE 80 MCG/ML (10ML) SYRINGE FOR IV PUSH (FOR BLOOD PRESSURE SUPPORT)
PREFILLED_SYRINGE | INTRAVENOUS | Status: AC
  Filled 2023-12-23: qty 10

## 2023-12-23 MED ORDER — LACTATED RINGERS IV SOLN: INTRAVENOUS | Status: DC

## 2023-12-23 NOTE — Anesthesia Procedure Notes (Signed)
Date/Time: 12/23/2023 10:20 AM  Performed by: Julian Reil, CRNAPre-anesthesia Checklist: Patient identified, Emergency Drugs available, Suction available and Patient being monitored Patient Re-evaluated:Patient Re-evaluated prior to induction Oxygen Delivery Method: Nasal cannula Induction Type: IV induction Placement Confirmation: positive ETCO2 Comments: Optiflow High Flow [place] O2 used.

## 2023-12-23 NOTE — Op Note (Addendum)
[facility] Patient Name: Daniel Fields Procedure Date: 12/23/2023 9:59 AM MRN: [id number] Date of Birth: [birth year] Attending MD: Gennette Pac , MD, [id number] CSN: [id number] Age: 68 Admit Type: Outpatient Procedure:                Colonoscopy Indications:              Positive fecal immunochemical test; far overdue for                            surveillance colonoscopy. History of advanced                            adenoma removed 2014 Providers:                Gennette Pac, MD, Nena Polio, RN, Elinor Parkinson Referring MD:              Medicines:                Propofol per Anesthesia Complications:            No immediate complications. Estimated Blood Loss:     Estimated blood loss was minimal. Procedure:                Pre-Anesthesia Assessment:                           - Prior to the procedure, a History and Physical                            was performed, and patient medications and                            allergies were reviewed. The patient's tolerance of                            previous anesthesia was also reviewed. The risks                            and benefits of the procedure and the sedation                            options and risks were discussed with the patient.                            All questions were answered, and informed consent                            was obtained. Prior Anticoagulants: The patient has                            taken no anticoagulant or antiplatelet agents. ASA  Grade Assessment: III - A patient with severe                            systemic disease. After reviewing the risks and                            benefits, the patient was deemed in satisfactory                            condition to undergo the procedure.                           After obtaining informed consent, the colonoscope                            was passed under direct  vision. Throughout the                            procedure, the patient's blood pressure, pulse, and                            oxygen saturations were monitored continuously. The                            [id number]) scope was introduced through the                            anus and advanced to the the cecum, identified by                            appendiceal orifice and ileocecal valve. The                            colonoscopy was performed without difficulty. The                            patient tolerated the procedure well. The quality                            of the bowel preparation was adequate. The                            colonoscopy was performed without difficulty. The                            patient tolerated the procedure well. The quality                            of the bowel preparation was adequate. Scope In: 10:26:44 AM Scope Out: 11:15:24 AM Scope Withdrawal Time: 0 hours 27 minutes 18 seconds  Total Procedure Duration: 0 hours 48 minutes 40 seconds  Findings:      The perianal and digital rectal examinations were normal.      Scattered medium-mouthed diverticula were found in the sigmoid  colon,       descending colon and transverse colon.      Ten semi-pedunculated polyps were found in the hepatic flexure. The       polyps were 5 to 8 mm in size.      Five semi-pedunculated polyps were found in the splenic flexure. The       polyps were 4 to 8 mm in size.      Three semi-pedunculated polyps were found in the rectum and proximal       rectum. The polyps were 3 to 5 mm in size.      Seven semi-pedunculated polyps were found in the recto-sigmoid colon.       The polyps were 7 to 10 mm in size. These polyps were removed with a       cold snare. Resection and retrieval were complete. Estimated blood loss       was minimal.      Single diminutive polyp in the proximal rectum ablated with the tip of       the hot snare.      One 1 polypectomy site  in the ascending segment was touched up with the       hot snare tip to deal with oozing. This was on the way and on the way       out he continued to use. 1 resolution clip placed with good hemostasis. Impression:               - Diverticulosis in the sigmoid colon, in the                            descending colon and in the transverse colon.                           - Ten 5 to 8 mm polyps at the hepatic flexure.                           - Five 4 to 8 mm polyps at the splenic flexure.                           - Three 3 to 5 mm polyps in the rectum and in the                            proximal rectum.                           - Seven 7 to 10 mm polyps at the recto-sigmoid                            colon, removed with a cold snare. Resected and                            retrieved. 1 ablation. Clip x 1 as described above.                            Rectal polyp ablatedx1 Moderate Sedation:      Moderate (conscious) sedation was personally administered by an  anesthesia professional. The following parameters were monitored: oxygen       saturation, heart rate, blood pressure, respiratory rate, EKG, adequacy       of pulmonary ventilation, and response to care. Recommendation:           - Patient has a contact number available for                            emergencies. The signs and symptoms of potential                            delayed complications were discussed with the                            patient. Return to normal activities tomorrow.                            Written discharge instructions were provided to the                            patient.                           - Advance diet as tolerated.                           - Continue present medications. Follow-up on                            pathology. Further recommendations to follow. Procedure Code(s):        --- Professional ---                           [phone], Colonoscopy, flexible; with removal of                             tumor(s), polyp(s), or other lesion(s) by snare                            technique Diagnosis Code(s):        --- Professional ---                           D12.3, Benign neoplasm of transverse colon (hepatic                            flexure or splenic flexure)                           D12.8, Benign neoplasm of rectum                           D12.7, Benign neoplasm of rectosigmoid junction                           R19.5, Other fecal abnormalities  K57.30, Diverticulosis of large intestine without                            perforation or abscess without bleeding CPT copyright 2022 American [employer] Association. All rights reserved. The codes documented in this report are preliminary and upon coder review may  be revised to meet current compliance requirements. Gerrit Friends. Tekela Garguilo, MD Gennette Pac, MD 12/23/2023 11:28:37 AM This report has been signed electronically. Number of Addenda: 0

## 2023-12-23 NOTE — Discharge Instructions (Signed)
  Colonoscopy Discharge Instructions  Read the instructions outlined below and refer to this sheet in the next few weeks. These discharge instructions provide you with general information on caring for yourself after you leave the hospital. Your doctor may also give you specific instructions. While your treatment has been planned according to the most current medical practices available, unavoidable complications occasionally occur. If you have any problems or questions after discharge, call Dr. Jena Gauss at [phone]. ACTIVITY You may resume your regular activity, but move at a slower pace for the next 24 hours.  Take frequent rest periods for the next 24 hours.  Walking will help get rid of the air and reduce the bloated feeling in your belly (abdomen).  No driving for 24 hours (because of the medicine (anesthesia) used during the test).   Do not sign any important legal documents or operate any machinery for 24 hours (because of the anesthesia used during the test).  NUTRITION Drink plenty of fluids.  You may resume your normal diet as instructed by your doctor.  Begin with a light meal and progress to your normal diet. Heavy or fried foods are harder to digest and may make you feel sick to your stomach (nauseated).  Avoid alcoholic beverages for 24 hours or as instructed.  MEDICATIONS You may resume your normal medications unless your doctor tells you otherwise.  WHAT YOU CAN EXPECT TODAY Some feelings of bloating in the abdomen.  Passage of more gas than usual.  Spotting of blood in your stool or on the toilet paper.  IF YOU HAD POLYPS REMOVED DURING THE COLONOSCOPY: No aspirin products for 7 days or as instructed.  No alcohol for 7 days or as instructed.  Eat a soft diet for the next 24 hours.  FINDING OUT THE RESULTS OF YOUR TEST Not all test results are available during your visit. If your test results are not back during the visit, make an appointment with your caregiver to find out the  results. Do not assume everything is normal if you have not heard from your caregiver or the medical facility. It is important for you to follow up on all of your test results.  SEEK IMMEDIATE MEDICAL ATTENTION IF: You have more than a spotting of blood in your stool.  Your belly is swollen (abdominal distention).  You are nauseated or vomiting.  You have a temperature over 101.  You have abdominal pain or discomfort that is severe or gets worse throughout the day.      25 polyps removed from your colon today  Information on colon polyp and diverticulosis  At a minimum, you will need a repeat colonoscopy in 1 year  Further recommendations to follow pending review of pathology report   at patient request, I called Dorene Sorrow children at [phone] -  reviewed findings and recommendations

## 2023-12-23 NOTE — Transfer of Care (Signed)
Immediate Anesthesia Transfer of Care Note  Patient: Daniel Fields  Procedure(s) Performed: COLONOSCOPY WITH PROPOFOL  Patient Location: Short Stay  Anesthesia Type:General  Level of Consciousness: awake  Airway & Oxygen Therapy: Patient Spontanous Breathing  Post-op Assessment: Report given to RN and Post -op Vital signs reviewed and stable  Post vital signs: Reviewed and stable  Last Vitals:  Vitals Value Taken Time  BP    Temp    Pulse    Resp    SpO2      Last Pain:  Vitals:   12/23/23 0845  PainSc: 0-No pain         Complications: No notable events documented.

## 2023-12-23 NOTE — H&P (Signed)
@LOGO @   Primary Care Physician:  Benita Stabile, MD Primary Gastroenterologist:  Dr. Jena Gauss  Pre-Procedure History & Physical: HPI:  Daniel Fields is a 68 y.o. male here for   For surveillance colonoscopy.  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  He is far overdue for surveillance examination.  Recently FIT+  Past Medical History:  Diagnosis Date   Arthritis    Blurred vision    CAD (coronary artery disease)    a. 2004 PCI: DES to LCX/RCA; b. 03/2008 Cath: patent stents, otw nl cors; c. Mult low risk MVs; d. 05/2022 Cath: LM nl, LAD 60m, LCX 40p/m, OM2 10, RCA 60p, 40p/m ISR, 30d, EF 65%-->Med Rx.   Chronic back pain    COPD (chronic obstructive pulmonary disease) (HCC)    Depression    GERD (gastroesophageal reflux disease)    Gout    Hypertension    IBS (irritable bowel syndrome)    Mixed hyperlipidemia    Sleep apnea    CPAP   Testicular cancer (HCC)    Status post resection and chemotherapy, age early 56s    Past Surgical History:  Procedure Laterality Date   CARDIAC CATHETERIZATION  2009   CHOLECYSTECTOMY     COLONOSCOPY WITH ESOPHAGOGASTRODUODENOSCOPY (EGD) N/A 07/29/2013   Procedure: COLONOSCOPY WITH ESOPHAGOGASTRODUODENOSCOPY (EGD);  Surgeon: Corbin Ade, MD;  Location: [facility];  Service: Endoscopy;  Laterality: N/A;  12:45   CORONARY ANGIOPLASTY WITH STENT PLACEMENT  2004   3 total stents   FOOT SURGERY Right    LEFT HEART CATH AND CORONARY ANGIOGRAPHY N/A 05/07/2022   Procedure: LEFT HEART CATH AND CORONARY ANGIOGRAPHY;  Surgeon: Kathleene Hazel, MD;  Location: [facility];  Service: Cardiovascular;  Laterality: N/A;   SURGERY SCROTAL / TESTICULAR     right testicular cancer   THYROGLOSSAL DUCT CYST N/A 09/07/2023   Procedure: EXCISION OF THYROGLOSSAL DUCT CYST;  Surgeon: Serena Colonel, MD;  Location: [facility] OR;  Service: ENT;  Laterality: N/A;    Prior to Admission medications    Medication Sig Start Date End Date Taking? Authorizing Provider  allopurinol (ZYLOPRIM) 100 MG tablet Take 100 mg by mouth daily. 04/30/22   [provider]  aspirin EC 81 MG tablet Take 81 mg by mouth daily.    [provider]  atorvastatin (LIPITOR) 20 MG tablet Take 20 mg by mouth at bedtime. 01/05/14   [provider]  diltiazem (CARDIZEM CD) 180 MG 24 hr capsule Take 180 mg by mouth at bedtime.    [provider]  folic acid (FOLVITE) 1 MG tablet Take 1 mg by mouth at bedtime.    [provider]  HYDROcodone-acetaminophen (NORCO) 7.5-325 MG tablet Take 1 tablet by mouth 4 (four) times daily as needed for moderate pain. 05/05/22   [provider]  losartan (COZAAR) 100 MG tablet Take 100 mg by mouth daily. 06/03/23   [provider]  nitroGLYCERIN (NITROSTAT) 0.4 MG SL tablet Place 1 tablet (0.4 mg total) under the tongue every 5 (five) minutes as needed. 05/08/22   Bhagat, Sharrell Ku, PA  pantoprazole (PROTONIX) 40 MG tablet Take 40 mg by mouth at bedtime.    [provider]  Potassium Chloride ER 20 MEQ TBCR Take 20 mEq by mouth daily. 04/29/22   [provider]    Allergies as of 11/11/2023   (No Known Allergies)    Family History  Problem Relation Age of Onset  Breast cancer Cousin        2   Kidney failure Brother    Heart disease Father    Hypertension Father    Stroke Father    Diabetes Mother    CAD Mother    Fibromyalgia Mother    Diabetes Brother    Colon cancer Neg Hx    Crohn's disease Neg Hx    Ulcerative colitis Neg Hx     Social History   Socioeconomic History   Marital status: Divorced    Spouse name: Not on file   Number of children: Not on file   Years of education: Not on file   Highest education level: Not on file  Occupational History   Not on file  Tobacco Use   Smoking status: Every Day    Current packs/day: 1.00    Average packs/day: 1 pack/day for 40.0 years (40.0 ttl  pk-yrs)    Types: Cigarettes   Smokeless tobacco: Never   Tobacco comments:    Cutting back - goal is to quit   Vaping Use   Vaping status: Former  Substance and Sexual Activity   Alcohol use: No    Alcohol/week: 0.0 standard drinks of alcohol   Drug use: No   Sexual activity: Not on file  Other Topics Concern   Not on file  Social History Narrative   Not on file   Social Drivers of Health   Financial Resource Strain: Not on file  Food Insecurity: Low Risk  (06/26/2023)   Received from [facility]   Hunger Vital Sign    Worried About Running Out of Food in the Last Year: Never true    Ran Out of Food in the Last Year: Never true  Transportation Needs: Not on file (06/26/2023)  Physical Activity: Not on file  Stress: Not on file  Social Connections: Not on file  Intimate Partner Violence: Not on file    Review of Systems: See HPI, otherwise negative ROS  Physical Exam: BP (!) 144/78 (BP Location: Right Arm)   Pulse 77   Temp 98.6 F (37 C)   Resp 15   SpO2 100%  General:   Alert,  Well-developed, well-nourished, pleasant and cooperative in NAD Neck:  Supple; no masses or thyromegaly. No significant cervical adenopathy. Lungs:  Clear throughout to auscultation.   No wheezes, crackles, or rhonchi. No acute distress. Heart:  Regular rate and rhythm; no murmurs, clicks, rubs,  or gallops. Abdomen: Non-distended, normal bowel sounds.  Soft and nontender without appreciable mass or hepatosplenomegaly.  Pulses:  Normal pulses noted. Extremities:  Without clubbing or edema.  Impression/Plan:   68 year old gentleman with occult blood in stool history of high-grade adenomas removed 10 years ago.   far overdue for follow-up colonoscopy. The risks, benefits, limitations, alternatives and imponderables have been reviewed with the patient. Questions have been answered. All parties are agreeable.       Notice: This dictation was prepared with Dragon dictation along with  smaller phrase technology. Any transcriptional errors that result from this process are unintentional and may not be corrected upon review.

## 2023-12-23 NOTE — Anesthesia Preprocedure Evaluation (Signed)
Anesthesia Evaluation  Patient identified by MRN, date of birth, ID band Patient awake    Reviewed: Allergy & Precautions, H&P , NPO status , Patient's Chart, lab work & pertinent test results, reviewed documented beta blocker date and time   Airway Mallampati: II  TM Distance: >3 FB Neck ROM: full    Dental no notable dental hx.    Pulmonary neg pulmonary ROS, sleep apnea , COPD, Current Smoker and Patient abstained from smoking.   Pulmonary exam normal breath sounds clear to auscultation       Cardiovascular Exercise Tolerance: Good hypertension, + angina  + CAD  negative cardio ROS  Rhythm:regular Rate:Normal     Neuro/Psych  PSYCHIATRIC DISORDERS  Depression    negative neurological ROS  negative psych ROS   GI/Hepatic negative GI ROS, Neg liver ROS,GERD  ,,  Endo/Other  negative endocrine ROS    Renal/GU negative Renal ROS  negative genitourinary   Musculoskeletal   Abdominal   Peds  Hematology negative hematology ROS (+)   Anesthesia Other Findings   Reproductive/Obstetrics negative OB ROS                             Anesthesia Physical Anesthesia Plan  ASA: 3  Anesthesia Plan: General   Post-op Pain Management:    Induction:   PONV Risk Score and Plan: Propofol infusion  Airway Management Planned:   Additional Equipment:   Intra-op Plan:   Post-operative Plan:   Informed Consent: I have reviewed the patients History and Physical, chart, labs and discussed the procedure including the risks, benefits and alternatives for the proposed anesthesia with the patient or authorized representative who has indicated his/her understanding and acceptance.     Dental Advisory Given  Plan Discussed with: CRNA  Anesthesia Plan Comments:        Anesthesia Quick Evaluation

## 2023-12-24 ENCOUNTER — Encounter (HOSPITAL_COMMUNITY): Payer: Self-pay | Admitting: Internal Medicine

## 2023-12-24 ENCOUNTER — Encounter: Payer: Self-pay | Admitting: Internal Medicine

## 2023-12-24 LAB — SURGICAL PATHOLOGY

## 2023-12-28 NOTE — Anesthesia Postprocedure Evaluation (Signed)
Anesthesia Post Note  Patient: Daniel Fields  Procedure(s) Performed: COLONOSCOPY WITH PROPOFOL POLYPECTOMY  Patient location during evaluation: Phase II Anesthesia Type: General Level of consciousness: awake Pain management: pain level controlled Vital Signs Assessment: post-procedure vital signs reviewed and stable Respiratory status: spontaneous breathing and respiratory function stable Cardiovascular status: blood pressure returned to baseline and stable Postop Assessment: no headache and no apparent nausea or vomiting Anesthetic complications: no Comments: Late entry   No notable events documented.   Last Vitals:  Vitals:   12/23/23 0845 12/23/23 1117  BP: (!) 144/78 (!) 123/57  Pulse: 77 66  Resp: 15 16  Temp: 37 C 36.4 C  SpO2: 100% 100%    Last Pain:  Vitals:   12/24/23 1139  TempSrc:   PainSc: 0-No pain                 Windell Norfolk

## 2024-02-10 ENCOUNTER — Ambulatory Visit (HOSPITAL_COMMUNITY): Admission: RE | Admit: 2024-02-10 | Payer: Medicare Other | Source: Ambulatory Visit

## 2024-04-11 ENCOUNTER — Ambulatory Visit (HOSPITAL_COMMUNITY)
Admission: RE | Admit: 2024-04-11 | Discharge: 2024-04-11 | Disposition: A | Discharge status: Home / Self Care | Source: Ambulatory Visit | Attending: Acute Care | Admitting: Acute Care

## 2024-04-11 DIAGNOSIS — R911 Solitary pulmonary nodule: Secondary | ICD-10-CM | POA: Diagnosis not present

## 2024-04-11 DIAGNOSIS — I700 Atherosclerosis of aorta: Secondary | ICD-10-CM | POA: Diagnosis not present

## 2024-04-11 DIAGNOSIS — J439 Emphysema, unspecified: Secondary | ICD-10-CM | POA: Diagnosis not present

## 2024-05-05 ENCOUNTER — Telehealth: Payer: Self-pay | Admitting: Acute Care

## 2024-05-05 NOTE — Telephone Encounter (Signed)
 Call report received:    IMPRESSION: 1. Lung-RADS 4B, suspicious. Additional imaging evaluation or consultation with Pulmonology or Thoracic Surgery recommended. 2. Coronary artery calcifications. 3. Aortic Atherosclerosis (ICD10-I70.0) and Emphysema (ICD10-J43.9).   These results will be called to the ordering clinician or representative by the Radiologist Assistant, and communication documented in the PACS or [employer].     Electronically Signed   By: Kimberley Penman M.D.   On: 05/05/2024 11:51

## 2024-05-06 ENCOUNTER — Encounter: Payer: Self-pay | Admitting: Cardiology

## 2024-05-06 ENCOUNTER — Ambulatory Visit: Attending: Cardiology | Admitting: Cardiology

## 2024-05-06 ENCOUNTER — Other Ambulatory Visit: Payer: Self-pay | Admitting: Acute Care

## 2024-05-06 VITALS — BP 138/76 | HR 70 | Ht 70.0 in | Wt 225.0 lb

## 2024-05-06 DIAGNOSIS — I25119 Atherosclerotic heart disease of native coronary artery with unspecified angina pectoris: Secondary | ICD-10-CM | POA: Diagnosis not present

## 2024-05-06 DIAGNOSIS — I10 Essential (primary) hypertension: Secondary | ICD-10-CM | POA: Diagnosis not present

## 2024-05-06 DIAGNOSIS — E782 Mixed hyperlipidemia: Secondary | ICD-10-CM

## 2024-05-06 DIAGNOSIS — M109 Gout, unspecified: Secondary | ICD-10-CM | POA: Diagnosis not present

## 2024-05-06 DIAGNOSIS — Z720 Tobacco use: Secondary | ICD-10-CM

## 2024-05-06 DIAGNOSIS — Z125 Encounter for screening for malignant neoplasm of prostate: Secondary | ICD-10-CM | POA: Diagnosis not present

## 2024-05-06 DIAGNOSIS — R911 Solitary pulmonary nodule: Secondary | ICD-10-CM

## 2024-05-06 DIAGNOSIS — R7301 Impaired fasting glucose: Secondary | ICD-10-CM | POA: Diagnosis not present

## 2024-05-06 NOTE — Progress Notes (Signed)
    Cardiology Office Note  Date: 05/06/2024   ID: Damonta, Cossey 22-Dec-1955, MRN [id number]  History of Present Illness: Daniel Fields is a 68 y.o. male last seen in November 2024 by Steva Ek, I reviewed her note.  Our last visit was in 2023.  He is here for a routine visit.  From a cardiac perspective, he does not report any angina or nitroglycerin  use.  He underwent a follow-up Lexiscan  Myoview  in September of last year which was low risk, no frank ischemia noted.  He just had a recent screening chest CT per Pulmonary demonstrating a suspicious lung nodule with plan for pet imaging.  He has a longstanding tobacco use history.  We went over his medications.  He reports compliance with his cardiac regimen, no intolerances.  I reviewed his lab work which is noted below.  Physical Exam: VS:  BP 138/76   Pulse 70   Ht 5\' 10"  (1.778 m)   Wt 225 lb (102.1 kg)   SpO2 98%   BMI 32.28 kg/m , BMI Body mass index is 32.28 kg/m.  Wt Readings from Last 3 Encounters:  05/06/24 225 lb (102.1 kg)  12/16/23 220 lb (99.8 kg)  11/11/23 220 lb (99.8 kg)    General: Patient appears comfortable at rest. HEENT: Conjunctiva and lids normal. Neck: Supple, no elevated JVP or carotid bruits. Lungs: Clear to auscultation, nonlabored breathing at rest. Cardiac: Regular rate and rhythm, no S3 or significant systolic murmur. Extremities: No pitting edema.  ECG:  An ECG dated 08/24/2023 was personally reviewed today and demonstrated:  Sinus rhythm with limb lead reversal.  Labwork: 09/07/2023: BUN 17; Creatinine, Ser 1.09; Hemoglobin 15.8; Platelets 138; Potassium 3.9; Sodium 137  December 2024: Hemoglobin 16.5, platelets 147, BUN 14, creatinine 1.29, GFR 61, potassium 4.3, AST 16, ALT 24, cholesterol 126, triglycerides 145, HDL 24, LDL 76, hemoglobin A1c 5.4%  Other Studies Reviewed Today:  No interval cardiac testing for review today.  Assessment and Plan:  1.  CAD status post stent  intervention to the circumflex and RCA in 2004.  Cardiac catheterization in 2023 revealed patent stent sites with mild to moderate residual disease and also more severe stenosis in the apical LAD in small vessel segment not amenable to PCI.  He has been managed medically.  Lexiscan  Myoview  in September 2024 was low risk without frank ischemia.  He remains clinically stable without recurring angina or interval nitroglycerin  use.  Continue aspirin  81 mg daily and Lipitor 20 mg daily.  2.  Primary hypertension.  Continue Cardizem  CD 100 able grams daily, Toprol -XL 25 mg daily, and Cozaar 100 mg daily.  3.  Mixed hyperlipidemia.  Continue Lipitor 20 mg daily for now.  Last LDL 76.  4.  Longstanding tobacco use.  Recent screening chest CT indicated suspicious lung nodule.  Plan is for pet imaging per Pulmonary.  If he does need to undergo VATS/resection, he should be able to proceed from a cardiac perspective based on current clinical stability and low risk ischemic testing within the last year.  Disposition:  Follow up 6 months.  Signed, Gerard Knight, M.D., F.A.C.C. [facility]

## 2024-05-06 NOTE — Progress Notes (Signed)
 It was very I have called the patient with the results of his low-dose screening CT.  His scan was read as a lung RADS 4B, this was a follow-up to a lung RADS 4A done in December 2024.  I explained that the largest and most concerning nodule in the left upper lobe while it has not increased in size much it does show slow interval growth since November 2022.  Because of this is concerning for a low-grade adenocarcinoma of the lung.  For that reason we are can go ahead and move forward with PET scan.  I did discuss this scan with Dr. Baldwin Levee who is in agreement with that plan.  Patient is also in agreement with the plan.  He understands he will get a call to schedule his PET scan and then he will follow-up with me within a week of the PET to review the results and determine next best steps in plan of care.  Lex Redbird, and [place] please fax results to PCP and let him know plan is for PET scan with follow-up in the pulmonary office. Thank you so much

## 2024-05-06 NOTE — Patient Instructions (Addendum)

## 2024-05-12 DIAGNOSIS — I10 Essential (primary) hypertension: Secondary | ICD-10-CM | POA: Diagnosis not present

## 2024-05-12 DIAGNOSIS — I2510 Atherosclerotic heart disease of native coronary artery without angina pectoris: Secondary | ICD-10-CM | POA: Diagnosis not present

## 2024-05-12 DIAGNOSIS — E782 Mixed hyperlipidemia: Secondary | ICD-10-CM | POA: Diagnosis not present

## 2024-05-12 DIAGNOSIS — K219 Gastro-esophageal reflux disease without esophagitis: Secondary | ICD-10-CM | POA: Diagnosis not present

## 2024-05-20 ENCOUNTER — Encounter (HOSPITAL_COMMUNITY)
Admission: RE | Admit: 2024-05-20 | Discharge: 2024-05-20 | Disposition: A | Discharge status: Home / Self Care | Source: Ambulatory Visit | Attending: Acute Care | Admitting: Acute Care

## 2024-05-20 DIAGNOSIS — R911 Solitary pulmonary nodule: Secondary | ICD-10-CM | POA: Diagnosis not present

## 2024-05-20 LAB — GLUCOSE, CAPILLARY: Glucose-Capillary: 95 mg/dL (ref 70–99)

## 2024-05-20 MED ORDER — FLUDEOXYGLUCOSE F - 18 (FDG) INJECTION
11.2800 | Freq: Once | INTRAVENOUS | Status: AC
Start: 2024-05-20 — End: 2024-05-20
  Administered 2024-05-20: 11.28 via INTRAVENOUS

## 2024-05-31 ENCOUNTER — Ambulatory Visit: Admitting: Acute Care

## 2024-05-31 ENCOUNTER — Encounter: Payer: Self-pay | Admitting: Acute Care

## 2024-05-31 ENCOUNTER — Other Ambulatory Visit: Payer: Self-pay | Admitting: Emergency Medicine

## 2024-05-31 VITALS — BP 138/80 | HR 68 | Ht 70.0 in | Wt 224.2 lb

## 2024-05-31 DIAGNOSIS — E041 Nontoxic single thyroid nodule: Secondary | ICD-10-CM | POA: Diagnosis not present

## 2024-05-31 DIAGNOSIS — F17210 Nicotine dependence, cigarettes, uncomplicated: Secondary | ICD-10-CM

## 2024-05-31 DIAGNOSIS — R911 Solitary pulmonary nodule: Secondary | ICD-10-CM | POA: Diagnosis not present

## 2024-05-31 DIAGNOSIS — R946 Abnormal results of thyroid function studies: Secondary | ICD-10-CM | POA: Diagnosis not present

## 2024-05-31 DIAGNOSIS — R918 Other nonspecific abnormal finding of lung field: Secondary | ICD-10-CM

## 2024-05-31 DIAGNOSIS — F17200 Nicotine dependence, unspecified, uncomplicated: Secondary | ICD-10-CM

## 2024-05-31 NOTE — Patient Instructions (Addendum)
 It is good to see you today. Your PET scan showed low level uptake in the lung nodule. We will do a 3 month follow up Low Dose CT Chest.  This will be due the middle of September.  You will get a call to get this scheduled closer to the time it is due.  I have ordered an Ultra Sound of the thyroid.  There is a right sided nodule that was positive on your PET scan.  You will get a call to get that scheduled.  If they recommend biopsy , we will get that ordered.  You will follow up with me after the US  to review the results.  If this does get diagnosed as a cancer , we will move forward with biopsy of the lung nodule.  Call us  if you need us  sooner. Please contact office for sooner follow up if symptoms do not improve or worsen or seek emergency care

## 2024-05-31 NOTE — Progress Notes (Addendum)
 History of Present Illness Daniel Fields is a 68 y.o. male current every day smoker referred for abnormal lung cancer screening scan 05/2024. He will be followed by Dr. Shelah.   05/31/2024 Pt. Presents for  follow up after PET scan for abnormal Lung cancer Screening of the chest. His scan done 04/11/2024 ( read 05/05/2024) was read as a LR 4 B. There is a left upper lobe nodule that has shown some interval growth from 10/2021 to 11/2023. It is concerning for a low-grade pulmonary neoplasm such as adenocarcinoma.Pt does have a history of testicular cancer, for which he had chemo x 6 months in 1999-2000.   PERT scan was ordered, and we have reviewed the results together. The left upper lobe nodule of concern had low grade uptake in PET. Radiology recommended CT Surveillance.Pt. has had no unintentional weight loss. He denies any hemoptysis.  There was however an incidental finding of a right sided 1.3 cm thyroid  nodule. Pt had a Excision of Thyroglossal Duct Cyst 09/2023, and states had has had some pain at that site since. We will order a thyroid  US , and if recommended we will get a biopsy. With patient's cancer history, and this new finding, if the thyroid  does require biopsy, and is + for malignancy, we will move forward with biopsy of the lung.   Pt. Is in agreement with this plan. We discussed he needs to quit smoking completely.    Test Results: PET Scan 05/20/2024 NECK: Chronic bilateral maxillary right ethmoid sinusitis.   Hypermetabolic 1.3 cm right thyroid  nodule on image 49 series 4, maximum SUV 12.6.   Incidental CT findings: None.   CHEST: The nodule of concern in the posterior lingula is faintly visible as a subsolid density measuring about 7 by 5 mm on image 92 of series 4. The lesion has a maximum SUV of 0.9, but may be below sensitive PET-CT size thresholds.   Incidental CT findings: Coronary, aortic arch, and branch vessel atherosclerotic vascular disease.    ABDOMEN/PELVIS: Focal and segmental accentuated bowel signal without CT correlate, likely physiologic.   Incidental CT findings: 1.6 cm hypodense lesion in the lateral segment left hepatic lobe on image 112 series 4, fluid density, presume cyst. No further imaging workup of this lesion is indicated.   Cholecystectomy. Atherosclerosis is present, including aortoiliac atherosclerotic disease. Photopenic fluid density lesions of the kidneys are likely cysts and merit no surveillance imaging.   Descending and sigmoid colon diverticulosis. Absent right spermatic cord and testicle, query orchectomy.   SKELETON: No significant abnormal hypermetabolic activity in this region.   Incidental CT findings: None.   IMPRESSION: 1. The nodule of concern in the posterior lingula is faintly visible as a subsolid density measuring about 7 by 5 mm. The lesion has a maximum SUV of 0.9, but may be below sensitive PET-CT size thresholds. Chest CT surveillance recommended. 2. Hypermetabolic 1.3 cm right thyroid  nodule, maximum SUV 12.6. Recommend thyroid  US  and biopsy (ref: J Am Coll Radiol. 2015 Feb;12(2): 143-50). 3. Chronic bilateral maxillary and right ethmoid sinusitis. 4. Descending and sigmoid colon diverticulosis. 5. Absent right spermatic cord and testicle, query orchectomy. 6.  Aortic Atherosclerosis (ICD10-I70.0).   Low  Dose CT Chest 04/11/2024( read 05/05/2024)  There is no pleural fluid, mild emphysema with diffuse bronchial wall thickening. No pleural fluid or airspace consolidation. Central airways are patent. Multiple pulmonary nodules are again seen and when compared with the previous exam not significantly changed in size. The largest, and most  concerning nodule is again seen within the posterior left upper lobe abutting the oblique fissure, image 209 of series 4. Although not significantly changed in size from 11/12/2023 this shows slow interval growth since 10/01/2021 and is  concerning for low-grade pulmonary neoplasm such as adenocarcinoma. Lung-RADS 4B, suspicious. Additional imaging evaluation or consultation with Pulmonology or Thoracic Surgery recommended. 2. Coronary artery calcifications. 3. Aortic Atherosclerosis (ICD10-I70.0) and Emphysema (ICD10-J43.9). Pt. Is followed by cardiology.     Latest Ref Rng & Units 09/07/2023   11:55 AM 05/07/2022   10:05 AM 05/14/2014   11:53 AM  CBC  WBC 4.0 - 10.5 K/uL 6.7  6.9  6.0   Hemoglobin 13.0 - 17.0 g/dL 84.1  84.4  83.7   Hematocrit 39.0 - 52.0 % 44.6  43.9  44.1   Platelets 150 - 400 K/uL 138  156  126        Latest Ref Rng & Units 09/07/2023   11:55 AM 05/08/2022    1:20 AM 05/07/2022   10:05 AM  BMP  Glucose 70 - 99 mg/dL 94  91  888   BUN 8 - 23 mg/dL 17  11  12    Creatinine 0.61 - 1.24 mg/dL 8.90  8.82  8.82   Sodium 135 - 145 mmol/L 137  140  137   Potassium 3.5 - 5.1 mmol/L 3.9  4.0  3.7   Chloride 98 - 111 mmol/L 106  111  108   CO2 22 - 32 mmol/L 21  23  25    Calcium  8.9 - 10.3 mg/dL 9.0  8.6  9.0     BNP No results found for: BNP  ProBNP No results found for: PROBNP  PFT    Component Value Date/Time   FEV1PRE 2.85 08/29/2015 1319   FEV1POST 2.80 08/29/2015 1319   FVCPRE 3.59 08/29/2015 1319   FVCPOST 3.28 08/29/2015 1319   TLC 6.57 08/29/2015 1319   DLCOUNC 21.77 08/29/2015 1319   PREFEV1FVCRT 80 08/29/2015 1319   PSTFEV1FVCRT 85 08/29/2015 1319    NM PET Image Initial (PI) Skull Base To Thigh Result Date: 05/20/2024 CLINICAL DATA:  Initial treatment strategy for lung nodule. EXAM: NUCLEAR MEDICINE PET SKULL BASE TO THIGH TECHNIQUE: 11.3 mCi F-18 FDG was injected intravenously. Full-ring PET imaging was performed from the skull base to thigh after the radiotracer. CT data was obtained and used for attenuation correction and anatomic localization. Fasting blood glucose: 95 mg/dl COMPARISON:  Chest CT 94/87/7974 FINDINGS: Mediastinal blood pool activity: SUV max 3.0 Liver  activity: SUV max NA NECK: Chronic bilateral maxillary right ethmoid sinusitis. Hypermetabolic 1.3 cm right thyroid  nodule on image 49 series 4, maximum SUV 12.6. Incidental CT findings: None. CHEST: The nodule of concern in the posterior lingula is faintly visible as a subsolid density measuring about 7 by 5 mm on image 92 of series 4. The lesion has a maximum SUV of 0.9, but may be below sensitive PET-CT size thresholds. Incidental CT findings: Coronary, aortic arch, and branch vessel atherosclerotic vascular disease. ABDOMEN/PELVIS: Focal and segmental accentuated bowel signal without CT correlate, likely physiologic. Incidental CT findings: 1.6 cm hypodense lesion in the lateral segment left hepatic lobe on image 112 series 4, fluid density, presume cyst. No further imaging workup of this lesion is indicated. Cholecystectomy. Atherosclerosis is present, including aortoiliac atherosclerotic disease. Photopenic fluid density lesions of the kidneys are likely cysts and merit no surveillance imaging. Descending and sigmoid colon diverticulosis. Absent right spermatic cord and testicle, query orchectomy. SKELETON: No  significant abnormal hypermetabolic activity in this region. Incidental CT findings: None. IMPRESSION: 1. The nodule of concern in the posterior lingula is faintly visible as a subsolid density measuring about 7 by 5 mm. The lesion has a maximum SUV of 0.9, but may be below sensitive PET-CT size thresholds. Chest CT surveillance recommended. 2. Hypermetabolic 1.3 cm right thyroid  nodule, maximum SUV 12.6. Recommend thyroid  US  and biopsy (ref: J Am Coll Radiol. 2015 Feb;12(2): 143-50). 3. Chronic bilateral maxillary and right ethmoid sinusitis. 4. Descending and sigmoid colon diverticulosis. 5. Absent right spermatic cord and testicle, query orchectomy. 6.  Aortic Atherosclerosis (ICD10-I70.0). Electronically Signed   By: Ryan Salvage M.D.   On: 05/20/2024 15:27     Past medical hx Past Medical  History:  Diagnosis Date   Arthritis    Blurred vision    CAD (coronary artery disease)    a. 2004 PCI: DES to LCX/RCA; b. 03/2008 Cath: patent stents, otw nl cors; c. Mult low risk MVs; d. 05/2022 Cath: LM nl, LAD 58m, LCX 40p/m, OM2 10, RCA 60p, 40p/m ISR, 30d, EF 65%-->Med Rx.   Chronic back pain    COPD (chronic obstructive pulmonary disease) (HCC)    Depression    GERD (gastroesophageal reflux disease)    Gout    Hypertension    IBS (irritable bowel syndrome)    Mixed hyperlipidemia    Sleep apnea    CPAP   Testicular cancer (HCC)    Status post resection and chemotherapy, age early 61s     Social History   Tobacco Use   Smoking status: Every Day    Current packs/day: 1.00    Average packs/day: 1 pack/day for 40.0 years (40.0 ttl pk-yrs)    Types: Cigarettes   Smokeless tobacco: Never   Tobacco comments:    1 pack a day,on a bad day up to 1 pack and 1/2 a day  05/31/2024  KRD  Vaping Use   Vaping status: Former  Substance Use Topics   Alcohol use: No    Alcohol/week: 0.0 standard drinks of alcohol   Drug use: No    Mr.Aayansh Codispoti reports that he has been smoking cigarettes. He has a 40 pack-year smoking history. He has never used smokeless tobacco. He reports that he does not drink alcohol and does not use drugs.  Tobacco Cessation: Ready to quit: Not Answered Counseling given: Not Answered Tobacco comments: 1 pack a day,on a bad day up to 1 pack and 1/2 a day  05/31/2024  KRD Current every day smoker , I spent 4 minutes counseling patient on  steps to stop use of tobacco products. I have provided patient with information on receiving free nicotine replacement therapy, and contact numbers for hypnosis for smoking cessation as well as acupuncture for smoking cessation.   Past surgical hx, Family hx, Social hx all reviewed.  Current Outpatient Medications on File Prior to Visit  Medication Sig   allopurinol  (ZYLOPRIM ) 100 MG tablet Take 100 mg by mouth daily.   aspirin   EC 81 MG tablet Take 81 mg by mouth daily.   atorvastatin  (LIPITOR) 20 MG tablet Take 20 mg by mouth at bedtime.   diltiazem  (CARDIZEM  CD) 180 MG 24 hr capsule Take 180 mg by mouth at bedtime.   folic acid (FOLVITE) 1 MG tablet Take 1 mg by mouth at bedtime.   HYDROcodone -acetaminophen  (NORCO) 7.5-325 MG tablet Take 1 tablet by mouth 4 (four) times daily as needed for moderate pain.   hydrocortisone 2.5 %  cream    ketoconazole (NIZORAL) 2 % cream    losartan (COZAAR) 100 MG tablet Take 100 mg by mouth daily.   metoprolol  succinate (TOPROL -XL) 25 MG 24 hr tablet Take 25 mg by mouth at bedtime.   pantoprazole  (PROTONIX ) 40 MG tablet Take 40 mg by mouth at bedtime.   Potassium Chloride  ER 20 MEQ TBCR Take 20 mEq by mouth daily.   nitroGLYCERIN  (NITROSTAT ) 0.4 MG SL tablet Place 1 tablet (0.4 mg total) under the tongue every 5 (five) minutes as needed. (Patient not taking: Reported on 05/31/2024)   No current facility-administered medications on file prior to visit.     No Known Allergies  Review Of Systems:  Constitutional:   No  weight loss, night sweats,  Fevers, chills, fatigue, or  lassitude.  HEENT:   No headaches,  Difficulty swallowing,  Tooth/dental problems, or  Sore throat,                No sneezing, itching, ear ache, nasal congestion, post nasal drip, + tenderness to central/ left throat, nodule palpable on L  CV:  No chest pain,  Orthopnea, PND, swelling in lower extremities, anasarca, dizziness, palpitations, syncope.   GI  No heartburn, indigestion, abdominal pain, nausea, vomiting, diarrhea, change in bowel habits, loss of appetite, bloody stools.   Resp: No shortness of breath with exertion or at rest.  No excess mucus, no productive cough,  No non-productive cough,  No coughing up of blood.  No change in color of mucus.  No wheezing.  No chest wall deformity  Skin: no rash or lesions.  GU: no dysuria, change in color of urine, no urgency or frequency.  No flank pain, no  hematuria   MS:  No joint pain or swelling.  No decreased range of motion.  No back pain.  Psych:  No change in mood or affect. No depression or anxiety.  No memory loss.   Vital Signs BP 138/80 (BP Location: Right Arm, Patient Position: Sitting, Cuff Size: Normal)   Pulse 68   Ht 5' 10 (1.778 m)   Wt 224 lb 3.2 oz (101.7 kg)   SpO2 99%   BMI 32.17 kg/m    Physical Exam:  General- No distress,  A&Ox3, pleasant  ENT: No sinus tenderness, TM clear, pale nasal mucosa, no oral exudate,no post nasal drip, no LAN Cardiac: S1, S2, regular rate and rhythm, no murmur Chest: No wheeze/ rales/ dullness; no accessory muscle use, no nasal flaring, no sternal retractions Abd.: Soft Non-tender, ND, BS +, Body mass index is 32.17 kg/m.  Ext: No clubbing cyanosis, edema, no obvious deformities Neuro:  normal strength, MASE x 4, A&O x 3, appropriate Skin: No rashes, warm and dry, no obvious skin lesions  Psych: normal mood and behavior   Assessment/Plan Abnormal Lung Cancer Screening Nodule of concern with low uptake on PET Current every day smoker  Thyroid  nodule PET avid Plan Your PET scan showed low level uptake in the lung nodule. We will do a 3 month follow up Low Dose CT Chest.  This will be due the middle of September.  You will get a call to get this scheduled closer to the time it is due.  I have ordered an Ultra Sound of the thyroid .  There is a right sided nodule that was positive on your PET scan.  You will get a call to get that scheduled.  If they recommend biopsy , we will get that ordered.  You will  follow up with me after the US  to review the results.  If this does get diagnosed as a cancer , we will move forward with biopsy of the lung nodule.  Call us  if you need us  sooner. Please contact office for sooner follow up if symptoms do not improve or worsen or seek emergency care    I spent 35 minutes dedicated to the care of this patient on the date of this encounter  to include pre-visit review of records, face-to-face time with the patient discussing conditions above, post visit ordering of testing, clinical documentation with the electronic health record, making appropriate referrals as documented, and communicating necessary information to the patient's healthcare team.   Lauraine JULIANNA Lites, NP 05/31/2024  10:46 AM

## 2024-06-13 ENCOUNTER — Ambulatory Visit (HOSPITAL_COMMUNITY)
Admission: RE | Admit: 2024-06-13 | Discharge: 2024-06-13 | Disposition: A | Discharge status: Home / Self Care | Source: Ambulatory Visit | Attending: Acute Care | Admitting: Acute Care

## 2024-06-13 DIAGNOSIS — E041 Nontoxic single thyroid nodule: Secondary | ICD-10-CM | POA: Diagnosis not present

## 2024-06-13 DIAGNOSIS — E042 Nontoxic multinodular goiter: Secondary | ICD-10-CM | POA: Diagnosis not present

## 2024-06-15 ENCOUNTER — Telehealth: Payer: Self-pay | Admitting: Acute Care

## 2024-06-15 NOTE — Telephone Encounter (Signed)
 US  of the thyroid  reviewed. Recommendation is for 12 month folow up US  Thyroid . I have faxed results to PCP and asked him to follow up with the recommended 2026 scan. Ladies, can you call the patient and let him know? Make sure he knows his PCP will be doing the follow up management of the thyroid . Thanks

## 2024-06-16 NOTE — Telephone Encounter (Signed)
 Called and spoke to pt. Informed him of the results of the thyroid  US . Patient aware his PCP would be doing the yearly follow up US  next year. Patient verbalized understanding of results and denied any questions at the time of the call. Patient has his 3 month LDCT on 9/19 and OV with SG on 9/26. Patient aware to keep these appts. Nothing further needed at this time.

## 2024-06-21 ENCOUNTER — Encounter: Payer: Self-pay | Admitting: Acute Care

## 2024-06-21 ENCOUNTER — Ambulatory Visit: Admitting: Acute Care

## 2024-06-21 VITALS — BP 138/70 | HR 66 | Ht 70.0 in | Wt 222.8 lb

## 2024-06-21 DIAGNOSIS — F17200 Nicotine dependence, unspecified, uncomplicated: Secondary | ICD-10-CM

## 2024-06-21 DIAGNOSIS — Z87898 Personal history of other specified conditions: Secondary | ICD-10-CM

## 2024-06-21 DIAGNOSIS — E041 Nontoxic single thyroid nodule: Secondary | ICD-10-CM | POA: Diagnosis not present

## 2024-06-21 NOTE — Patient Instructions (Addendum)
 It is good to see you today. I have ordered a biopsy of the thyroid  nodule . You will get a call from the interventional radiology department at [facility] to get this scheduled.  Call the office if you have not heard from interventional radiology by Friday.   You will follow up with me after the procedure to review results.  You will get a call to get this scheduled once we know the date of the biopsy.  Hold aspirin  48 hours before the procedure.  Call if you need us  sooner.  Please contact office for sooner follow up if symptoms do not improve or worsen or seek emergency care

## 2024-06-21 NOTE — Progress Notes (Signed)
 History of Present Illness Daniel Fields is a 68 y.o. male current every day smoker referred for abnormal lung cancer screening scan 05/2024, as well as a thyroid  nodule positive on PET. He will be followed by Dr. Shelah.   06/21/2024 Pt. Presents for follow up after US  of his thyroid . This was positive on PET imaging  done to evaluate pulmonary nodules. The pulmonary nodules were minimally PET avid, but his thyroid  was significantly PET + with an SUV of 12.6 and a 1.3 cm right sided nodule. US  was done and it measured larger on the US  in  a short period of time, so rather than doing a 1 year follow up US  we have decided to move forward with biopsy now.  We reviewed the US  results and Daniel Fields to move forward with biopsy.  I have placed a referral to interventional radiology and an order for ultrasound-guided biopsy.  Patient understands he will get a call to get this scheduled.  He will follow-up with me after the procedure to review the results of the cytology.  He understands he needs to hold his aspirin  for 48 hours prior to the procedure.   He takes aspirin  81 mg daily. He lives in [place] but travels to [place] regularly for medical appointments. No use of blood thinners other than aspirin . We discussed we will need to hold ASA x 48 hours prior to the procedure.  He had no further questions at completion of the office visit.  Test Results: 06/12/2024 US  Thyroid  IMPRESSION: 1. A TR 3 nodule is present in the right thyroid  measuring 1.9 cm. *Given size (>/= 1.5 - 2.4 cm) and appearance, a follow-up ultrasound in 1 year should be considered based on TI-RADS criteria.  IMPRESSION: 1. The nodule of concern in the posterior lingula is faintly visible as a subsolid density measuring about 7 by 5 mm. The lesion has a maximum SUV of 0.9, but may be below sensitive PET-CT size thresholds. Chest CT surveillance recommended. 2. Hypermetabolic 1.3 cm right thyroid  nodule, maximum  SUV 12.6. Recommend thyroid  US  and biopsy (ref: J Am Coll Radiol. 2015 Feb;12(2): 143-50). 3. Chronic bilateral maxillary and right ethmoid sinusitis. 4. Descending and sigmoid colon diverticulosis. 5. Absent right spermatic cord and testicle, query orchectomy. 6.  Aortic Atherosclerosis (ICD10-I70.0).     Low  Dose CT Chest 04/11/2024( read 05/05/2024)  There is no pleural fluid, mild emphysema with diffuse bronchial wall thickening. No pleural fluid or airspace consolidation. Central airways are patent. Multiple pulmonary nodules are again seen and when compared with the previous exam not significantly changed in size. The largest, and most concerning nodule is again seen within the posterior left upper lobe abutting the oblique fissure, image 209 of series 4. Although not significantly changed in size from 11/12/2023 this shows slow interval growth since 10/01/2021 and is concerning for low-grade pulmonary neoplasm such as adenocarcinoma. Lung-RADS 4B, suspicious. Additional imaging evaluation or consultation with Pulmonology or Thoracic Surgery recommended. 2. Coronary artery calcifications. 3. Aortic Atherosclerosis (ICD10-I70.0) and Emphysema (ICD10-J43.9). Pt. Is followed by cardiology.    Latest Ref Rng & Units 09/07/2023   11:55 AM 05/07/2022   10:05 AM 05/14/2014   11:53 AM  CBC  WBC 4.0 - 10.5 K/uL 6.7  6.9  6.0   Hemoglobin 13.0 - 17.0 g/dL 84.1  84.4  83.7   Hematocrit 39.0 - 52.0 % 44.6  43.9  44.1   Platelets 150 - 400 K/uL 138  156  126        Latest Ref Rng & Units 09/07/2023   11:55 AM 05/08/2022    1:20 AM 05/07/2022   10:05 AM  BMP  Glucose 70 - 99 mg/dL 94  91  888   BUN 8 - 23 mg/dL 17  11  12    Creatinine 0.61 - 1.24 mg/dL 8.90  8.82  8.82   Sodium 135 - 145 mmol/L 137  140  137   Potassium 3.5 - 5.1 mmol/L 3.9  4.0  3.7   Chloride 98 - 111 mmol/L 106  111  108   CO2 22 - 32 mmol/L 21  23  25    Calcium  8.9 - 10.3 mg/dL 9.0  8.6  9.0     BNP No results  found for: BNP  ProBNP No results found for: PROBNP  PFT    Component Value Date/Time   FEV1PRE 2.85 08/29/2015 1319   FEV1POST 2.80 08/29/2015 1319   FVCPRE 3.59 08/29/2015 1319   FVCPOST 3.28 08/29/2015 1319   TLC 6.57 08/29/2015 1319   DLCOUNC 21.77 08/29/2015 1319   PREFEV1FVCRT 80 08/29/2015 1319   PSTFEV1FVCRT 85 08/29/2015 1319    US  THYROID  Result Date: 06/13/2024 CLINICAL DATA:  Right-sided thyroid  nodule EXAM: THYROID  ULTRASOUND TECHNIQUE: Ultrasound examination of the thyroid  gland and adjacent soft tissues was performed. COMPARISON:  None Available. FINDINGS: Parenchymal Echotexture: Mildly heterogenous Isthmus: 0.5 cm Right lobe: 4.9 x 2.1 x 1.8 cm Left lobe: 4.6 x 1.8 x 1.4 cm _________________________________________________________ Estimated total number of nodules >/= 1 cm: 1 Number of spongiform nodules >/=  2 cm not described below (TR1): 0 Number of mixed cystic and solid nodules >/= 1.5 cm not described below (TR2): 0 _________________________________________________________ Nodule # 1: Location: Right; Mid Maximum size: 1.9 cm; Other 2 dimensions: 1.4 x 1.5 cm Composition: solid/almost completely solid (2) Echogenicity: isoechoic (1) Shape: not taller-than-wide (0) Margins: smooth (0) Echogenic foci: none (0) ACR TI-RADS total points: 3. ACR TI-RADS risk category: TR3 (3 points). ACR TI-RADS recommendations: *Given size (>/= 1.5 - 2.4 cm) and appearance, a follow-up ultrasound in 1 year should be considered based on TI-RADS criteria. _________________________________________________________ IMPRESSION: 1. A TR 3 nodule is present in the right thyroid  measuring 1.9 cm. *Given size (>/= 1.5 - 2.4 cm) and appearance, a follow-up ultrasound in 1 year should be considered based on TI-RADS criteria. The above is in keeping with the ACR TI-RADS recommendations - J Am Coll Radiol 2017;14:587-595. Electronically Signed   By: Maude Naegeli M.D.   On: 06/13/2024 13:48     Past  medical hx Past Medical History:  Diagnosis Date   Arthritis    Blurred vision    CAD (coronary artery disease)    a. 2004 PCI: DES to LCX/RCA; b. 03/2008 Cath: patent stents, otw nl cors; c. Mult low risk MVs; d. 05/2022 Cath: LM nl, LAD 58m, LCX 40p/m, OM2 10, RCA 60p, 40p/m ISR, 30d, EF 65%-->Med Rx.   Chronic back pain    COPD (chronic obstructive pulmonary disease) (HCC)    Depression    GERD (gastroesophageal reflux disease)    Gout    Hypertension    IBS (irritable bowel syndrome)    Mixed hyperlipidemia    Sleep apnea    CPAP   Testicular cancer (HCC)    Status post resection and chemotherapy, age early 35s     Social History   Tobacco Use   Smoking status: Every Day    Current packs/day: 1.00  Average packs/day: 1 pack/day for 40.0 years (40.0 ttl pk-yrs)    Types: Cigarettes   Smokeless tobacco: Never   Tobacco comments:    1 pack a day,on a bad day up to 1 pack and 1/2 a day  05/31/2024  KRD  Vaping Use   Vaping status: Former  Substance Use Topics   Alcohol use: No    Alcohol/week: 0.0 standard drinks of alcohol   Drug use: No    DanielSkylur Fields reports that he has been smoking cigarettes. He has a 40 pack-year smoking history. He has never used smokeless tobacco. He reports that he does not drink alcohol and does not use drugs.  Tobacco Cessation: Ready to quit: Not Answered Counseling given: Not Answered Tobacco comments: 1 pack a day,on a bad day up to 1 pack and 1/2 a day  05/31/2024  KRD Current every day smoker , I spent 3-4 minutes counseling patient on  steps to stop use of tobacco products. I have provided patient with information on receiving free nicotine replacement therapy, and contact numbers for hypnosis for smoking cessation as well as acupuncture for smoking cessation.   Past surgical hx, Family hx, Social hx all reviewed.  Current Outpatient Medications on File Prior to Visit  Medication Sig   allopurinol  (ZYLOPRIM ) 100 MG tablet Take 100 mg  by mouth daily.   aspirin  EC 81 MG tablet Take 81 mg by mouth daily.   atorvastatin  (LIPITOR) 20 MG tablet Take 20 mg by mouth at bedtime.   diltiazem  (CARDIZEM  CD) 180 MG 24 hr capsule Take 180 mg by mouth at bedtime.   folic acid (FOLVITE) 1 MG tablet Take 1 mg by mouth at bedtime.   HYDROcodone -acetaminophen  (NORCO) 7.5-325 MG tablet Take 1 tablet by mouth 4 (four) times daily as needed for moderate pain.   hydrocortisone 2.5 % cream    ketoconazole (NIZORAL) 2 % cream    losartan (COZAAR) 100 MG tablet Take 100 mg by mouth daily.   metoprolol  succinate (TOPROL -XL) 25 MG 24 hr tablet Take 25 mg by mouth at bedtime.   nitroGLYCERIN  (NITROSTAT ) 0.4 MG SL tablet Place 1 tablet (0.4 mg total) under the tongue every 5 (five) minutes as needed.   pantoprazole  (PROTONIX ) 40 MG tablet Take 40 mg by mouth at bedtime.   Potassium Chloride  ER 20 MEQ TBCR Take 20 mEq by mouth daily.   No current facility-administered medications on file prior to visit.     No Known Allergies  Review Of Systems:  Constitutional:   No  weight loss, night sweats,  Fevers, chills, fatigue, or  lassitude.  HEENT:   No headaches,  Difficulty swallowing,  Tooth/dental problems, or  Sore throat,                No sneezing, itching, ear ache, nasal congestion, post nasal drip,   CV:  No chest pain,  Orthopnea, PND, swelling in lower extremities, anasarca, dizziness, palpitations, syncope.   GI  No heartburn, indigestion, abdominal pain, nausea, vomiting, diarrhea, change in bowel habits, loss of appetite, bloody stools.   Resp: No shortness of breath with exertion or at rest.  No excess mucus, no productive cough,  No non-productive cough,  No coughing up of blood.  No change in color of mucus.  No wheezing.  No chest wall deformity  Skin: no rash or lesions.  GU: no dysuria, change in color of urine, no urgency or frequency.  No flank pain, no hematuria   MS:  No joint pain or swelling.  No decreased range of  motion.  No back pain.  Psych:  No change in mood or affect. No depression or anxiety.  No memory loss.   Vital Signs BP 138/70 (BP Location: Right Arm, Patient Position: Sitting, Cuff Size: Normal)   Pulse 66   Ht 5' 10 (1.778 m)   Wt 222 lb 12.8 oz (101.1 kg)   SpO2 99%   BMI 31.97 kg/m    Physical Exam:  General- No distress,  A&Ox3, pleasant ENT: No sinus tenderness, TM clear, pale nasal mucosa, no oral exudate,no post nasal drip, no LAN, tenderness to left side of larynx. Cardiac: S1, S2, regular rate and rhythm, no murmur Chest: No wheeze/ rales/ dullness; no accessory muscle use, no nasal flaring, no sternal retractions Abd.: Soft Non-tender, nondistended, bowel sounds positive,Body mass index is 31.97 kg/m.  Ext: No clubbing cyanosis, edema, no obvious deformities Neuro:  normal strength, moving all extremities x 4, alert and oriented x 3, appropriate Skin: No rashes, warm and dry, skin lesions Psych: normal mood and behavior   Assessment/Plan Thyroid  nodule positive on PET scan Current everyday smoker Plan I have ordered a biopsy of the thyroid  nodule . You will get a call from the interventional radiology department at [facility] to get this scheduled.  Call the office if you have not heard from interventional radiology by Friday.   You will follow up with me after the procedure to review results.  You will get a call to get this scheduled once we know the date of the biopsy.  Hold aspirin  48 hours before the procedure.  Call if you need us  sooner.  Follow-up low-dose CT due September 2025 as follow-up pulmonary nodules Please contact office for sooner follow up if symptoms do not improve or worsen or seek emergency care  I spent 35 minutes dedicated to the care of this patient on the date of this encounter to include pre-visit review of records, face-to-face time with the patient discussing conditions above, post visit ordering of testing, clinical documentation with  the electronic health record, making appropriate referrals as documented, and communicating necessary information to the patient's healthcare team.      Lauraine JULIANNA Lites, NP 06/21/2024  9:07 AM

## 2024-06-22 NOTE — Progress Notes (Signed)
 I agree with the plans as outlined above.   Lamar Chris, MD, PhD 06/22/2024, 1:46 PM [facility] [phone] or if no answer before 7:00PM call [phone] For any issues after 7:00PM please call eLink [phone]

## 2024-06-23 ENCOUNTER — Encounter (HOSPITAL_COMMUNITY): Payer: Self-pay

## 2024-06-23 NOTE — Progress Notes (Signed)
 Johann Sieving, MD  Deen Deguia Regular , yes       Previous Messages    ----- Message ----- From: Amore Grater Sent: 06/23/2024  10:40 AM EDT To: Sieving Johann, MD Subject: RE: US  Guided Needle Placement                Would this be like a regular thyroid  bx to be scheduled w/ [facility] or [facility] instead of hospital ? ----- Message ----- From: Johann Sieving, MD Sent: 06/23/2024   9:04 AM EDT To: Eleanor Mighty Subject: RE: US  Guided Needle Placement                PROCEDURE / BIOPSY REVIEW Date: 06/23/24  Requested Biopsy site: R nodule Reason for request: PET+ Imaging review: Best seen on US  06/13/24  Decision: Approved Imaging modality to perform: Ultrasound Schedule with: No sedation / Local anesthetic Schedule for: Any VIR  Additional comments:   Please contact me with questions, concerns, or if issue pertaining to this request arise.  Dayne Sieving Johann, MD [facility] ----- Message ----- From: Tymeir Weathington Sent: 06/22/2024  12:27 PM EDT To: Porschea Borys; Ir Procedure Requests Subject: US  Guided Needle Placement                    Procedure : US  Guided Needle Placement  Reason :thyroid  nodule Dx: Thyroid  nodule greater than or equal to 1.5 cm in diameter incidentally noted on imaging study [E04.1 (ICD-10-CM)]  Thyroid  nodule hot on PET scan . Thanks  History : US  thyroid  , NM PET initial skull base to thigh , CT chest nodule  Provider : Ruthell Lauraine FALCON, NP  Contact :  [phone]

## 2024-07-13 ENCOUNTER — Ambulatory Visit
Admission: RE | Admit: 2024-07-13 | Discharge: 2024-07-13 | Disposition: A | Discharge status: Home / Self Care | Source: Ambulatory Visit | Attending: Acute Care | Admitting: Acute Care

## 2024-07-13 ENCOUNTER — Telehealth: Payer: Self-pay | Admitting: Acute Care

## 2024-07-13 ENCOUNTER — Other Ambulatory Visit (HOSPITAL_COMMUNITY)
Admission: RE | Admit: 2024-07-13 | Discharge: 2024-07-13 | Disposition: A | Discharge status: Home / Self Care | Source: Ambulatory Visit | Attending: Acute Care | Admitting: Acute Care

## 2024-07-13 DIAGNOSIS — E041 Nontoxic single thyroid nodule: Secondary | ICD-10-CM | POA: Diagnosis not present

## 2024-07-13 NOTE — Telephone Encounter (Signed)
 Pt had his biopsy today. Can we please watch for his results? It was a thyroid  biopsy as it lit up on his PET scan. Thanks so much

## 2024-07-14 NOTE — Telephone Encounter (Signed)
 Reminder set for biopsy results.

## 2024-07-15 LAB — CYTOLOGY - NON PAP

## 2024-07-19 ENCOUNTER — Telehealth: Payer: Self-pay

## 2024-07-19 NOTE — Telephone Encounter (Signed)
 Copied from CRM #[id number]. Topic: Clinical - Lab/Test Results >> Jul 19, 2024  3:29 PM Rozanna MATSU wrote: Reason for CRM: PT CALLING FOR HIS RESULTS FROM HIS US  THYROID  BIOPSY   Spoke w/ PT verbalized understanding  waiting on review of results     -NFN

## 2024-07-21 ENCOUNTER — Telehealth: Payer: Self-pay

## 2024-07-21 NOTE — Telephone Encounter (Signed)
-----   Message from Lauraine JULIANNA Lites sent at 07/21/2024 11:32 AM EDT ----- Regarding: FW: Thyroid  Biopsy Results Can you call Daniel Fields and let him know his thyroid  biopsy was negative for cancer which is great news. He has follow up with me 08/26/2024. Thanks so much ----- Message ----- From: Belvie Isaiah HERO, RN Sent: 07/21/2024   7:56 AM EDT To: Lauraine JULIANNA Lites, NP; [facility] Lung Nodule Pool Subject: Thyroid  Biopsy Results                         Hi Sarah  Thyroid  results for Daniel Fields have resulted.  Isaiah   FINAL MICROSCOPIC DIAGNOSIS:  - Benign follicular nodule

## 2024-07-21 NOTE — Telephone Encounter (Signed)
 Spoke with patient. Reviewed recent thyroid  biopsy results. Patient will follow up 08/26/2024 as planned.

## 2024-07-25 ENCOUNTER — Telehealth: Payer: Self-pay

## 2024-07-25 ENCOUNTER — Telehealth: Payer: Self-pay | Admitting: Acute Care

## 2024-07-25 NOTE — Telephone Encounter (Signed)
 Please call patient and let him know his thyroid  biopsy was negative for malignancy. I will let his PCP know. He will need to follow up with him regarding follow up of the thyroid .  We will do the 3 month follow up CT Chest 08/2024, as planned to keep an eye on the lung nodule of concern, he will need a follow up appointment with me 1-2 weeks after the scab to review results. Thanks Jackson. SABRA      FINAL MICROSCOPIC DIAGNOSIS:  - Benign follicular nodule (Bethesda category II)  Specimen Submitted: A. THYROID , RT MID, FINE NEEDLE ASPIRATION FINAL MICROSCOPIC DIAGNOSIS: - Benign follicular nodule (Bethesda category II) SPECIMEN ADEQUACY: Satisfactory for evaluation GROSS: Received is/are 30 cc's of clear, pale pink cytolyt solution. ([facility]:emh) Prepared: Smears: 0 Concentration Method (Thin Prep): 1 Cell Block: Cell block attempted, not obtained. Additional Studies: Afirma collected. Final Diagnosis performed by Pepper Dutton, MD. Electronically signed 07/15/2024

## 2024-07-25 NOTE — Telephone Encounter (Signed)
 Cytology results are back on patient thyroid  biopsy

## 2024-07-26 NOTE — Telephone Encounter (Signed)
 Called and spoke with patient relayed results,already has appointment,NFN

## 2024-08-10 DIAGNOSIS — K080 Exfoliation of teeth due to systemic causes: Secondary | ICD-10-CM | POA: Diagnosis not present

## 2024-08-19 ENCOUNTER — Ambulatory Visit (HOSPITAL_COMMUNITY)
Admission: RE | Admit: 2024-08-19 | Discharge: 2024-08-19 | Disposition: A | Discharge status: Home / Self Care | Source: Ambulatory Visit | Attending: Acute Care | Admitting: Acute Care

## 2024-08-19 DIAGNOSIS — R911 Solitary pulmonary nodule: Secondary | ICD-10-CM | POA: Insufficient documentation

## 2024-08-19 DIAGNOSIS — Z122 Encounter for screening for malignant neoplasm of respiratory organs: Secondary | ICD-10-CM | POA: Diagnosis not present

## 2024-08-25 ENCOUNTER — Telehealth: Payer: Self-pay

## 2024-08-25 NOTE — Telephone Encounter (Signed)
 Attempted to call patient and review recent CT results, nodule of concern has slightly shrunk. LVM to call office and review results.   IMPRESSION: Lung-RADS 3, probably benign findings. Short-term follow-up in 6 months is recommended with repeat low-dose chest CT without contrast (please use the following order, CT CHEST LCS NODULE FOLLOW-UP W/O CM). No change in posterior lingular 7. 4 mm nodule.   Aortic atherosclerosis (ICD10-I70.0), coronary artery atherosclerosis and emphysema (ICD10-J43.9).

## 2024-08-26 ENCOUNTER — Ambulatory Visit: Admitting: Acute Care

## 2024-08-26 ENCOUNTER — Encounter: Payer: Self-pay | Admitting: Acute Care

## 2024-08-26 ENCOUNTER — Other Ambulatory Visit: Payer: Self-pay

## 2024-08-26 VITALS — BP 147/88 | HR 62 | Temp 98.4°F | Ht 70.0 in | Wt 220.6 lb

## 2024-08-26 DIAGNOSIS — Z122 Encounter for screening for malignant neoplasm of respiratory organs: Secondary | ICD-10-CM

## 2024-08-26 DIAGNOSIS — F17210 Nicotine dependence, cigarettes, uncomplicated: Secondary | ICD-10-CM | POA: Diagnosis not present

## 2024-08-26 DIAGNOSIS — F17200 Nicotine dependence, unspecified, uncomplicated: Secondary | ICD-10-CM

## 2024-08-26 DIAGNOSIS — R911 Solitary pulmonary nodule: Secondary | ICD-10-CM | POA: Diagnosis not present

## 2024-08-26 DIAGNOSIS — J439 Emphysema, unspecified: Secondary | ICD-10-CM | POA: Diagnosis not present

## 2024-08-26 DIAGNOSIS — R9389 Abnormal findings on diagnostic imaging of other specified body structures: Secondary | ICD-10-CM | POA: Diagnosis not present

## 2024-08-26 DIAGNOSIS — Z87891 Personal history of nicotine dependence: Secondary | ICD-10-CM

## 2024-08-26 DIAGNOSIS — J449 Chronic obstructive pulmonary disease, unspecified: Secondary | ICD-10-CM

## 2024-08-26 NOTE — Telephone Encounter (Signed)
 Pt was seen by Ruthell, NP this morning in the clinic, results reviewed at that time. 6 month follow up scan has been scheduled for 02/18/2024, order placed.

## 2024-08-26 NOTE — Progress Notes (Signed)
 History of Present Illness Daniel Fields is a 68 y.o. male current everyday smoker followed to the lung screening program, presenting today for referral for abnormal lung cancer screening scan 04/19/2024.  He will be followed by both Dr. Shelah and Lauraine Lites NP Patient has a history of testicular cancer treated with chemotherapy.  08/26/2024 Discussed the use of AI scribe software for clinical note transcription with the patient, who gave verbal consent to proceed.  History of Present Illness Daniel Fields is a 68 year old male with pulmonary nodules and mild emphysema who presents for follow-up of a pulmonary nodule.  He has been under surveillance for a pulmonary nodule initially detected in 2022. The nodule measured 3.5 mm in 2022, increased to 4.1 mm in 2023, 7.7 mm in 2024, and 8 mm in May 2025.  A PET scan was done to further evaluate this slowly growing pulmonary nodule.  PET scan showed the nodule of concern had an SUV max of 0.9 , however there was a hypermetabolic 1.3 cm right thyroid  nodule with a SUV of 12.6.  Patient was subsequently sent for ultrasound of the thyroid  which was followed by biopsy of the thyroid .  Biopsy of the thyroid  was negative for malignancy.  I have asked the patient speak with his primary care physician regarding continue follow-up of his thyroid .  Plan was to continue to monitor the pulmonary nodule at a 64-month interval.  The most recent CT scan done 08/19/2024 shows a slight decrease in size of the pulmonary nodule to 7.4 mm. Other pulmonary nodules are stable and very small, measuring around 2.9 mm.  This was read as a lung RADS 3, again this is reassuring.    He experiences no shortness of breath unless engaging in strenuous activity and is not on any inhalers.  He has a history of a thyroid  nodule that was biopsied on July 13, 2024, with benign results. He also underwent a biopsy for a neck mass and colon polyps, with the latter being followed up  annually since January 2025.  He reports persistent nausea, which he manages by eating to alleviate the sensation. Despite this, he has not experienced significant weight loss, losing only about three pounds recently. He has a history of chemotherapy during which he maintained his weight despite nausea.  He has a hepatic cyst measuring 1.2 cm. He has a history of smoking and continues to smoke, despite previous pulmonary function tests in 2016 showing mild ventilatory deficit but not qualifying for COPD at that time.  We reviewed the results of the CT scan together.  The fact that the pulmonary nodule of concern has decreased in size is reassuring.  Plan will be for 28-month follow-up CT which would be due March 2026.  Patient understands to call sooner for any unexplained weight loss or hemoptysis.     Test Results: Low-dose CT chest 08/19/2024 No pleural fluid. Mild centrilobular emphysema. The posterior lingular nodule of interest is similar at 7.4 mm on image 199/4.   Other pulmonary nodules of maximally 2.9 mm are unchanged.   Upper Abdomen: Segment 2 1.2 cm hepatic cyst. Cholecystectomy. Normal imaged portions of the spleen, stomach, pancreas, adrenal glands, left kidney. Abdominal aortic atherosclerosis.   Musculoskeletal: No acute osseous abnormality.   IMPRESSION: Lung-RADS 3, probably benign findings. Short-term follow-up in 6 months is recommended with repeat low-dose chest CT without contrast   Nuclear medicine PET image initial skull base to thigh 05/20/2024 The nodule of  concern in the posterior lingula is faintly visible as a subsolid density measuring about 7 by 5 mm. The lesion has a maximum SUV of 0.9, but may be below sensitive PET-CT size thresholds. Chest CT surveillance recommended. 2. Hypermetabolic 1.3 cm right thyroid  nodule, maximum SUV 12.6. Recommend thyroid  US  and biopsy (ref: J Am Coll Radiol. 2015 Feb;12(2): 143-50). 3. Chronic bilateral maxillary and  right ethmoid sinusitis. 4. Descending and sigmoid colon diverticulosis. 5. Absent right spermatic cord and testicle, query orchectomy. 6.  Aortic Atherosclerosis (ICD10-I70.0).  CT chest low-dose screening CT 04/11/2024 Mediastinum/Nodes: Thyroid  gland, trachea and esophagus appear normal. No mediastinal or hilar adenopathy.   Lungs/Pleura: There is no pleural fluid, mild emphysema with diffuse bronchial wall thickening. No pleural fluid or airspace consolidation. Central airways are patent. Multiple pulmonary nodules are again seen and when compared with the previous exam not significantly changed in size. The largest, and most concerning nodule is again seen within the posterior left upper lobe abutting the oblique fissure, image 209 of series 4. Although not significantly changed in size from 11/12/2023 this shows slow interval growth since 10/01/2021 and is concerning for low-grade pulmonary neoplasm such as adenocarcinoma.     Latest Ref Rng & Units 09/07/2023   11:55 AM 05/07/2022   10:05 AM 05/14/2014   11:53 AM  CBC  WBC 4.0 - 10.5 K/uL 6.7  6.9  6.0   Hemoglobin 13.0 - 17.0 g/dL 84.1  84.4  83.7   Hematocrit 39.0 - 52.0 % 44.6  43.9  44.1   Platelets 150 - 400 K/uL 138  156  126        Latest Ref Rng & Units 09/07/2023   11:55 AM 05/08/2022    1:20 AM 05/07/2022   10:05 AM  BMP  Glucose 70 - 99 mg/dL 94  91  888   BUN 8 - 23 mg/dL 17  11  12    Creatinine 0.61 - 1.24 mg/dL 8.90  8.82  8.82   Sodium 135 - 145 mmol/L 137  140  137   Potassium 3.5 - 5.1 mmol/L 3.9  4.0  3.7   Chloride 98 - 111 mmol/L 106  111  108   CO2 22 - 32 mmol/L 21  23  25    Calcium  8.9 - 10.3 mg/dL 9.0  8.6  9.0     BNP No results found for: BNP  ProBNP No results found for: PROBNP  PFT    Component Value Date/Time   FEV1PRE 2.85 08/29/2015 1319   FEV1POST 2.80 08/29/2015 1319   FVCPRE 3.59 08/29/2015 1319   FVCPOST 3.28 08/29/2015 1319   TLC 6.57 08/29/2015 1319   DLCOUNC 21.77  08/29/2015 1319   PREFEV1FVCRT 80 08/29/2015 1319   PSTFEV1FVCRT 85 08/29/2015 1319    CT CHEST LCS NODULE F/U LOW DOSE WO CONTRAST Result Date: 08/25/2024 CLINICAL DATA:  Short-term follow-up of lung cancer screening exam. EXAM: CT CHEST WITHOUT CONTRAST FOR LUNG CANCER SCREENING NODULE FOLLOW-UP TECHNIQUE: Multidetector CT imaging of the chest was performed following the standard protocol without IV contrast. RADIATION DOSE REDUCTION: This exam was performed according to the departmental dose-optimization program which includes automated exposure control, adjustment of the mA and/or kV according to patient size and/or use of iterative reconstruction technique. COMPARISON:  04/11/2024 CT and 05/20/2024 PET. FINDINGS: Cardiovascular: Aortic atherosclerosis. Normal heart size, without pericardial effusion. Three vessel coronary artery calcification. Mediastinum/Nodes: No mediastinal or hilar adenopathy, given limitations of unenhanced CT. Lungs/Pleura: No pleural fluid. Mild centrilobular emphysema.  The posterior lingular nodule of interest is similar at 7.4 mm on image 199/4. Other pulmonary nodules of maximally 2.9 mm are unchanged. Upper Abdomen: Segment 2 1.2 cm hepatic cyst. Cholecystectomy. Normal imaged portions of the spleen, stomach, pancreas, adrenal glands, left kidney. Abdominal aortic atherosclerosis. Musculoskeletal: No acute osseous abnormality. IMPRESSION: Lung-RADS 3, probably benign findings. Short-term follow-up in 6 months is recommended with repeat low-dose chest CT without contrast (please use the following order, CT CHEST LCS NODULE FOLLOW-UP W/O CM). No change in posterior lingular 7. 4 mm nodule. Aortic atherosclerosis (ICD10-I70.0), coronary artery atherosclerosis and emphysema (ICD10-J43.9). Electronically Signed   By: Rockey Kilts M.D.   On: 08/25/2024 14:48     Past medical hx Past Medical History:  Diagnosis Date   Arthritis    Blurred vision    CAD (coronary artery  disease)    a. 2004 PCI: DES to LCX/RCA; b. 03/2008 Cath: patent stents, otw nl cors; c. Mult low risk MVs; d. 05/2022 Cath: LM nl, LAD 40m, LCX 40p/m, OM2 10, RCA 60p, 40p/m ISR, 30d, EF 65%-->Med Rx.   Chronic back pain    COPD (chronic obstructive pulmonary disease) (HCC)    Depression    GERD (gastroesophageal reflux disease)    Gout    Hypertension    IBS (irritable bowel syndrome)    Mixed hyperlipidemia    Sleep apnea    CPAP   Testicular cancer (HCC)    Status post resection and chemotherapy, age early 29s     Social History   Tobacco Use   Smoking status: Every Day    Current packs/day: 1.00    Average packs/day: 1 pack/day for 40.0 years (40.0 ttl pk-yrs)    Types: Cigarettes   Smokeless tobacco: Never   Tobacco comments:    1 pack a day,on a bad day up to 1 pack and 1/2 a day  08/26/2024 KRD  Vaping Use   Vaping status: Former  Substance Use Topics   Alcohol use: No    Alcohol/week: 0.0 standard drinks of alcohol   Drug use: No    Daniel Fields reports that he has been smoking cigarettes. He has a 40 pack-year smoking history. He has never used smokeless tobacco. He reports that he does not drink alcohol and does not use drugs.  Tobacco Cessation: Ready to quit: Not Answered Counseling given: Not Answered Tobacco comments: 1 pack a day,on a bad day up to 1 pack and 1/2 a day  08/26/2024 KRD Current everyday smoker with a 60-pack-year smoking history, in depth smoking history completed by myself at today's visit  Past surgical hx, Family hx, Social hx all reviewed.  Current Outpatient Medications on File Prior to Visit  Medication Sig   allopurinol  (ZYLOPRIM ) 100 MG tablet Take 100 mg by mouth daily.   aspirin  EC 81 MG tablet Take 81 mg by mouth daily.   atorvastatin  (LIPITOR) 20 MG tablet Take 20 mg by mouth at bedtime.   diltiazem  (CARDIZEM  CD) 180 MG 24 hr capsule Take 180 mg by mouth at bedtime.   folic acid (FOLVITE) 1 MG tablet Take 1 mg by mouth at  bedtime.   HYDROcodone -acetaminophen  (NORCO) 7.5-325 MG tablet Take 1 tablet by mouth 4 (four) times daily as needed for moderate pain.   hydrocortisone 2.5 % cream    ketoconazole (NIZORAL) 2 % cream    losartan (COZAAR) 100 MG tablet Take 100 mg by mouth daily.   metoprolol  succinate (TOPROL -XL) 25 MG 24 hr tablet Take  25 mg by mouth at bedtime.   nitroGLYCERIN  (NITROSTAT ) 0.4 MG SL tablet Place 1 tablet (0.4 mg total) under the tongue every 5 (five) minutes as needed.   pantoprazole  (PROTONIX ) 40 MG tablet Take 40 mg by mouth at bedtime.   Potassium Chloride  ER 20 MEQ TBCR Take 20 mEq by mouth daily.   No current facility-administered medications on file prior to visit.     No Known Allergies  Review Of Systems:  Constitutional:   No  weight loss, night sweats,  Fevers, chills, fatigue, or  lassitude.  HEENT:   No headaches,  Difficulty swallowing,  Tooth/dental problems, or  Sore throat,                No sneezing, itching, ear ache, nasal congestion, post nasal drip,   CV:  No chest pain,  Orthopnea, PND, swelling in lower extremities, anasarca, dizziness, palpitations, syncope.   GI  No heartburn, indigestion, abdominal pain, nausea, vomiting, diarrhea, change in bowel habits, loss of appetite, bloody stools.   Resp: No shortness of breath with exertion or at rest.  No excess mucus, no productive cough,  No non-productive cough,  No coughing up of blood.  No change in color of mucus.  No wheezing.  No chest wall deformity  Skin: no rash or lesions.  GU: no dysuria, change in color of urine, no urgency or frequency.  No flank pain, no hematuria   MS:  No joint pain or swelling.  No decreased range of motion.  No back pain.  Psych:  No change in mood or affect. No depression or anxiety.  No memory loss.   Vital Signs BP (!) 147/88   Pulse 62   Temp 98.4 F (36.9 C) (Oral)   Ht 5' 10 (1.778 m)   Wt 220 lb 9.6 oz (100.1 kg)   SpO2 98%   BMI 31.65 kg/m    Physical  Exam:  General- No distress,  A&Ox3, pleasant ENT: No sinus tenderness, TM clear, pale nasal mucosa, no oral exudate,no post nasal drip, no LAN Cardiac: S1, S2, regular rate and rhythm, no murmur Chest: No wheeze/ rales/ dullness; no accessory muscle use, no nasal flaring, no sternal retractions Abd.: Soft Non-tender, nondistended, bowel sounds positive,Body mass index is 31.65 kg/m.  Ext: No clubbing cyanosis, edema, no obvious deformities Neuro:  normal strength, moving all extremities x 4, alert and oriented x 3 Skin: No rashes, warm and dry, no obvious skin lesions Psych: normal mood and behavior Assessment & Plan Right lung pulmonary nodule Nodule decreased from 8 mm to 7.4 mm, reassuring despite previous slow growth. - Schedule follow-up CT scan in six months. - Instruct to call if hemoptysis or unexplained weight loss.  Mild emphysema Mild emphysema noted on CT. No current dyspnea unless with strenuous activity. Previous PFTs in 2016 showed mild ventilatory deficit. - Instruct to call if experiencing dyspnea. - Consider PFTs if respiratory symptoms develop.  Tobacco use disorder Continues to smoke despite advice. Smoking cessation strongly recommended to reduce lung cancer risk.  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 - Monitor hepatic cyst during six-month follow-up CT scan.  Thyroid  nodule, status post benign biopsy Biopsy on July 13, 2024, was benign.  Continued monitoring recommended. - Patient to discuss with Dr. Shona regarding follow-up ultrasound.  AVS 08/26/2024 Your CT chest shows the nodule we are following is slightly smaller.  This is good news. We will do a 6  month follow up which is due 01/2025. You will get a call to get this scheduled closer to the time. Call for any blood in your sputum call to be seen. Call for any unexplained weight loss.  Call if you need us  sooner.  Check  with Dr. Shona about follow up of the thyroid  nodule. We will check on the liver cyst on the 6 month follow up Ct . Please contact office for sooner follow up if symptoms do not improve or worsen or seek emergency care   Please work on quitting smoking. You can receive free nicotine replacement therapy (patches, gum, or mints) by calling 1-800-QUIT NOW. Please call so we can get you on the path to becoming a non-smoker. I know it is hard, but you can do this!  Hypnosis for smoking cessation  [employer] [phone]  Acupuncture for smoking cessation  [employer] [phone]    I spent 20 minutes dedicated to the care of this patient on the date of this encounter to include pre-visit review of records, face-to-face time with the patient discussing conditions above, post visit ordering of testing, clinical documentation with the electronic health record, making appropriate referrals as documented, and communicating necessary information to the patient's healthcare team.      Lauraine JULIANNA Lites, NP 08/26/2024  9:17 AM

## 2024-08-26 NOTE — Patient Instructions (Addendum)
 It is good to see you today. Your CT chest shows the nodule we are following is slightly smaller.  This is good news. We will do a 6 month follow up which is due 01/2025. You will get a call to get this scheduled closer to the time. Call for any blood in your sputum call to be seen. Call for any unexplained weight loss.  Call if you need us  sooner.  Check with Dr. Shona about follow up of the thyroid  nodule. We will check on the liver cyst on the 6 month follow up Ct . Please contact office for sooner follow up if symptoms do not improve or worsen or seek emergency care   Please work on quitting smoking. You can receive free nicotine replacement therapy (patches, gum, or mints) by calling 1-800-QUIT NOW. Please call so we can get you on the path to becoming a non-smoker. I know it is hard, but you can do this!  Hypnosis for smoking cessation  [employer] [phone]  Acupuncture for smoking cessation  [employer] [phone]

## 2024-09-22 DIAGNOSIS — K080 Exfoliation of teeth due to systemic causes: Secondary | ICD-10-CM | POA: Diagnosis not present

## 2024-11-01 ENCOUNTER — Encounter (INDEPENDENT_AMBULATORY_CARE_PROVIDER_SITE_OTHER): Payer: Self-pay | Admitting: *Deleted

## 2024-11-04 DIAGNOSIS — E782 Mixed hyperlipidemia: Secondary | ICD-10-CM | POA: Diagnosis not present

## 2024-11-04 DIAGNOSIS — M109 Gout, unspecified: Secondary | ICD-10-CM | POA: Diagnosis not present

## 2024-11-04 DIAGNOSIS — R7301 Impaired fasting glucose: Secondary | ICD-10-CM | POA: Diagnosis not present

## 2024-11-11 DIAGNOSIS — E782 Mixed hyperlipidemia: Secondary | ICD-10-CM | POA: Diagnosis not present

## 2024-11-11 DIAGNOSIS — Z0000 Encounter for general adult medical examination without abnormal findings: Secondary | ICD-10-CM | POA: Diagnosis not present

## 2024-11-11 DIAGNOSIS — I2510 Atherosclerotic heart disease of native coronary artery without angina pectoris: Secondary | ICD-10-CM | POA: Diagnosis not present

## 2024-11-11 DIAGNOSIS — I10 Essential (primary) hypertension: Secondary | ICD-10-CM | POA: Diagnosis not present

## 2025-02-17 ENCOUNTER — Ambulatory Visit (HOSPITAL_COMMUNITY)
